# Patient Record
Sex: Female | Born: 1945
Health system: Southern US, Community
[De-identification: ages and names within clinical notes are randomized; demographics above are authoritative.]

## PROBLEM LIST (undated history)

## (undated) DIAGNOSIS — I2510 Atherosclerotic heart disease of native coronary artery without angina pectoris: Secondary | ICD-10-CM

## (undated) DIAGNOSIS — I779 Disorder of arteries and arterioles, unspecified: Secondary | ICD-10-CM

## (undated) DIAGNOSIS — T7840XA Allergy, unspecified, initial encounter: Secondary | ICD-10-CM

## (undated) DIAGNOSIS — E785 Hyperlipidemia, unspecified: Secondary | ICD-10-CM

## (undated) DIAGNOSIS — IMO0002 Reserved for concepts with insufficient information to code with codable children: Secondary | ICD-10-CM

## (undated) DIAGNOSIS — H0230 Blepharochalasis unspecified eye, unspecified eyelid: Secondary | ICD-10-CM

## (undated) DIAGNOSIS — L237 Allergic contact dermatitis due to plants, except food: Secondary | ICD-10-CM

## (undated) DIAGNOSIS — R0600 Dyspnea, unspecified: Secondary | ICD-10-CM

## (undated) DIAGNOSIS — I10 Essential (primary) hypertension: Secondary | ICD-10-CM

## (undated) DIAGNOSIS — M8580 Other specified disorders of bone density and structure, unspecified site: Secondary | ICD-10-CM

## (undated) DIAGNOSIS — M1990 Unspecified osteoarthritis, unspecified site: Secondary | ICD-10-CM

## (undated) HISTORY — DX: Hyperlipidemia, unspecified: E78.5

## (undated) HISTORY — DX: Essential (primary) hypertension: I10

## (undated) HISTORY — PX: HERNIA REPAIR: SHX51

## (undated) HISTORY — DX: Allergic contact dermatitis due to plants, except food: L23.7

## (undated) HISTORY — DX: Unspecified osteoarthritis, unspecified site: M19.90

## (undated) HISTORY — DX: Disorder of arteries and arterioles, unspecified: I77.9

## (undated) HISTORY — DX: Atherosclerotic heart disease of native coronary artery without angina pectoris: I25.10

## (undated) HISTORY — DX: Allergy, unspecified, initial encounter: T78.40XA

## (undated) HISTORY — DX: Reserved for concepts with insufficient information to code with codable children: IMO0002

## (undated) HISTORY — DX: Blepharochalasis unspecified eye, unspecified eyelid: H02.30

## (undated) HISTORY — DX: Other specified disorders of bone density and structure, unspecified site: M85.80

---

## 1968-09-09 HISTORY — PX: TONSILLECTOMY: SUR1361

## 1971-09-10 HISTORY — PX: APPENDECTOMY: SHX54

## 1971-09-10 HISTORY — PX: ABDOMINAL HYSTERECTOMY: SHX81

## 1977-09-09 DIAGNOSIS — IMO0002 Reserved for concepts with insufficient information to code with codable children: Secondary | ICD-10-CM

## 1977-09-09 HISTORY — DX: Reserved for concepts with insufficient information to code with codable children: IMO0002

## 1998-09-09 HISTORY — PX: CHOLECYSTECTOMY: SHX55

## 1998-11-19 ENCOUNTER — Encounter: Payer: Self-pay | Admitting: Internal Medicine

## 1998-11-19 ENCOUNTER — Observation Stay (HOSPITAL_COMMUNITY): Admission: EM | Admit: 1998-11-19 | Discharge: 1998-11-21 | Payer: Self-pay | Admitting: Internal Medicine

## 1999-01-10 ENCOUNTER — Other Ambulatory Visit: Admission: RE | Admit: 1999-01-10 | Discharge: 1999-01-10 | Payer: Self-pay | Admitting: *Deleted

## 2000-01-31 ENCOUNTER — Other Ambulatory Visit: Admission: RE | Admit: 2000-01-31 | Discharge: 2000-01-31 | Payer: Self-pay | Admitting: *Deleted

## 2001-02-05 ENCOUNTER — Other Ambulatory Visit: Admission: RE | Admit: 2001-02-05 | Discharge: 2001-02-05 | Payer: Self-pay | Admitting: *Deleted

## 2002-09-09 HISTORY — PX: COLONOSCOPY: SHX174

## 2002-09-09 HISTORY — PX: HAMMER TOE SURGERY: SHX385

## 2002-11-02 ENCOUNTER — Ambulatory Visit (HOSPITAL_COMMUNITY): Admission: RE | Admit: 2002-11-02 | Discharge: 2002-11-02 | Payer: Self-pay | Admitting: Gastroenterology

## 2010-10-11 ENCOUNTER — Ambulatory Visit (INDEPENDENT_AMBULATORY_CARE_PROVIDER_SITE_OTHER): Payer: Medicare PPO | Admitting: Internal Medicine

## 2010-10-11 ENCOUNTER — Encounter: Payer: Self-pay | Admitting: Internal Medicine

## 2010-10-11 VITALS — BP 140/80 | HR 98 | Temp 98.2°F | Ht 61.0 in | Wt 181.0 lb

## 2010-10-11 DIAGNOSIS — Z13220 Encounter for screening for lipoid disorders: Secondary | ICD-10-CM

## 2010-10-11 DIAGNOSIS — Z23 Encounter for immunization: Secondary | ICD-10-CM

## 2010-10-11 DIAGNOSIS — Z79899 Other long term (current) drug therapy: Secondary | ICD-10-CM | POA: Insufficient documentation

## 2010-10-11 DIAGNOSIS — R635 Abnormal weight gain: Secondary | ICD-10-CM

## 2010-10-11 DIAGNOSIS — M1990 Unspecified osteoarthritis, unspecified site: Secondary | ICD-10-CM

## 2010-10-11 DIAGNOSIS — J069 Acute upper respiratory infection, unspecified: Secondary | ICD-10-CM

## 2010-10-11 DIAGNOSIS — Z0000 Encounter for general adult medical examination without abnormal findings: Secondary | ICD-10-CM

## 2010-10-11 LAB — BASIC METABOLIC PANEL
BUN: 15 mg/dL (ref 6–23)
CO2: 24 mEq/L (ref 19–32)
Calcium: 8.7 mg/dL (ref 8.4–10.5)
Chloride: 105 mEq/L (ref 96–112)
Creatinine, Ser: 0.6 mg/dL (ref 0.4–1.2)
GFR: 110.91 mL/min (ref >60.00)
Glucose, Bld: 111 mg/dL — ABNORMAL HIGH (ref 70–99)
Potassium: 4.5 mEq/L (ref 3.5–5.1)
Sodium: 138 mEq/L (ref 135–145)

## 2010-10-11 LAB — CBC WITH DIFFERENTIAL/PLATELET
Basophils Absolute: 0 10*3/uL (ref 0.0–0.1)
Basophils Relative: 0.3 % (ref 0.0–3.0)
Eosinophils Absolute: 0.1 10*3/uL (ref 0.0–0.7)
Eosinophils Relative: 1 % (ref 0.0–5.0)
HCT: 39.5 % (ref 36.0–46.0)
Hemoglobin: 13.5 g/dL (ref 12.0–15.0)
Lymphocytes Relative: 25.1 % (ref 12.0–46.0)
Lymphs Abs: 2.6 10*3/uL (ref 0.7–4.0)
MCHC: 34.2 g/dL (ref 30.0–36.0)
MCV: 91.5 fl (ref 78.0–100.0)
Monocytes Absolute: 0.8 10*3/uL (ref 0.1–1.0)
Monocytes Relative: 7.6 % (ref 3.0–12.0)
Neutro Abs: 6.8 10*3/uL (ref 1.4–7.7)
Neutrophils Relative %: 66 % (ref 43.0–77.0)
Platelets: 251 10*3/uL (ref 150.0–400.0)
RBC: 4.32 Mil/uL (ref 3.87–5.11)
RDW: 12.4 % (ref 11.5–14.6)
WBC: 10.3 10*3/uL (ref 4.5–10.5)

## 2010-10-11 LAB — LIPID PANEL
Total CHOL/HDL Ratio: 3
Triglycerides: 94 mg/dL (ref 0.0–149.0)

## 2010-10-11 LAB — TSH: TSH: 1.33 u[IU]/mL (ref 0.35–5.50)

## 2010-10-11 MED ORDER — AZITHROMYCIN 250 MG PO TABS: 250.0000 mg | ORAL_TABLET | Freq: Every day | ORAL | Status: AC

## 2010-10-11 MED ORDER — PNEUMOCOCCAL VAC POLYVALENT 25 MCG/0.5ML IJ INJ
0.5000 mL | INJECTION | INTRAMUSCULAR | Status: AC | PRN
  Administered 2010-10-11: 0.5 mL via INTRAMUSCULAR

## 2010-10-11 NOTE — Assessment & Plan Note (Signed)
Obtain CBC and chem7

## 2010-10-11 NOTE — Progress Notes (Addendum)
  Subjective:    Patient ID: Megan Pham, female    DOB: 1946-01-23, 65 y.o.   MRN: [id number]  HPI Patient presents to clinic to establish primary medical care and for initial preventive physical exam Medicare. Reviewed past medical history including hand osteo arthritis for which she uses Aleve when necessary. Does relate remote history of PUD with gastric ulcer. Denies abdominal pain hematemesis hematochezia or melena. Reviewed BMI elevated above thirty. Does do low level exercise with walking and has attempted a modified diet some. Previous smoker now reformed. Underwent hysterectomy for noncancerous indication. Has not had a mammogram in over one year. Has never seen a Pneumovax or a bone density. States was told in the past she was hypothyroid and took medication many years ago. Self DC'd the medication. Does note unintended weight gain. Denies depressive symptoms and no outward evidence of depression. Denies falls or disorder balance. Notes 4d h/o cough productive for clear sputum without f/c. +sick exposure.  Reviewed past medical history, social history, family history, current medications and allergies.  Review of Systems  Constitutional: Positive for unexpected weight change. Negative for fever, chills and activity change.  HENT: Positive for rhinorrhea and postnasal drip. Negative for ear pain, neck pain, neck stiffness and ear discharge.   Eyes: Negative for pain, discharge and redness.  Respiratory: Negative for shortness of breath and wheezing.   Musculoskeletal: Positive for arthralgias. Negative for joint swelling and gait problem.  Skin: Negative for color change and rash.  Psychiatric/Behavioral: Negative for behavioral problems and dysphoric mood.       Objective:   Physical Exam  Constitutional: She appears well-developed and well-nourished.  HENT:  Head: Normocephalic and atraumatic.  Eyes: Conjunctivae, EOM and lids are normal. Pupils are equal, round, and reactive  to light. No scleral icterus.  Neck: No JVD present. Carotid bruit is not present. No rigidity. No edema, no erythema and normal range of motion present. No mass and no thyromegaly present.  Cardiovascular: Normal rate, regular rhythm and normal heart sounds.     No systolic murmur is present  Pulmonary/Chest: Effort normal and breath sounds normal. No respiratory distress.  Abdominal: Soft. Normal appearance and bowel sounds are normal. There is no splenomegaly or hepatomegaly. There is no tenderness.  Musculoskeletal:       Right hand: She exhibits normal range of motion and no bony tenderness.  Lymphadenopathy:    She has no cervical adenopathy.  Neurological: She is alert. She has normal strength. Coordination and gait normal.  Skin: Skin is warm and dry. No rash noted. She is not diaphoretic.  Psychiatric: Her speech is normal and behavior is normal. Judgment and thought content normal. Her mood appears not anxious. She does not exhibit a depressed mood.     Hearing grossly intact bilaterally  Bilateral eye 20/25 Left 20/30 Right 20/50    Assessment & Plan:

## 2010-10-11 NOTE — Assessment & Plan Note (Signed)
Suspect viral etiology at this point. Given abx to hold. Begin if sx do not improve after total of 8-10 days. Follow up if no improvement or worsening.

## 2010-10-11 NOTE — Assessment & Plan Note (Signed)
Given h/o PUD discourage regular nsaid use. Use tylenol prn.

## 2010-10-11 NOTE — Assessment & Plan Note (Signed)
Increase exercise and further dietary modification. Obtain TSH

## 2010-10-11 NOTE — Assessment & Plan Note (Signed)
PMH/PSH/FH/SH and medications reviewed. BMI elevated. Recommend increase of aerobic exercise and further dietary modifications. Depression screen unremarkable. No safety/fall issue noted. Discussed end of life planning issue and patient to consider. Preventive care reviewed. Admin pneumovax and schedule mammogram and BMD. EKG obtained demonstrated NSR with nl axis and intervals. No evidence of arrythmia or ischemic change. Encourage to remain off tobacco.

## 2010-10-11 NOTE — Assessment & Plan Note (Signed)
Obtain lipid profile. 

## 2010-10-16 ENCOUNTER — Other Ambulatory Visit: Payer: Self-pay | Admitting: Internal Medicine

## 2010-10-16 DIAGNOSIS — Z1231 Encounter for screening mammogram for malignant neoplasm of breast: Secondary | ICD-10-CM

## 2010-10-17 ENCOUNTER — Other Ambulatory Visit: Payer: Self-pay | Admitting: Internal Medicine

## 2010-10-17 DIAGNOSIS — M1990 Unspecified osteoarthritis, unspecified site: Secondary | ICD-10-CM

## 2010-10-18 ENCOUNTER — Telehealth: Payer: Self-pay

## 2010-10-18 NOTE — Telephone Encounter (Signed)
Pt aware and verbalized understanding.  

## 2010-10-18 NOTE — Telephone Encounter (Signed)
Message copied by Kyung Rudd on Thu Oct 18, 2010  4:35 PM ------      Message from: Letitia Libra, Maisie Fus      Created: Thu Oct 18, 2010  4:08 PM       Blood sugar slightly above nl. (if was fasting). Decrease intake of sugars and carbs and exerise regularly. Other labs nl

## 2010-10-30 ENCOUNTER — Telehealth: Payer: Self-pay | Admitting: Internal Medicine

## 2010-10-30 NOTE — Telephone Encounter (Signed)
Spoke with pt and she is going to [facility] on Thursday at 1 for the bone density and mammogram. Orders to be faxed to [facility] # [phone] fax [phone]

## 2010-10-30 NOTE — Telephone Encounter (Signed)
[facility] to adv they need an order for bone density test / mammogram (to be performed at their facility).... Please send order to [facility].... # [phone] / fax # [phone].

## 2010-10-31 ENCOUNTER — Ambulatory Visit (HOSPITAL_COMMUNITY): Payer: Medicare PPO

## 2010-10-31 ENCOUNTER — Ambulatory Visit (HOSPITAL_COMMUNITY): Admission: RE | Admit: 2010-10-31 | Payer: Medicare PPO | Source: Ambulatory Visit

## 2010-11-01 ENCOUNTER — Ambulatory Visit (HOSPITAL_COMMUNITY)
Admission: RE | Admit: 2010-11-01 | Discharge: 2010-11-01 | Disposition: A | Discharge status: Home / Self Care | Payer: Medicare PPO | Source: Ambulatory Visit | Attending: Internal Medicine | Admitting: Internal Medicine

## 2010-11-01 DIAGNOSIS — Z1231 Encounter for screening mammogram for malignant neoplasm of breast: Secondary | ICD-10-CM

## 2010-11-01 DIAGNOSIS — M1990 Unspecified osteoarthritis, unspecified site: Secondary | ICD-10-CM

## 2010-11-01 DIAGNOSIS — N951 Menopausal and female climacteric states: Secondary | ICD-10-CM | POA: Insufficient documentation

## 2010-11-01 DIAGNOSIS — Z13820 Encounter for screening for osteoporosis: Secondary | ICD-10-CM | POA: Insufficient documentation

## 2010-11-07 ENCOUNTER — Other Ambulatory Visit: Payer: Self-pay | Admitting: Internal Medicine

## 2010-11-07 DIAGNOSIS — R928 Other abnormal and inconclusive findings on diagnostic imaging of breast: Secondary | ICD-10-CM

## 2010-11-23 ENCOUNTER — Telehealth: Payer: Self-pay

## 2010-11-23 NOTE — Telephone Encounter (Signed)
Test concerns??

## 2010-11-28 NOTE — Telephone Encounter (Signed)
appt made

## 2010-11-28 NOTE — Telephone Encounter (Signed)
Returned call to pt. She states that she is concerned with the mammogram results. She was referred to [facility] by Dr. Rodena Medin for a mam and was told that they found something but didn't know what it was. [facility] where she was told the same thing...they saw something but don't know what it is so they want to keep an eye on it by having her return in 6 mo for another look at it. Pt is extremely concerned stating that she told them, "if you don't know what it is, then you also don't know what it's not." Pt would like some type of answer about what is going on. She notes that 6 months is a long time to wait for something like this.

## 2010-11-28 NOTE — Telephone Encounter (Signed)
Need results. Offer appt to discuss

## 2010-12-06 ENCOUNTER — Ambulatory Visit: Payer: Medicare PPO | Admitting: Internal Medicine

## 2010-12-10 ENCOUNTER — Encounter: Payer: Self-pay | Admitting: Internal Medicine

## 2010-12-20 ENCOUNTER — Encounter: Payer: Self-pay | Admitting: Internal Medicine

## 2010-12-20 ENCOUNTER — Ambulatory Visit (INDEPENDENT_AMBULATORY_CARE_PROVIDER_SITE_OTHER): Payer: Medicare PPO | Admitting: Internal Medicine

## 2010-12-20 VITALS — BP 134/80 | HR 105 | Temp 98.3°F | Wt 178.5 lb

## 2010-12-20 DIAGNOSIS — R928 Other abnormal and inconclusive findings on diagnostic imaging of breast: Secondary | ICD-10-CM

## 2010-12-20 DIAGNOSIS — R29898 Other symptoms and signs involving the musculoskeletal system: Secondary | ICD-10-CM

## 2010-12-20 DIAGNOSIS — M6281 Muscle weakness (generalized): Secondary | ICD-10-CM

## 2010-12-20 DIAGNOSIS — R202 Paresthesia of skin: Secondary | ICD-10-CM

## 2010-12-20 DIAGNOSIS — R209 Unspecified disturbances of skin sensation: Secondary | ICD-10-CM

## 2010-12-23 ENCOUNTER — Encounter: Payer: Self-pay | Admitting: Internal Medicine

## 2010-12-23 DIAGNOSIS — R29898 Other symptoms and signs involving the musculoskeletal system: Secondary | ICD-10-CM | POA: Insufficient documentation

## 2010-12-23 DIAGNOSIS — R928 Other abnormal and inconclusive findings on diagnostic imaging of breast: Secondary | ICD-10-CM | POA: Insufficient documentation

## 2010-12-23 NOTE — Progress Notes (Signed)
  Subjective:    Patient ID: Megan Pham, female    DOB: April 22, 1946, 65 y.o.   MRN: [id number]  HPI Pt presents to clinic for evaluation of abnormal mammogram and right hand discomfort.  Recently underwent abn screening mammogram resulting in diagnostic mammogram and [place]. Results reviewed with patient in detail. No mass was identified and the area of interest was felt to be an asymmetric density of the right breast. Pt denies palpable breast mass and performs exams regularly. Final impression was probably benign birads 3 with recommended 6 month followup. Pt also notes intermittent right hand paresthesias involving 4/5 phalanges. C/o diminished strength in that hand. Recalls remote trauma of right arm and was told in the past had possible pinched nerve of the arm. Denies neck pain or radicular pain.    Reviewed pmh, medications and allergies.     Review of Systems see hpi     Objective:   Physical Exam  Nursing note and vitals reviewed. Constitutional: She appears well-developed and well-nourished. No distress.  HENT:  Head: Normocephalic and atraumatic.  Musculoskeletal: Normal range of motion. She exhibits no tenderness.  Neurological: She is alert.       FROM right arm and hand. No hand muscle wasting. Neg phalens. MCP and intertriginous muscle strength 5/5  Skin: Skin is warm and dry. She is not diaphoretic.          Assessment & Plan:

## 2010-12-23 NOTE — Assessment & Plan Note (Signed)
Subjective weakness not reproduced on exam and associated with paresthesias. ?ulnar nerve involvement. Neurology consult for consideration of NCS/EMG.

## 2010-12-23 NOTE — Assessment & Plan Note (Signed)
Discussed and reviewed in detail with patient. Recommend 85month f/u mammogram and monthly self breast exams.

## 2010-12-28 ENCOUNTER — Telehealth: Payer: Self-pay

## 2010-12-28 NOTE — Telephone Encounter (Signed)
Spoke with pt about bone density report. Per Dr. Rodena Medin, pt has osteopenia or mild bone loss, therefore he recommends she take calcium 1200 units qd and vit d 1000 units qd. Pt is aware and verbalized understanding.

## 2011-01-28 ENCOUNTER — Telehealth: Payer: Self-pay | Admitting: Internal Medicine

## 2011-01-28 NOTE — Telephone Encounter (Signed)
Pt has poison ivy.Pt just had eye surgery and needs to get a script for ointment, cream or med called in to [facility] in [place] asap today. Pt would like to be notified when this has been taken care of.

## 2011-01-28 NOTE — Telephone Encounter (Signed)
Triamcinolone to affected area bid prn #30 gm. Do not apply to eyes or mouth.

## 2011-01-29 ENCOUNTER — Other Ambulatory Visit: Payer: Self-pay

## 2011-01-29 DIAGNOSIS — L237 Allergic contact dermatitis due to plants, except food: Secondary | ICD-10-CM

## 2011-01-29 MED ORDER — TRIAMCINOLONE ACETONIDE 0.025 % EX OINT: TOPICAL_OINTMENT | CUTANEOUS | Status: DC

## 2011-01-29 NOTE — Telephone Encounter (Signed)
done

## 2011-02-15 ENCOUNTER — Encounter: Payer: Self-pay | Admitting: Internal Medicine

## 2011-04-01 ENCOUNTER — Encounter: Payer: Self-pay | Admitting: Internal Medicine

## 2011-04-01 ENCOUNTER — Ambulatory Visit (INDEPENDENT_AMBULATORY_CARE_PROVIDER_SITE_OTHER): Payer: Medicare PPO | Admitting: Internal Medicine

## 2011-04-01 VITALS — BP 110/70 | Temp 98.2°F | Wt 180.0 lb

## 2011-04-01 DIAGNOSIS — L259 Unspecified contact dermatitis, unspecified cause: Secondary | ICD-10-CM

## 2011-04-01 MED ORDER — METHYLPREDNISOLONE ACETATE 80 MG/ML IJ SUSP
80.0000 mg | Freq: Once | INTRAMUSCULAR | Status: AC
  Administered 2011-04-01: 80 mg via INTRAMUSCULAR

## 2011-04-01 NOTE — Progress Notes (Signed)
  Subjective:    Patient ID: Megan Pham, female    DOB: 12/30/1945, 65 y.o.   MRN: [id number]  HPI  65 year old patient who has a history of being extremely sensitive to poison ivy. She presents with a rash quite pruritic involving extremities trunk and upper back after doing some yard work. It is very similar to her episodes of contact dermatitis in the past. She's been using topical triamcinolone without much benefit. She always responded nicely to injections however.    Review of Systems  Skin: Positive for rash.       Objective:   Physical Exam  Skin:       Scattered erythematous slightly raised plaques over the arms left breast anterior chest and back          Assessment & Plan:   Contact dermatitis. Will treat with Depo-Medrol 80 mg IM and clinically observed. This has always been quite helpful she'll use Benadryl when necessary

## 2011-04-01 NOTE — Patient Instructions (Signed)
Benadryl as needed for itching  Call or return to clinic prn if these symptoms worsen or fail to improve as anticipated.

## 2011-04-01 NOTE — Progress Notes (Signed)
Addended by: Duard Brady I on: 04/01/2011 04:51 PM   Modules accepted: Orders

## 2011-04-09 ENCOUNTER — Ambulatory Visit (INDEPENDENT_AMBULATORY_CARE_PROVIDER_SITE_OTHER): Payer: Medicare PPO | Admitting: Internal Medicine

## 2011-04-09 ENCOUNTER — Encounter: Payer: Self-pay | Admitting: Internal Medicine

## 2011-04-09 VITALS — BP 120/80 | Temp 98.0°F | Wt 178.0 lb

## 2011-04-09 DIAGNOSIS — L259 Unspecified contact dermatitis, unspecified cause: Secondary | ICD-10-CM

## 2011-04-09 MED ORDER — PREDNISONE 10 MG PO KIT: 10.0000 mg | PACK | Freq: Two times a day (BID) | ORAL | Status: DC

## 2011-04-09 NOTE — Progress Notes (Signed)
  Subjective:    Patient ID: Megan Pham, female    DOB: May 12, 1946, 65 y.o.   MRN: [id number]  HPI  65 year old patient who is highly allergic to poison ivy. She was treated recently for a contact dermatitis with Depo-Medrol. She is on a number of topical regimens and that continues to be quite symptomatic.    Review of Systems  Skin: Positive for rash.       Objective:   Physical Exam  Constitutional: She appears well-developed and well-nourished. No distress.  Skin:       Patchy areas of the erythema scaling. There is some dry crusted lesions scattered over the abdominal wall area          Assessment & Plan:   No problem-specific assessment & plan notes found for this encounter.   Slowly resolving contact dermatitis. We'll treat with a slow taper of a prednisone Dosepak over the next 12 days

## 2011-04-09 NOTE — Patient Instructions (Signed)
Prednisone Dosepak as directed  Call or return to clinic prn if these symptoms worsen or fail to improve as anticipated.

## 2011-05-21 LAB — HM MAMMOGRAPHY

## 2011-05-22 ENCOUNTER — Encounter: Payer: Self-pay | Admitting: Internal Medicine

## 2011-07-15 ENCOUNTER — Telehealth: Payer: Self-pay | Admitting: Internal Medicine

## 2011-07-15 NOTE — Telephone Encounter (Signed)
Pt would like rx fax to [phone]) for shingles vaccine.

## 2011-07-15 NOTE — Telephone Encounter (Signed)
Done and faxed

## 2011-11-05 ENCOUNTER — Encounter: Payer: Self-pay | Admitting: Internal Medicine

## 2012-02-11 ENCOUNTER — Encounter: Payer: Self-pay | Admitting: Internal Medicine

## 2012-02-11 ENCOUNTER — Ambulatory Visit (INDEPENDENT_AMBULATORY_CARE_PROVIDER_SITE_OTHER): Payer: BC Managed Care – PPO | Admitting: Internal Medicine

## 2012-02-11 VITALS — BP 130/80 | Temp 98.0°F | Wt 172.0 lb

## 2012-02-11 DIAGNOSIS — J069 Acute upper respiratory infection, unspecified: Secondary | ICD-10-CM

## 2012-02-11 DIAGNOSIS — Z8701 Personal history of pneumonia (recurrent): Secondary | ICD-10-CM

## 2012-02-11 NOTE — Patient Instructions (Signed)
It is important that you exercise regularly, at least 20 minutes 3 to 4 times per week.  If you develop chest pain or shortness of breath seek  medical attention.  Call or return to clinic prn if these symptoms worsen or fail to improve as anticipated.  Annual exam as scheduled

## 2012-02-11 NOTE — Progress Notes (Signed)
  Subjective:    Patient ID: Megan Pham, female    DOB: 1946-05-22, 66 y.o.   MRN: [id number]  HPI  66 year old patient who is seen today in followup. While visiting in [facility] she was evaluated and treated for community-acquired pneumonia. She was seen on May 23 and was treated with Levaquin. Today she feels much improved and has completed antibiotic therapy. Only complaint is some mild fatigue and weakness but she seems to be improving. She's had no further cough or fever. Her appetite and by mouth intake has normalized the right ear the level is still diminished.    Review of Systems  Constitutional: Positive for fatigue.  HENT: Negative for hearing loss, congestion, sore throat, rhinorrhea, dental problem, sinus pressure and tinnitus.   Eyes: Negative for pain, discharge and visual disturbance.  Respiratory: Negative for cough and shortness of breath.   Cardiovascular: Negative for chest pain, palpitations and leg swelling.  Gastrointestinal: Negative for nausea, vomiting, abdominal pain, diarrhea, constipation, blood in stool and abdominal distention.  Genitourinary: Negative for dysuria, urgency, frequency, hematuria, flank pain, vaginal bleeding, vaginal discharge, difficulty urinating, vaginal pain and pelvic pain.  Musculoskeletal: Negative for joint swelling, arthralgias and gait problem.  Skin: Negative for rash.  Neurological: Positive for weakness. Negative for dizziness, syncope, speech difficulty, numbness and headaches.  Hematological: Negative for adenopathy.  Psychiatric/Behavioral: Negative for behavioral problems, dysphoric mood and agitation. The patient is not nervous/anxious.        Objective:   Physical Exam  Constitutional: She is oriented to person, place, and time. She appears well-developed and well-nourished.       Obese. No distress. Afebrile. Blood pressure 130/80  HENT:  Head: Normocephalic.  Right Ear: External ear normal.  Left Ear:  External ear normal.  Mouth/Throat: Oropharynx is clear and moist.  Eyes: Conjunctivae and EOM are normal. Pupils are equal, round, and reactive to light.  Neck: Normal range of motion. Neck supple. No thyromegaly present.  Cardiovascular: Normal rate, regular rhythm, normal heart sounds and intact distal pulses.   Pulmonary/Chest: Effort normal and breath sounds normal. No respiratory distress. She has no wheezes. She has no rales. She exhibits no tenderness.       Oxygen saturation 97% Pulse rate 66  Abdominal: Soft. Bowel sounds are normal. She exhibits no mass. There is no tenderness.  Musculoskeletal: Normal range of motion.  Lymphadenopathy:    She has no cervical adenopathy.  Neurological: She is alert and oriented to person, place, and time.  Skin: Skin is warm and dry. No rash noted.  Psychiatric: She has a normal mood and affect. Her behavior is normal.          Assessment & Plan:   History of community-acquired pneumonia. Patient clinically looks quite well her chest is clear Have encouraged patient to slowly resume her usual activities. We'll see as scheduled for her annual exam

## 2012-03-06 ENCOUNTER — Encounter: Payer: Self-pay | Admitting: Internal Medicine

## 2012-03-06 ENCOUNTER — Ambulatory Visit (INDEPENDENT_AMBULATORY_CARE_PROVIDER_SITE_OTHER): Payer: BC Managed Care – PPO | Admitting: Internal Medicine

## 2012-03-06 VITALS — BP 140/74 | Temp 98.4°F | Wt 171.0 lb

## 2012-03-06 DIAGNOSIS — L259 Unspecified contact dermatitis, unspecified cause: Secondary | ICD-10-CM

## 2012-03-06 MED ORDER — TRIAMCINOLONE ACETONIDE 0.1 % EX CREA: TOPICAL_CREAM | Freq: Two times a day (BID) | CUTANEOUS | Status: AC

## 2012-03-06 MED ORDER — PREDNISONE 10 MG PO TABS: ORAL_TABLET | ORAL | Status: DC

## 2012-03-06 MED ORDER — METHYLPREDNISOLONE ACETATE 80 MG/ML IJ SUSP
80.0000 mg | Freq: Once | INTRAMUSCULAR | Status: AC
  Administered 2012-03-06: 80 mg via INTRAMUSCULAR

## 2012-03-06 NOTE — Patient Instructions (Addendum)
Poison Ivy Poison ivy is a inflammation of the skin (contact dermatitis) caused by touching the allergens on the leaves of the ivy plant following previous exposure to the plant. The rash usually appears 48 hours after exposure. The rash is usually bumps (papules) or blisters (vesicles) in a linear pattern. Depending on your own sensitivity, the rash may simply cause redness and itching, or it may also progress to blisters which may break open. These must be well cared for to prevent secondary bacterial (germ) infection, followed by scarring. Keep any open areas dry, clean, dressed, and covered with an antibacterial ointment if needed. The eyes may also get puffy. The puffiness is worst in the morning and gets better as the day progresses. This dermatitis usually heals without scarring, within 2 to 3 weeks without treatment. HOME CARE INSTRUCTIONS  Thoroughly wash with soap and water as soon as you have been exposed to poison ivy. You have about one half hour to remove the plant resin before it will cause the rash. This washing will destroy the oil or antigen on the skin that is causing, or will cause, the rash. Be sure to wash under your fingernails as any plant resin there will continue to spread the rash. Do not rub skin vigorously when washing affected area. Poison ivy cannot spread if no oil from the plant remains on your body. A rash that has progressed to weeping sores will not spread the rash unless you have not washed thoroughly. It is also important to wash any clothes you have been wearing as these may carry active allergens. The rash will return if you wear the unwashed clothing, even several days later. Avoidance of the plant in the future is the best measure. Poison ivy plant can be recognized by the number of leaves. Generally, poison ivy has three leaves with flowering branches on a single stem. Diphenhydramine may be purchased over the counter and used as needed for itching. Do not drive with  this medication if it makes you drowsy.Ask your caregiver about medication for children. SEEK MEDICAL CARE IF:  Open sores develop.   Redness spreads beyond area of rash.   You notice purulent (pus-like) discharge.   You have increased pain.   Other signs of infection develop (such as fever).  Document Released: 08/23/2000 Document Revised: 08/15/2011 Document Reviewed: 07/12/2009 [employer] Patient Information 2012 [employer]. 

## 2012-03-06 NOTE — Progress Notes (Signed)
  Subjective:    Patient ID: [name], female    DOB: Nov 06, 1945, 66 y.o.   MRN: [id number]  HPI  66 year old patient who has the onset of patchy very pruritic areas of dermatitis involving mainly extremities and abdominal wall. Onset after bathing her dog. Patient has had poison ivy in the past and this rash is quite similar    Review of Systems  Skin: Positive for rash.       Objective:   Physical Exam  Skin: Rash noted.       Patchy areas of erythema with some vesicles over the abdominal wall and extremities          Assessment & Plan:   Content dermatitis. Will treat with Depo-Medrol followed by oral prednisone.

## 2012-06-12 ENCOUNTER — Ambulatory Visit (INDEPENDENT_AMBULATORY_CARE_PROVIDER_SITE_OTHER): Payer: BC Managed Care – PPO | Admitting: Internal Medicine

## 2012-06-12 ENCOUNTER — Encounter: Payer: Self-pay | Admitting: Internal Medicine

## 2012-06-12 VITALS — BP 140/90 | Temp 98.1°F | Wt 174.0 lb

## 2012-06-12 DIAGNOSIS — T7840XA Allergy, unspecified, initial encounter: Secondary | ICD-10-CM

## 2012-06-12 DIAGNOSIS — Z888 Allergy status to other drugs, medicaments and biological substances status: Secondary | ICD-10-CM

## 2012-06-12 NOTE — Patient Instructions (Signed)
You  may move around, but avoid painful motions and activities.  Apply ice to the sore area for 15 to 20 minutes 3 or 4 times daily for the next two to 3 days.  Celebrex  200 mg daily  Take [phone]  mg of Tylenol every 6 hours as needed for pain relief or fever.  Avoid taking more than 3000 mg in a 24-hour period (  This may cause liver damage).

## 2012-06-12 NOTE — Progress Notes (Signed)
  Subjective:    Patient ID: Megan Pham, female    DOB: October 23, 1945, 66 y.o.   MRN: [id number]  HPI  66 year old patient who is seen today for flu vaccine at a local medical clinic 2 days ago. Shortly thereafter she began having pain redness and swelling in the right upper arm close to the injection site. She has had some diarrhea achiness in general sense of unwellness.  Past Medical History  Diagnosis Date  . Arthritis   . Ulcer     ulcerative colitis    History   Social History  . Marital Status: Married    Spouse Name: N/A    Number of Children: N/A  . Years of Education: N/A   Occupational History  . retired    Social History Main Topics  . Smoking status: Former Smoker    Quit date: 09/09/2006  . Smokeless tobacco: Not on file  . Alcohol Use: No  . Drug Use: No  . Sexually Active:    Other Topics Concern  . Not on file   Social History Narrative  . No narrative on file    Past Surgical History  Procedure Date  . Cholecystectomy 2000  . Appendectomy 1973  . Tonsillectomy 1970  . Abdominal hysterectomy 1973  . Hammer toe surgery 2004    Family History  Problem Relation Age of Onset  . Arthritis Mother   . Cancer Mother     lung  . Arthritis Father   . Cancer Father     lung  . Arthritis Maternal Aunt   . Cancer Maternal Aunt     breast  . Arthritis Maternal Uncle   . Arthritis Paternal Aunt   . Arthritis Paternal Uncle   . Arthritis Maternal Grandmother   . Arthritis Maternal Grandfather   . Arthritis Paternal Grandmother   . Cancer Paternal Grandmother     breast  . Arthritis Paternal Grandfather     Allergies  Allergen Reactions  . Cefoxitin Sodium In Dextrose Hives    Current Outpatient Prescriptions on File Prior to Visit  Medication Sig Dispense Refill  . cyanocobalamin 2000 MCG tablet Take 2,000 mcg by mouth daily.        . Multiple Vitamin (MULTIVITAMIN) capsule Take 1 capsule by mouth daily.        Marland Kitchen triamcinolone cream  (KENALOG) 0.1 % Apply topically 2 (two) times daily.  30 g  0    BP 140/90  Temp 98.1 F (36.7 C) (Oral)  Wt 174 lb (78.926 kg)       Review of Systems  Constitutional: Negative for fatigue.  Gastrointestinal: Positive for diarrhea.  Neurological: Positive for weakness.       Objective:   Physical Exam  Constitutional: She appears well-developed and well-nourished. No distress.  Skin: Rash noted.       8 cm patchy area of erythema with excess of warmth and tenderness involving her left upper arm area over the biceps muscle. This appeared to be just medial to the injection site of her flu vaccine          Assessment & Plan:   Local allergic reaction to the flu vaccine. Will treat with Tylenol ice elevation and Celebrex Will call there is any clinical deterioration

## 2013-02-19 ENCOUNTER — Encounter: Payer: Self-pay | Admitting: Family Medicine

## 2013-02-19 ENCOUNTER — Ambulatory Visit (INDEPENDENT_AMBULATORY_CARE_PROVIDER_SITE_OTHER): Payer: Medicare Other | Admitting: Family Medicine

## 2013-02-19 VITALS — BP 110/80 | Temp 98.2°F | Wt 178.0 lb

## 2013-02-19 DIAGNOSIS — L259 Unspecified contact dermatitis, unspecified cause: Secondary | ICD-10-CM

## 2013-02-19 DIAGNOSIS — L309 Dermatitis, unspecified: Secondary | ICD-10-CM

## 2013-02-19 MED ORDER — PREDNISONE 20 MG PO TABS: ORAL_TABLET | ORAL | Status: DC

## 2013-02-19 NOTE — Progress Notes (Signed)
Chief Complaint  Patient presents with  . Poison Ivy    HPI:  Skin rash: -thinks poison oak - she has is -she has itchy spots on skin on legs and arms -she has a dog -she was weeding the other day  ROS: See pertinent positives and negatives per HPI.  Past Medical History  Diagnosis Date  . Arthritis   . Ulcer     ulcerative colitis    Family History  Problem Relation Age of Onset  . Arthritis Mother   . Cancer Mother     lung  . Arthritis Father   . Cancer Father     lung  . Arthritis Maternal Aunt   . Cancer Maternal Aunt     breast  . Arthritis Maternal Uncle   . Arthritis Paternal Aunt   . Arthritis Paternal Uncle   . Arthritis Maternal Grandmother   . Arthritis Maternal Grandfather   . Arthritis Paternal Grandmother   . Cancer Paternal Grandmother     breast  . Arthritis Paternal Grandfather     History   Social History  . Marital Status: Married    Spouse Name: N/A    Number of Children: N/A  . Years of Education: N/A   Occupational History  . retired    Social History Main Topics  . Smoking status: Former Smoker    Quit date: 09/09/2006  . Smokeless tobacco: None  . Alcohol Use: No  . Drug Use: No  . Sexually Active:    Other Topics Concern  . None   Social History Narrative  . None    Current outpatient prescriptions:cyanocobalamin 2000 MCG tablet, Take 2,000 mcg by mouth daily.  , Disp: , Rfl: ;  Multiple Vitamin (MULTIVITAMIN) capsule, Take 1 capsule by mouth daily.  , Disp: , Rfl: ;  triamcinolone cream (KENALOG) 0.1 %, Apply topically 2 (two) times daily., Disp: 30 g, Rfl: 0;  predniSONE (DELTASONE) 20 MG tablet, 40 mg daily for 3 days, then 20mg  daily for 3 days then stop, Disp: 9 tablet, Rfl: 0  EXAM:  Filed Vitals:   02/19/13 1334  BP: 110/80  Temp: 98.2 F (36.8 C)    Body mass index is 33.65 kg/(m^2).  GENERAL: vitals reviewed and listed above, alert, oriented, appears well hydrated and in no acute distress  HEENT:  atraumatic, conjunttiva clear, no obvious abnormalities on inspection of external nose and ears  NECK: no obvious masses on inspection  SKIN: few scattered papules on skin - no vesicles  MS: moves all extremities without noticeable abnormality  PSYCH: pleasant and cooperative, no obvious depression or anxiety  ASSESSMENT AND PLAN:  Discussed the following assessment and plan:  Dermatitis - Plan: predniSONE (DELTASONE) 20 MG tablet  -appears to be insect bites - does NOT appear to be poison ivy/oak dermatitis. However pt is adamant she gets this every year and that it is poison ivy and demands steroids.  -discussed more likely insect bite and advised to look for fleas in home, prevention of bug bites when outside, topical steroid -rx for prednisone at her insistence for use if worsens. If worsens should see doctor and call exterminator. Discuss risks. -Patient advised to return or notify a doctor immediately if symptoms worsen or persist or new concerns arise.  There are no Patient Instructions on file for this visit.   Kriste Basque R.

## 2013-02-19 NOTE — Patient Instructions (Signed)
-  inspect home for fleas and insect  -call exterminator if continued problems

## 2013-06-01 ENCOUNTER — Ambulatory Visit: Payer: Medicare Other

## 2013-10-14 ENCOUNTER — Ambulatory Visit (INDEPENDENT_AMBULATORY_CARE_PROVIDER_SITE_OTHER): Payer: Medicare HMO | Admitting: Internal Medicine

## 2013-10-14 ENCOUNTER — Ambulatory Visit: Payer: Medicare Other | Admitting: Internal Medicine

## 2013-10-14 ENCOUNTER — Encounter: Payer: Self-pay | Admitting: Internal Medicine

## 2013-10-14 VITALS — BP 140/84 | HR 86 | Temp 97.9°F | Resp 20 | Ht 60.5 in | Wt 182.0 lb

## 2013-10-14 DIAGNOSIS — Z0000 Encounter for general adult medical examination without abnormal findings: Secondary | ICD-10-CM

## 2013-10-14 DIAGNOSIS — R635 Abnormal weight gain: Secondary | ICD-10-CM

## 2013-10-14 DIAGNOSIS — Z136 Encounter for screening for cardiovascular disorders: Secondary | ICD-10-CM

## 2013-10-14 DIAGNOSIS — R928 Other abnormal and inconclusive findings on diagnostic imaging of breast: Secondary | ICD-10-CM

## 2013-10-14 DIAGNOSIS — M1990 Unspecified osteoarthritis, unspecified site: Secondary | ICD-10-CM

## 2013-10-14 LAB — LIPID PANEL
CHOL/HDL RATIO: 4
CHOLESTEROL: 260 mg/dL — AB (ref 0–200)
HDL: 60.1 mg/dL (ref >39.00)
TRIGLYCERIDES: 136 mg/dL (ref 0.0–149.0)
VLDL: 27.2 mg/dL (ref 0.0–40.0)

## 2013-10-14 LAB — CBC WITH DIFFERENTIAL/PLATELET
BASOS ABS: 0 10*3/uL (ref 0.0–0.1)
Basophils Relative: 0.4 % (ref 0.0–3.0)
EOS ABS: 0 10*3/uL (ref 0.0–0.7)
Eosinophils Relative: 0.5 % (ref 0.0–5.0)
HEMATOCRIT: 43.7 % (ref 36.0–46.0)
HEMOGLOBIN: 14.4 g/dL (ref 12.0–15.0)
LYMPHS ABS: 3.8 10*3/uL (ref 0.7–4.0)
Lymphocytes Relative: 48.3 % — ABNORMAL HIGH (ref 12.0–46.0)
MCHC: 33 g/dL (ref 30.0–36.0)
MCV: 92.7 fl (ref 78.0–100.0)
MONO ABS: 0.5 10*3/uL (ref 0.1–1.0)
MONOS PCT: 6.7 % (ref 3.0–12.0)
NEUTROS ABS: 3.4 10*3/uL (ref 1.4–7.7)
Neutrophils Relative %: 44.1 % (ref 43.0–77.0)
Platelets: 320 10*3/uL (ref 150.0–400.0)
RBC: 4.71 Mil/uL (ref 3.87–5.11)
RDW: 12.3 % (ref 11.5–14.6)
WBC: 7.8 10*3/uL (ref 4.5–10.5)

## 2013-10-14 LAB — COMPREHENSIVE METABOLIC PANEL
ALK PHOS: 112 U/L (ref 39–117)
ALT: 42 U/L — AB (ref 0–35)
AST: 40 U/L — AB (ref 0–37)
Albumin: 4.6 g/dL (ref 3.5–5.2)
BILIRUBIN TOTAL: 0.7 mg/dL (ref 0.3–1.2)
BUN: 10 mg/dL (ref 6–23)
CO2: 27 mEq/L (ref 19–32)
CREATININE: 0.6 mg/dL (ref 0.4–1.2)
Calcium: 9.7 mg/dL (ref 8.4–10.5)
Chloride: 102 mEq/L (ref 96–112)
GFR: 101.76 mL/min (ref >60.00)
Glucose, Bld: 99 mg/dL (ref 70–99)
Potassium: 4.5 mEq/L (ref 3.5–5.1)
SODIUM: 137 meq/L (ref 135–145)
Total Protein: 8 g/dL (ref 6.0–8.3)

## 2013-10-14 LAB — TSH: TSH: 0.33 u[IU]/mL — ABNORMAL LOW (ref 0.35–5.50)

## 2013-10-14 NOTE — Progress Notes (Signed)
Pre-visit discussion using our clinic review tool. No additional management support is needed unless otherwise documented below in the visit note.  

## 2013-10-14 NOTE — Patient Instructions (Signed)
Limit your sodium (Salt) intake    It is important that you exercise regularly, at least 20 minutes 3 to 4 times per week.  If you develop chest pain or shortness of breath seek  medical attention.  You need to lose weight.  Consider a lower calorie diet and regular exercise.Health Maintenance, Female A healthy lifestyle and preventative care can promote health and wellness.  Maintain regular health, dental, and eye exams.  Eat a healthy diet. Foods like vegetables, fruits, whole grains, low-fat dairy products, and lean protein foods contain the nutrients you need without too many calories. Decrease your intake of foods high in solid fats, added sugars, and salt. Get information about a proper diet from your caregiver, if necessary.  Regular physical exercise is one of the most important things you can do for your health. Most adults should get at least 150 minutes of moderate-intensity exercise (any activity that increases your heart rate and causes you to sweat) each week. In addition, most adults need muscle-strengthening exercises on 2 or more days a week.   Maintain a healthy weight. The body mass index (BMI) is a screening tool to identify possible weight problems. It provides an estimate of body fat based on height and weight. Your caregiver can help determine your BMI, and can help you achieve or maintain a healthy weight. For adults 20 years and older:  A BMI below 18.5 is considered underweight.  A BMI of 18.5 to 24.9 is normal.  A BMI of 25 to 29.9 is considered overweight.  A BMI of 30 and above is considered obese.  Maintain normal blood lipids and cholesterol by exercising and minimizing your intake of saturated fat. Eat a balanced diet with plenty of fruits and vegetables. Blood tests for lipids and cholesterol should begin at age 1 and be repeated every 5 years. If your lipid or cholesterol levels are high, you are over 50, or you are a high risk for heart disease, you may  need your cholesterol levels checked more frequently.Ongoing high lipid and cholesterol levels should be treated with medicines if diet and exercise are not effective.  If you smoke, find out from your caregiver how to quit. If you do not use tobacco, do not start.  Lung cancer screening is recommended for adults aged 69 80 years who are at high risk for developing lung cancer because of a history of smoking. Yearly low-dose computed tomography (CT) is recommended for people who have at least a 30-pack-year history of smoking and are a current smoker or have quit within the past 15 years. A pack year of smoking is smoking an average of 1 pack of cigarettes a day for 1 year (for example: 1 pack a day for 30 years or 2 packs a day for 15 years). Yearly screening should continue until the smoker has stopped smoking for at least 15 years. Yearly screening should also be stopped for people who develop a health problem that would prevent them from having lung cancer treatment.  If you are pregnant, do not drink alcohol. If you are breastfeeding, be very cautious about drinking alcohol. If you are not pregnant and choose to drink alcohol, do not exceed 1 drink per day. One drink is considered to be 12 ounces (355 mL) of beer, 5 ounces (148 mL) of wine, or 1.5 ounces (44 mL) of liquor.  Avoid use of street drugs. Do not share needles with anyone. Ask for help if you need support or instructions about  stopping the use of drugs.  High blood pressure causes heart disease and increases the risk of stroke. Blood pressure should be checked at least every 1 to 2 years. Ongoing high blood pressure should be treated with medicines, if weight loss and exercise are not effective.  If you are 47 to 68 years old, ask your caregiver if you should take aspirin to prevent strokes.  Diabetes screening involves taking a blood sample to check your fasting blood sugar level. This should be done once every 3 years, after age 53,  if you are within normal weight and without risk factors for diabetes. Testing should be considered at a younger age or be carried out more frequently if you are overweight and have at least 1 risk factor for diabetes.  Breast cancer screening is essential preventative care for women. You should practice "breast self-awareness." This means understanding the normal appearance and feel of your breasts and may include breast self-examination. Any changes detected, no matter how small, should be reported to a caregiver. Women in their 34s and 30s should have a clinical breast exam (CBE) by a caregiver as part of a regular health exam every 1 to 3 years. After age 46, women should have a CBE every year. Starting at age 80, women should consider having a mammogram (breast X-ray) every year. Women who have a family history of breast cancer should talk to their caregiver about genetic screening. Women at a high risk of breast cancer should talk to their caregiver about having an MRI and a mammogram every year.  Breast cancer gene (BRCA)-related cancer risk assessment is recommended for women who have family members with BRCA-related cancers. BRCA-related cancers include breast, ovarian, tubal, and peritoneal cancers. Having family members with these cancers may be associated with an increased risk for harmful changes (mutations) in the breast cancer genes BRCA1 and BRCA2. Results of the assessment will determine the need for genetic counseling and BRCA1 and BRCA2 testing.  The Pap test is a screening test for cervical cancer. Women should have a Pap test starting at age 8. Between ages 66 and 70, Pap tests should be repeated every 2 years. Beginning at age 47, you should have a Pap test every 3 years as long as the past 3 Pap tests have been normal. If you had a hysterectomy for a problem that was not cancer or a condition that could lead to cancer, then you no longer need Pap tests. If you are between ages 61 and  65, and you have had normal Pap tests going back 10 years, you no longer need Pap tests. If you have had past treatment for cervical cancer or a condition that could lead to cancer, you need Pap tests and screening for cancer for at least 20 years after your treatment. If Pap tests have been discontinued, risk factors (such as a new sexual partner) need to be reassessed to determine if screening should be resumed. Some women have medical problems that increase the chance of getting cervical cancer. In these cases, your caregiver may recommend more frequent screening and Pap tests.  The human papillomavirus (HPV) test is an additional test that may be used for cervical cancer screening. The HPV test looks for the virus that can cause the cell changes on the cervix. The cells collected during the Pap test can be tested for HPV. The HPV test could be used to screen women aged 18 years and older, and should be used in women of any  age who have unclear Pap test results. After the age of 50, women should have HPV testing at the same frequency as a Pap test.  Colorectal cancer can be detected and often prevented. Most routine colorectal cancer screening begins at the age of 28 and continues through age 47. However, your caregiver may recommend screening at an earlier age if you have risk factors for colon cancer. On a yearly basis, your caregiver may provide home test kits to check for hidden blood in the stool. Use of a small camera at the end of a tube, to directly examine the colon (sigmoidoscopy or colonoscopy), can detect the earliest forms of colorectal cancer. Talk to your caregiver about this at age 17, when routine screening begins. Direct examination of the colon should be repeated every 5 to 10 years through age 69, unless early forms of pre-cancerous polyps or small growths are found.  Hepatitis C blood testing is recommended for all people born from [birth year] through 1965 and any individual with known risks  for hepatitis C.  Practice safe sex. Use condoms and avoid high-risk sexual practices to reduce the spread of sexually transmitted infections (STIs). Sexually active women aged 77 and younger should be checked for Chlamydia, which is a common sexually transmitted infection. Older women with new or multiple partners should also be tested for Chlamydia. Testing for other STIs is recommended if you are sexually active and at increased risk.  Osteoporosis is a disease in which the bones lose minerals and strength with aging. This can result in serious bone fractures. The risk of osteoporosis can be identified using a bone density scan. Women ages 84 and over and women at risk for fractures or osteoporosis should discuss screening with their caregivers. Ask your caregiver whether you should be taking a calcium supplement or vitamin D to reduce the rate of osteoporosis.  Menopause can be associated with physical symptoms and risks. Hormone replacement therapy is available to decrease symptoms and risks. You should talk to your caregiver about whether hormone replacement therapy is right for you.  Use sunscreen. Apply sunscreen liberally and repeatedly throughout the day. You should seek shade when your shadow is shorter than you. Protect yourself by wearing long sleeves, pants, a wide-brimmed hat, and sunglasses year round, whenever you are outdoors.  Notify your caregiver of new moles or changes in moles, especially if there is a change in shape or color. Also notify your caregiver if a mole is larger than the size of a pencil eraser.  Stay current with your immunizations. Document Released: 03/11/2011 Document Revised: 12/21/2012 Document Reviewed: 03/11/2011 [facility] Patient Information 2014 [place].   Schedule your colonoscopy to help detect colon cancer.

## 2013-10-14 NOTE — Progress Notes (Signed)
Subjective:    Patient ID: Megan Pham, female    DOB: Dec 03, 1945, 68 y.o.   MRN: [id number]  HPI  68 year old patient who is seen today for a preventive health examination. She does quite well she does have a history of mild arthritis and exogenous obesity. For the past few days she has had a painful nodule involving the left lateral breast. She does have a history of fibrocystic breast disease in the past and has had a number of aspirations.  Family history father died age 27 of lung cancer. Mother died at 51 of lung cancer. 3 sisters in good health. Grandmother and an aunt also had breast cancer  1. Risk factors, based on past  M,S,F history- no significant cardiovascular risk factors  2.  Physical activities: Remains active. She states that activities that [employer] at least twice weekly and walks probably once or twice weekly  3.  Depression/mood: History depression or mood disorder  4.  Hearing: Moderate deficits especially right ear  5.  ADL's: Independent in all aspects of daily living  6.  Fall risk: Low  7.  Home safety: No problems identified  8.  Height weight, and visual acuity; height weight stable no change in visual acuity has a cataract extraction surgery with lens implantation  9.  Counseling: More exercise modest weight loss all encouraged  10. Lab orders based on risk factors: Laboratory update will be reviewed  11. Referral : Needs screening colonoscopy and followup mammogram  12. Care plan: As above  13. Cognitive assessment: Alert and oriented normal affect. No cognitive dysfunction     Past Medical History  Diagnosis Date  . Arthritis   . Ulcer     ulcerative colitis    History   Social History  . Marital Status: Married    Spouse Name: N/A    Number of Children: N/A  . Years of Education: N/A   Occupational History  . retired    Social History Main Topics  . Smoking status: Former Smoker    Quit date: 09/09/2006  . Smokeless tobacco:  Not on file  . Alcohol Use: No  . Drug Use: No  . Sexual Activity:    Other Topics Concern  . Not on file   Social History Narrative  . No narrative on file    Past Surgical History  Procedure Laterality Date  . Cholecystectomy  2000  . Appendectomy  1973  . Tonsillectomy  1970  . Abdominal hysterectomy  1973  . Hammer toe surgery  2004    Family History  Problem Relation Age of Onset  . Arthritis Mother   . Cancer Mother     lung  . Arthritis Father   . Cancer Father     lung  . Arthritis Maternal Aunt   . Cancer Maternal Aunt     breast  . Arthritis Maternal Uncle   . Arthritis Paternal Aunt   . Arthritis Paternal Uncle   . Arthritis Maternal Grandmother   . Arthritis Maternal Grandfather   . Arthritis Paternal Grandmother   . Cancer Paternal Grandmother     breast  . Arthritis Paternal Grandfather     Allergies  Allergen Reactions  . Cefoxitin Sodium In Dextrose Hives    Current Outpatient Prescriptions on File Prior to Visit  Medication Sig Dispense Refill  . cyanocobalamin 2000 MCG tablet Take 2,000 mcg by mouth daily.        . Multiple Vitamin (MULTIVITAMIN) capsule Take 1  capsule by mouth daily.         No current facility-administered medications on file prior to visit.    BP 140/84  Pulse 86  Temp(Src) 97.9 F (36.6 C) (Oral)  Resp 20  Ht 5' 0.5" (1.537 m)  Wt 182 lb (82.555 kg)  BMI 34.95 kg/m2  SpO2 96%       Review of Systems  Constitutional: Negative for fever, appetite change, fatigue and unexpected weight change.  HENT: Negative for congestion, dental problem, ear pain, hearing loss, mouth sores, nosebleeds, sinus pressure, sore throat, tinnitus, trouble swallowing and voice change.   Eyes: Negative for photophobia, pain, redness and visual disturbance.  Respiratory: Negative for cough, chest tightness and shortness of breath.   Cardiovascular: Negative for chest pain, palpitations and leg swelling.  Gastrointestinal:  Negative for nausea, vomiting, abdominal pain, diarrhea, constipation, blood in stool, abdominal distention and rectal pain.  Genitourinary: Negative for dysuria, urgency, frequency, hematuria, flank pain, vaginal bleeding, vaginal discharge, difficulty urinating, genital sores, vaginal pain, menstrual problem and pelvic pain.  Musculoskeletal: Negative for arthralgias, back pain and neck stiffness.  Skin: Negative for rash.       Painful nodule left breast  Neurological: Negative for dizziness, syncope, speech difficulty, weakness, light-headedness, numbness and headaches.  Hematological: Negative for adenopathy. Does not bruise/bleed easily.  Psychiatric/Behavioral: Negative for suicidal ideas, behavioral problems, self-injury, dysphoric mood and agitation. The patient is not nervous/anxious.        Objective:   Physical Exam  Constitutional: She is oriented to person, place, and time. She appears well-developed and well-nourished.  HENT:  Head: Normocephalic and atraumatic.  Right Ear: External ear normal.  Left Ear: External ear normal.  Mouth/Throat: Oropharynx is clear and moist.  Eyes: Conjunctivae and EOM are normal.  Neck: Normal range of motion. Neck supple. No JVD present. No thyromegaly present.  Cardiovascular: Normal rate, regular rhythm, normal heart sounds and intact distal pulses.   No murmur heard. Pulmonary/Chest: Effort normal and breath sounds normal. She has no wheezes. She has no rales.  1 cm tender nodule 3:00 o'clock position left lateral breast  Abdominal: Soft. Bowel sounds are normal. She exhibits no distension and no mass. There is no tenderness. There is no rebound and no guarding.  Musculoskeletal: Normal range of motion. She exhibits no edema and no tenderness.  Neurological: She is alert and oriented to person, place, and time. She has normal reflexes. No cranial nerve deficit. She exhibits normal muscle tone. Coordination normal.  Skin: Skin is warm and  dry. No rash noted.  Psychiatric: She has a normal mood and affect. Her behavior is normal.          Assessment & Plan:    Preventive health examination Exogenous obesity Fibrocystic breast disease. We'll treat with warm compresses and anti-inflammatory medications followup mammogram in 2-3 months  Screening colonoscopy

## 2013-10-15 ENCOUNTER — Encounter: Payer: Self-pay | Admitting: Internal Medicine

## 2013-10-15 LAB — LDL CHOLESTEROL, DIRECT: Direct LDL: 187.4 mg/dL

## 2013-10-22 ENCOUNTER — Telehealth: Payer: Self-pay | Admitting: Internal Medicine

## 2013-10-22 NOTE — Telephone Encounter (Signed)
Spoke to pt told her Cholesterol was 260, needs to be 200 or below. Pt verbalized understanding.

## 2013-10-22 NOTE — Telephone Encounter (Signed)
Pt received paperwork about cholesterol but would like to know what her cholesterol level is and what is normal cholesterol. Pt would like a call

## 2013-11-18 ENCOUNTER — Encounter: Payer: Self-pay | Admitting: Internal Medicine

## 2013-12-01 ENCOUNTER — Telehealth: Payer: Self-pay | Admitting: Internal Medicine

## 2013-12-01 NOTE — Telephone Encounter (Signed)
Spoke to pt told her I spoke to her on 2/6 about her labs and they were normal except cholesterol. Pt said yes,but she got her bill and saw thyroid was done and you did not mention that. Told her all those labs are done with physical and I only comment on the elevated or abnormal labs that Dr. Raliegh Ip tells me. Pt verbalized understanding.

## 2013-12-01 NOTE — Telephone Encounter (Signed)
Please call/notify patient that lab/test/procedure is normal except elevated cholesterol of 260.  Note- patient was notified on 2/ 6 and a heart healthy diet, was mailed

## 2013-12-01 NOTE — Telephone Encounter (Signed)
Please advise 

## 2013-12-01 NOTE — Telephone Encounter (Signed)
Pt states she never received her results from her labs that were done in feb.

## 2014-05-23 ENCOUNTER — Ambulatory Visit (INDEPENDENT_AMBULATORY_CARE_PROVIDER_SITE_OTHER): Payer: Medicare HMO | Admitting: Internal Medicine

## 2014-05-23 ENCOUNTER — Encounter: Payer: Self-pay | Admitting: Internal Medicine

## 2014-05-23 VITALS — BP 130/70 | HR 70 | Temp 97.7°F | Resp 20 | Ht 60.5 in | Wt 176.0 lb

## 2014-05-23 DIAGNOSIS — Z23 Encounter for immunization: Secondary | ICD-10-CM

## 2014-05-23 MED ORDER — NYSTATIN-TRIAMCINOLONE 100000-0.1 UNIT/GM-% EX OINT: 1.0000 "application " | TOPICAL_OINTMENT | Freq: Two times a day (BID) | CUTANEOUS | Status: DC

## 2014-05-23 NOTE — Progress Notes (Signed)
Pre visit review using our clinic review tool, if applicable. No additional management support is needed unless otherwise documented below in the visit note. 

## 2014-05-23 NOTE — Patient Instructions (Signed)

## 2014-05-23 NOTE — Progress Notes (Signed)
Subjective:    Patient ID: Megan Pham, female    DOB: 02-24-1946, 68 y.o.   MRN: [id number]  HPI  68 year old patient who presents with a chief complaint of redness and irritation of the external genitalia.  She has been [place] both in the [place], and also uses an exercise bike.  She feels the irritation is related to her activities at the [employer]  No vaginal discharge.  Symptoms have been present for about 4 weeks  Past Medical History  Diagnosis Date  . Arthritis   . Ulcer     ulcerative colitis    History   Social History  . Marital Status: Married    Spouse Name: N/A    Number of Children: N/A  . Years of Education: N/A   Occupational History  . retired    Social History Main Topics  . Smoking status: Former Smoker    Quit date: 09/09/2006  . Smokeless tobacco: Not on file  . Alcohol Use: No  . Drug Use: No  . Sexual Activity:    Other Topics Concern  . Not on file   Social History Narrative  . No narrative on file    Past Surgical History  Procedure Laterality Date  . Cholecystectomy  2000  . Appendectomy  1973  . Tonsillectomy  1970  . Abdominal hysterectomy  1973  . Hammer toe surgery  2004    Family History  Problem Relation Age of Onset  . Arthritis Mother   . Cancer Mother     lung  . Arthritis Father   . Cancer Father     lung  . Arthritis Maternal Aunt   . Cancer Maternal Aunt     breast  . Arthritis Maternal Uncle   . Arthritis Paternal Aunt   . Arthritis Paternal Uncle   . Arthritis Maternal Grandmother   . Arthritis Maternal Grandfather   . Arthritis Paternal Grandmother   . Cancer Paternal Grandmother     breast  . Arthritis Paternal Grandfather     Allergies  Allergen Reactions  . Cefoxitin Sodium In Dextrose Hives    Current Outpatient Prescriptions on File Prior to Visit  Medication Sig Dispense Refill  . cyanocobalamin 2000 MCG tablet Take 2,000 mcg by mouth daily.        . Multiple Vitamin  (MULTIVITAMIN) capsule Take 1 capsule by mouth daily.         No current facility-administered medications on file prior to visit.    BP 130/70  Pulse 70  Temp(Src) 97.7 F (36.5 C) (Oral)  Resp 20  Ht 5' 0.5" (1.537 m)  Wt 176 lb (79.833 kg)  BMI 33.79 kg/m2  SpO2 98%     Review of Systems  Constitutional: Negative.   HENT: Negative for congestion, dental problem, hearing loss, rhinorrhea, sinus pressure, sore throat and tinnitus.   Eyes: Negative for pain, discharge and visual disturbance.  Respiratory: Negative for cough and shortness of breath.   Cardiovascular: Negative for chest pain, palpitations and leg swelling.  Gastrointestinal: Negative for nausea, vomiting, abdominal pain, diarrhea, constipation, blood in stool and abdominal distention.  Genitourinary: Negative for dysuria, urgency, frequency, hematuria, flank pain, vaginal bleeding, vaginal discharge, difficulty urinating, vaginal pain and pelvic pain.  Musculoskeletal: Negative for arthralgias, gait problem and joint swelling.  Skin: Positive for rash.  Neurological: Negative for dizziness, syncope, speech difficulty, weakness, numbness and headaches.  Hematological: Negative for adenopathy.  Psychiatric/Behavioral: Negative for  behavioral problems, dysphoric mood and agitation. The patient is not nervous/anxious.        Objective:   Physical Exam  Constitutional: She appears well-developed and well-nourished. No distress.  Genitourinary: Vagina normal. No vaginal discharge found.  External genitalia erythematous No discharge  Skin:  Small 5 mm noninflamed papule, right lateral mid back area          Assessment & Plan:   Vulvitis.  Probably related to exposure to chlorine.  Possible yeast infection related to friction and moisture.  Poor ventilation, etc. we'll treat with nystatin, triamcinolone cream

## 2014-10-26 ENCOUNTER — Ambulatory Visit (INDEPENDENT_AMBULATORY_CARE_PROVIDER_SITE_OTHER): Payer: Medicare HMO | Admitting: Internal Medicine

## 2014-10-26 ENCOUNTER — Encounter: Payer: Self-pay | Admitting: Internal Medicine

## 2014-10-26 VITALS — BP 150/80 | HR 88 | Temp 98.0°F | Resp 20 | Ht 60.5 in | Wt 180.0 lb

## 2014-10-26 DIAGNOSIS — N390 Urinary tract infection, site not specified: Secondary | ICD-10-CM

## 2014-10-26 DIAGNOSIS — R300 Dysuria: Secondary | ICD-10-CM | POA: Diagnosis not present

## 2014-10-26 DIAGNOSIS — R319 Hematuria, unspecified: Secondary | ICD-10-CM

## 2014-10-26 LAB — POCT URINALYSIS DIPSTICK
BILIRUBIN UA: NEGATIVE
GLUCOSE UA: NEGATIVE
KETONES UA: NEGATIVE
NITRITE UA: NEGATIVE
Protein, UA: NEGATIVE
RBC UA: NEGATIVE
Urobilinogen, UA: 0.2
pH, UA: 6

## 2014-10-26 MED ORDER — CIPROFLOXACIN HCL 500 MG PO TABS: 500.0000 mg | ORAL_TABLET | Freq: Two times a day (BID) | ORAL | Status: DC

## 2014-10-26 NOTE — Progress Notes (Signed)
Pre visit review using our clinic review tool, if applicable. No additional management support is needed unless otherwise documented below in the visit note. 

## 2014-10-26 NOTE — Progress Notes (Signed)
Subjective:    Patient ID: Megan Pham, female    DOB: Oct 12, 1945, 69 y.o.   MRN: [id number]  HPI  69 year old patient who presents with a several day history of burning dysuria.  No fever, chills, or flank pain.  She has had infrequent UTIs in the past  Past Medical History  Diagnosis Date  . Arthritis   . Ulcer     ulcerative colitis    History   Social History  . Marital Status: Married    Spouse Name: N/A  . Number of Children: N/A  . Years of Education: N/A   Occupational History  . retired    Social History Main Topics  . Smoking status: Former Smoker    Quit date: 09/09/2006  . Smokeless tobacco: Not on file  . Alcohol Use: No  . Drug Use: No  . Sexual Activity: Not on file   Other Topics Concern  . Not on file   Social History Narrative    Past Surgical History  Procedure Laterality Date  . Cholecystectomy  2000  . Appendectomy  1973  . Tonsillectomy  1970  . Abdominal hysterectomy  1973  . Hammer toe surgery  2004    Family History  Problem Relation Age of Onset  . Arthritis Mother   . Cancer Mother     lung  . Arthritis Father   . Cancer Father     lung  . Arthritis Maternal Aunt   . Cancer Maternal Aunt     breast  . Arthritis Maternal Uncle   . Arthritis Paternal Aunt   . Arthritis Paternal Uncle   . Arthritis Maternal Grandmother   . Arthritis Maternal Grandfather   . Arthritis Paternal Grandmother   . Cancer Paternal Grandmother     breast  . Arthritis Paternal Grandfather     Allergies  Allergen Reactions  . Cefoxitin Sodium In Dextrose Hives    Current Outpatient Prescriptions on File Prior to Visit  Medication Sig Dispense Refill  . cyanocobalamin 2000 MCG tablet Take 2,000 mcg by mouth daily.      . Multiple Vitamin (MULTIVITAMIN) capsule Take 1 capsule by mouth daily.      Marland Kitchen nystatin-triamcinolone ointment (MYCOLOG) Apply 1 application topically 2 (two) times daily. (Patient taking differently: Apply 1  application topically 2 (two) times daily as needed. ) 60 g 1   No current facility-administered medications on file prior to visit.    BP 150/80 mmHg  Pulse 88  Temp(Src) 98 F (36.7 C) (Oral)  Resp 20  Ht 5' 0.5" (1.537 m)  Wt 180 lb (81.647 kg)  BMI 34.56 kg/m2  SpO2 98%     Review of Systems  Constitutional: Negative.   HENT: Negative for congestion, dental problem, hearing loss, rhinorrhea, sinus pressure, sore throat and tinnitus.   Eyes: Negative for pain, discharge and visual disturbance.  Respiratory: Negative for cough and shortness of breath.   Cardiovascular: Negative for chest pain, palpitations and leg swelling.  Gastrointestinal: Negative for nausea, vomiting, abdominal pain, diarrhea, constipation, blood in stool and abdominal distention.  Genitourinary: Positive for dysuria. Negative for urgency, frequency, hematuria, flank pain, vaginal bleeding, vaginal discharge, difficulty urinating, vaginal pain and pelvic pain.  Musculoskeletal: Negative for joint swelling, arthralgias and gait problem.  Skin: Negative for rash.  Neurological: Negative for dizziness, syncope, speech difficulty, weakness, numbness and headaches.  Hematological: Negative for adenopathy.  Psychiatric/Behavioral: Negative for behavioral problems, dysphoric mood and agitation. The patient is not nervous/anxious.  Objective:   Physical Exam  Constitutional: She is oriented to person, place, and time. She appears well-developed and well-nourished.  HENT:  Head: Normocephalic.  Right Ear: External ear normal.  Left Ear: External ear normal.  Mouth/Throat: Oropharynx is clear and moist.  Eyes: Conjunctivae and EOM are normal. Pupils are equal, round, and reactive to light.  Neck: Normal range of motion. Neck supple. No thyromegaly present.  Cardiovascular: Normal rate, regular rhythm, normal heart sounds and intact distal pulses.   Pulmonary/Chest: Effort normal and breath sounds  normal.  Abdominal: Soft. Bowel sounds are normal. She exhibits no mass. There is tenderness.  Mild suprapubic tenderness  Musculoskeletal: Normal range of motion.  Lymphadenopathy:    She has no cervical adenopathy.  Neurological: She is alert and oriented to person, place, and time.  Skin: Skin is warm and dry. No rash noted.  Psychiatric: She has a normal mood and affect. Her behavior is normal.          Assessment & Plan:   Acute UTI.  Will treat with Cipro for 3 days Patient instructions dispensed  We'll schedule CPX at her convenience

## 2014-10-26 NOTE — Patient Instructions (Signed)

## 2015-01-24 ENCOUNTER — Encounter: Payer: Self-pay | Admitting: Internal Medicine

## 2015-01-24 ENCOUNTER — Ambulatory Visit (INDEPENDENT_AMBULATORY_CARE_PROVIDER_SITE_OTHER): Payer: Medicare HMO | Admitting: Internal Medicine

## 2015-01-24 VITALS — BP 138/70 | HR 72 | Temp 98.0°F | Resp 20 | Ht 60.5 in | Wt 181.0 lb

## 2015-01-24 DIAGNOSIS — R238 Other skin changes: Secondary | ICD-10-CM

## 2015-01-24 DIAGNOSIS — R300 Dysuria: Secondary | ICD-10-CM

## 2015-01-24 DIAGNOSIS — D2322 Other benign neoplasm of skin of left ear and external auricular canal: Secondary | ICD-10-CM

## 2015-01-24 DIAGNOSIS — M545 Low back pain, unspecified: Secondary | ICD-10-CM

## 2015-01-24 DIAGNOSIS — N952 Postmenopausal atrophic vaginitis: Secondary | ICD-10-CM | POA: Diagnosis not present

## 2015-01-24 LAB — POCT URINALYSIS DIPSTICK
BILIRUBIN UA: NEGATIVE
GLUCOSE UA: NEGATIVE
KETONES UA: NEGATIVE
Nitrite, UA: NEGATIVE
Protein, UA: NEGATIVE
RBC UA: 1
Urobilinogen, UA: 0.2
pH, UA: 5.5

## 2015-01-24 MED ORDER — ESTRADIOL 0.1 MG/GM VA CREA: 1.0000 | TOPICAL_CREAM | VAGINAL | Status: DC

## 2015-01-24 NOTE — Patient Instructions (Addendum)
Vaginal cream three times weekly for one month, then once or twice weekly  Call or return to clinic prn if these symptoms worsen or fail to improve as anticipated.   Atrophic Vaginitis Atrophic vaginitis is a problem of low levels of estrogen in women. This problem can happen at any age. It is most common in women who have gone through menopause ("the change").  HOW WILL I KNOW IF I HAVE THIS PROBLEM? You may have:  Trouble with peeing (urinating), such as:  Going to the bathroom often.  A hard time holding your pee until you reach a bathroom.  Leaking pee.  Having pain when you pee.  Itching or a burning feeling.  Vaginal bleeding and spotting.  Pain during sex.  Dryness of the vagina.  A yellow, bad-smelling fluid (discharge) coming from the vagina. HOW WILL MY DOCTOR CHECK FOR THIS PROBLEM?  During your exam, your doctor will likely find the problem.  If there is a vaginal fluid, it may be checked for infection. HOW WILL THIS PROBLEM BE TREATED? Keep the vulvar skin as clean as possible. Moisturizers and lubricants can help with some of the symptoms. Estrogen replacement can help. There are 2 ways to take estrogen:  Systemic estrogen gets estrogen to your whole body. It takes many weeks or months before the symptoms get better.  You take an estrogen pill.  You use a skin patch. This is a patch that you put on your skin.  If you still have your uterus, your doctor may ask you to take a hormone. Talk to your doctor about the right medicine for you.  Estrogen cream.  This puts estrogen only at the part of your body where you apply it. The cream is put into the vagina or put on the vulvar skin. For some women, estrogen cream works faster than pills or the patch. CAN ALL WOMEN WITH THIS PROBLEM USE ESTROGEN? No. Women with certain types of cancer, liver problems, or problems with blood clots should not take estrogen. Your doctor can help you decide the best treatment for  your symptoms. Document Released: 02/12/2008 Document Revised: 08/31/2013 Document Reviewed: 02/12/2008 [facility] Patient Information 2015 [place], [place]. This information is not intended to replace advice given to you by your health care provider. Make sure you discuss any questions you have with your health care provider.   Dermatology follow-up as discussed

## 2015-01-24 NOTE — Progress Notes (Signed)
Subjective:    Patient ID: Megan Pham, female    DOB: 06/18/1946, 69 y.o.   MRN: [id number]  HPI  69 year old patient who presents with burning dysuria, lower abdominal discomfort as well as back pain.  She has had some urinary frequency. Urinalysis was reviewed today and was essentially normal. Gynecologic examination in the fall did reveal some atrophic changes  Past Medical History  Diagnosis Date  . Arthritis   . Ulcer     ulcerative colitis    History   Social History  . Marital Status: Married    Spouse Name: N/A  . Number of Children: N/A  . Years of Education: N/A   Occupational History  . retired    Social History Main Topics  . Smoking status: Former Smoker    Quit date: 09/09/2006  . Smokeless tobacco: Not on file  . Alcohol Use: No  . Drug Use: No  . Sexual Activity: Not on file   Other Topics Concern  . Not on file   Social History Narrative    Past Surgical History  Procedure Laterality Date  . Cholecystectomy  2000  . Appendectomy  1973  . Tonsillectomy  1970  . Abdominal hysterectomy  1973  . Hammer toe surgery  2004    Family History  Problem Relation Age of Onset  . Arthritis Mother   . Cancer Mother     lung  . Arthritis Father   . Cancer Father     lung  . Arthritis Maternal Aunt   . Cancer Maternal Aunt     breast  . Arthritis Maternal Uncle   . Arthritis Paternal Aunt   . Arthritis Paternal Uncle   . Arthritis Maternal Grandmother   . Arthritis Maternal Grandfather   . Arthritis Paternal Grandmother   . Cancer Paternal Grandmother     breast  . Arthritis Paternal Grandfather     Allergies  Allergen Reactions  . Cefoxitin Sodium In Dextrose Hives    Current Outpatient Prescriptions on File Prior to Visit  Medication Sig Dispense Refill  . cyanocobalamin 2000 MCG tablet Take 2,000 mcg by mouth daily.      Marland Kitchen nystatin-triamcinolone ointment (MYCOLOG) Apply 1 application topically 2 (two) times daily. (Patient  taking differently: Apply 1 application topically 2 (two) times daily as needed. ) 60 g 1   No current facility-administered medications on file prior to visit.    BP 138/70 mmHg  Pulse 72  Temp(Src) 98 F (36.7 C) (Oral)  Resp 20  Ht 5' 0.5" (1.537 m)  Wt 181 lb (82.101 kg)  BMI 34.75 kg/m2  SpO2 97%       Review of Systems  Constitutional: Negative.   HENT: Negative for congestion, dental problem, hearing loss, rhinorrhea, sinus pressure, sore throat and tinnitus.   Eyes: Negative for pain, discharge and visual disturbance.  Respiratory: Negative for cough and shortness of breath.   Cardiovascular: Negative for chest pain, palpitations and leg swelling.  Gastrointestinal: Negative for nausea, vomiting, abdominal pain, diarrhea, constipation, blood in stool and abdominal distention.  Genitourinary: Positive for dysuria, frequency and pelvic pain. Negative for urgency, hematuria, flank pain, vaginal bleeding, vaginal discharge, difficulty urinating and vaginal pain.  Musculoskeletal: Negative for joint swelling, arthralgias and gait problem.  Skin: Negative for rash.  Neurological: Negative for dizziness, syncope, speech difficulty, weakness, numbness and headaches.  Hematological: Negative for adenopathy.  Psychiatric/Behavioral: Negative for behavioral problems, dysphoric mood and agitation. The patient is not nervous/anxious.  Objective:   Physical Exam  Constitutional: She is oriented to person, place, and time. She appears well-developed and well-nourished.  HENT:  Head: Normocephalic.  Right Ear: External ear normal.  Left Ear: External ear normal.  Mouth/Throat: Oropharynx is clear and moist.  Eyes: Conjunctivae and EOM are normal. Pupils are equal, round, and reactive to light.  Neck: Normal range of motion. Neck supple. No thyromegaly present.  Cardiovascular: Normal rate, regular rhythm, normal heart sounds and intact distal pulses.   Pulmonary/Chest:  Effort normal and breath sounds normal.  Abdominal: Soft. Bowel sounds are normal. She exhibits no mass. There is no tenderness.  Musculoskeletal: Normal range of motion.  Lymphadenopathy:    She has no cervical adenopathy.  Neurological: She is alert and oriented to person, place, and time.  Skin: Skin is warm and dry. No rash noted.  4-5 mm papule involving the left postauricular area  Psychiatric: She has a normal mood and affect. Her behavior is normal.          Assessment & Plan:   Urogenital atrophy.  Oh place on topical hormone therapy and observe. Nonspecific papule.  Left postauricular area.  Will set her for dermatologic evaluation to rule out BCE  CPX 6 months

## 2015-01-24 NOTE — Progress Notes (Signed)
Pre visit review using our clinic review tool, if applicable. No additional management support is needed unless otherwise documented below in the visit note. 

## 2015-02-01 ENCOUNTER — Telehealth: Payer: Self-pay | Admitting: Family Medicine

## 2015-02-01 NOTE — Telephone Encounter (Signed)
Tried to reach the pt to see if she has had her annual mammogram.  No machine at home number.

## 2015-02-01 NOTE — Telephone Encounter (Signed)
Has not had a mammo this year. Does not feel that she needs them every year. Her one last year was normal.

## 2015-02-01 NOTE — Telephone Encounter (Signed)
Chart updated

## 2015-05-30 ENCOUNTER — Ambulatory Visit (INDEPENDENT_AMBULATORY_CARE_PROVIDER_SITE_OTHER): Payer: Medicare HMO | Admitting: *Deleted

## 2015-05-30 DIAGNOSIS — Z23 Encounter for immunization: Secondary | ICD-10-CM

## 2015-07-07 DIAGNOSIS — R69 Illness, unspecified: Secondary | ICD-10-CM | POA: Diagnosis not present

## 2015-10-03 ENCOUNTER — Ambulatory Visit: Payer: Medicare HMO | Admitting: Family Medicine

## 2015-10-04 ENCOUNTER — Ambulatory Visit (INDEPENDENT_AMBULATORY_CARE_PROVIDER_SITE_OTHER): Payer: Medicare HMO | Admitting: Internal Medicine

## 2015-10-04 ENCOUNTER — Encounter: Payer: Self-pay | Admitting: Internal Medicine

## 2015-10-04 VITALS — BP 150/70 | HR 78 | Temp 98.4°F | Resp 20 | Ht 60.5 in | Wt 178.0 lb

## 2015-10-04 DIAGNOSIS — M545 Low back pain, unspecified: Secondary | ICD-10-CM

## 2015-10-04 DIAGNOSIS — R300 Dysuria: Secondary | ICD-10-CM

## 2015-10-04 LAB — POCT URINALYSIS DIPSTICK
Bilirubin, UA: NEGATIVE
Glucose, UA: NEGATIVE
Ketones, UA: NEGATIVE
Nitrite, UA: NEGATIVE
PH UA: 6.5
PROTEIN UA: NEGATIVE
UROBILINOGEN UA: 0.2

## 2015-10-04 MED ORDER — NITROFURANTOIN MONOHYD MACRO 100 MG PO CAPS: 100.0000 mg | ORAL_CAPSULE | Freq: Two times a day (BID) | ORAL | Status: DC

## 2015-10-04 NOTE — Patient Instructions (Signed)
Take your antibiotic as prescribed until ALL of it is gone, but stop if you develop a rash, swelling, or any side effects of the medication.  Contact our office as soon as possible if  there are side effects of the medication.  Drink as much fluid as you  can tolerate over the next few days

## 2015-10-04 NOTE — Progress Notes (Signed)
Subjective:    Patient ID: Megan Pham, female    DOB: 07/25/1946, 70 y.o.   MRN: [id number]  HPI  70 year old patient who presents with a several day history of suprapubic discomfort, low back pain, burning dysuria.  Is also noted her urine to be much darker and foul-smelling.  She has had UTIs in the past with similar symptoms. She has been unable to give a urine specimen at the office today area.  No fever, chills or flank pain  Past Medical History  Diagnosis Date  . Arthritis   . Ulcer     ulcerative colitis    Social History   Social History  . Marital Status: Married    Spouse Name: N/A  . Number of Children: N/A  . Years of Education: N/A   Occupational History  . retired    Social History Main Topics  . Smoking status: Former Smoker    Quit date: 09/09/2006  . Smokeless tobacco: Not on file  . Alcohol Use: No  . Drug Use: No  . Sexual Activity: Not on file   Other Topics Concern  . Not on file   Social History Narrative    Past Surgical History  Procedure Laterality Date  . Cholecystectomy  2000  . Appendectomy  1973  . Tonsillectomy  1970  . Abdominal hysterectomy  1973  . Hammer toe surgery  2004    Family History  Problem Relation Age of Onset  . Arthritis Mother   . Cancer Mother     lung  . Arthritis Father   . Cancer Father     lung  . Arthritis Maternal Aunt   . Cancer Maternal Aunt     breast  . Arthritis Maternal Uncle   . Arthritis Paternal Aunt   . Arthritis Paternal Uncle   . Arthritis Maternal Grandmother   . Arthritis Maternal Grandfather   . Arthritis Paternal Grandmother   . Cancer Paternal Grandmother     breast  . Arthritis Paternal Grandfather     Allergies  Allergen Reactions  . Cefoxitin Sodium In Dextrose Hives    Current Outpatient Prescriptions on File Prior to Visit  Medication Sig Dispense Refill  . acetaminophen (TYLENOL) 650 MG CR tablet Take 1,300 mg by mouth every morning.    . Multiple  Vitamins-Minerals (CENTRUM SILVER ADULT 50+) TABS Take 1 tablet by mouth daily.    Marland Kitchen OVER THE COUNTER MEDICATION Take 1,000 mg by mouth at bedtime. EQUATE ACETAMINOPHEN PM     No current facility-administered medications on file prior to visit.    BP 150/70 mmHg  Pulse 78  Temp(Src) 98.4 F (36.9 C) (Oral)  Resp 20  Ht 5' 0.5" (1.537 m)  Wt 178 lb (80.74 kg)  BMI 34.18 kg/m2  SpO2 98%     Review of Systems  Constitutional: Negative.   HENT: Negative for congestion, dental problem, hearing loss, rhinorrhea, sinus pressure, sore throat and tinnitus.   Eyes: Negative for pain, discharge and visual disturbance.  Respiratory: Negative for cough and shortness of breath.   Cardiovascular: Negative for chest pain, palpitations and leg swelling.  Gastrointestinal: Negative for nausea, vomiting, abdominal pain, diarrhea, constipation, blood in stool and abdominal distention.  Genitourinary: Positive for dysuria, urgency and frequency. Negative for hematuria, flank pain, vaginal bleeding, vaginal discharge, difficulty urinating, vaginal pain and pelvic pain.  Musculoskeletal: Negative for joint swelling, arthralgias and gait problem.  Skin: Negative for rash.  Neurological: Negative for dizziness, syncope, speech difficulty,  weakness, numbness and headaches.  Hematological: Negative for adenopathy.  Psychiatric/Behavioral: Negative for behavioral problems, dysphoric mood and agitation. The patient is not nervous/anxious.        Objective:   Physical Exam  Constitutional: She appears well-developed and well-nourished. No distress.  Abdominal: Soft. She exhibits no distension. There is no tenderness.          Assessment & Plan:   Acute UTI.  Will treat with Macrobid for 7 days Recheck UA at the time of her annual exam in 2 weeks

## 2015-10-04 NOTE — Progress Notes (Signed)
Pre visit review using our clinic review tool, if applicable. No additional management support is needed unless otherwise documented below in the visit note. 

## 2015-10-16 ENCOUNTER — Encounter: Payer: Self-pay | Admitting: Internal Medicine

## 2015-10-16 ENCOUNTER — Ambulatory Visit (INDEPENDENT_AMBULATORY_CARE_PROVIDER_SITE_OTHER): Payer: Medicare HMO | Admitting: Internal Medicine

## 2015-10-16 VITALS — BP 150/76 | HR 70 | Temp 98.3°F | Resp 20 | Ht 60.5 in | Wt 180.0 lb

## 2015-10-16 DIAGNOSIS — Z0000 Encounter for general adult medical examination without abnormal findings: Secondary | ICD-10-CM

## 2015-10-16 DIAGNOSIS — M159 Polyosteoarthritis, unspecified: Secondary | ICD-10-CM

## 2015-10-16 DIAGNOSIS — R7989 Other specified abnormal findings of blood chemistry: Secondary | ICD-10-CM

## 2015-10-16 DIAGNOSIS — E785 Hyperlipidemia, unspecified: Secondary | ICD-10-CM | POA: Diagnosis not present

## 2015-10-16 DIAGNOSIS — M150 Primary generalized (osteo)arthritis: Secondary | ICD-10-CM

## 2015-10-16 DIAGNOSIS — Z23 Encounter for immunization: Secondary | ICD-10-CM

## 2015-10-16 DIAGNOSIS — M8949 Other hypertrophic osteoarthropathy, multiple sites: Secondary | ICD-10-CM

## 2015-10-16 LAB — POC URINALSYSI DIPSTICK (AUTOMATED)
Bilirubin, UA: NEGATIVE
Glucose, UA: NEGATIVE
Ketones, UA: NEGATIVE
NITRITE UA: NEGATIVE
PH UA: 5.5
PROTEIN UA: NEGATIVE
UROBILINOGEN UA: 0.2

## 2015-10-16 LAB — CBC WITH DIFFERENTIAL/PLATELET
BASOS ABS: 0 10*3/uL (ref 0.0–0.1)
BASOS PCT: 0.6 % (ref 0.0–3.0)
EOS ABS: 0.2 10*3/uL (ref 0.0–0.7)
Eosinophils Relative: 3.4 % (ref 0.0–5.0)
HCT: 43.1 % (ref 36.0–46.0)
Hemoglobin: 14.2 g/dL (ref 12.0–15.0)
LYMPHS PCT: 44.4 % (ref 12.0–46.0)
Lymphs Abs: 3.2 10*3/uL (ref 0.7–4.0)
MCHC: 32.8 g/dL (ref 30.0–36.0)
MCV: 92 fl (ref 78.0–100.0)
MONO ABS: 0.5 10*3/uL (ref 0.1–1.0)
Monocytes Relative: 7.1 % (ref 3.0–12.0)
NEUTROS ABS: 3.2 10*3/uL (ref 1.4–7.7)
Neutrophils Relative %: 44.5 % (ref 43.0–77.0)
PLATELETS: 341 10*3/uL (ref 150.0–400.0)
RBC: 4.69 Mil/uL (ref 3.87–5.11)
RDW: 12.8 % (ref 11.5–15.5)
WBC: 7.1 10*3/uL (ref 4.0–10.5)

## 2015-10-16 LAB — COMPREHENSIVE METABOLIC PANEL
ALBUMIN: 4.4 g/dL (ref 3.5–5.2)
ALT: 26 U/L (ref 0–35)
AST: 23 U/L (ref 0–37)
Alkaline Phosphatase: 85 U/L (ref 39–117)
BUN: 11 mg/dL (ref 6–23)
CALCIUM: 9.5 mg/dL (ref 8.4–10.5)
CHLORIDE: 104 meq/L (ref 96–112)
CO2: 24 meq/L (ref 19–32)
CREATININE: 0.57 mg/dL (ref 0.40–1.20)
GFR: 111.47 mL/min (ref >60.00)
Glucose, Bld: 111 mg/dL — ABNORMAL HIGH (ref 70–99)
Potassium: 3.7 mEq/L (ref 3.5–5.1)
SODIUM: 139 meq/L (ref 135–145)
Total Bilirubin: 0.4 mg/dL (ref 0.2–1.2)
Total Protein: 7.6 g/dL (ref 6.0–8.3)

## 2015-10-16 LAB — LIPID PANEL
CHOLESTEROL: 209 mg/dL — AB (ref 0–200)
HDL: 61.5 mg/dL (ref >39.00)
LDL CALC: 121 mg/dL — AB (ref 0–99)
NonHDL: 147.52
TRIGLYCERIDES: 132 mg/dL (ref 0.0–149.0)
Total CHOL/HDL Ratio: 3
VLDL: 26.4 mg/dL (ref 0.0–40.0)

## 2015-10-16 LAB — TSH: TSH: 1.06 u[IU]/mL (ref 0.35–4.50)

## 2015-10-16 NOTE — Patient Instructions (Signed)
Schedule your colonoscopy to help detect colon cancer.  Schedule your mammogram.  Take a calcium supplement, plus [phone] units of vitamin D    It is important that you exercise regularly, at least 20 minutes 3 to 4 times per week.  If you develop chest pain or shortness of breath seek  medical attention.  You need to lose weight.  Consider a lower calorie diet and regular exercise.    It is important that you exercise regularly, at least 20 minutes 3 to 4 times per week.  If you develop chest pain or shortness of breath seek  medical attention.  Health Maintenance, Female Adopting a healthy lifestyle and getting preventive care can go a long way to promote health and wellness. Talk with your health care provider about what schedule of regular examinations is right for you. This is a good chance for you to check in with your provider about disease prevention and staying healthy. In between checkups, there are plenty of things you can do on your own. Experts have done a lot of research about which lifestyle changes and preventive measures are most likely to keep you healthy. Ask your health care provider for more information. WEIGHT AND DIET  Eat a healthy diet  Be sure to include plenty of vegetables, fruits, low-fat dairy products, and lean protein.  Do not eat a lot of foods high in solid fats, added sugars, or salt.  Get regular exercise. This is one of the most important things you can do for your health.  Most adults should exercise for at least 150 minutes each week. The exercise should increase your heart rate and make you sweat (moderate-intensity exercise).  Most adults should also do strengthening exercises at least twice a week. This is in addition to the moderate-intensity exercise.  Maintain a healthy weight  Body mass index (BMI) is a measurement that can be used to identify possible weight problems. It estimates body fat based on height and weight. Your health care  provider can help determine your BMI and help you achieve or maintain a healthy weight.  For females 55 years of age and older:   A BMI below 18.5 is considered underweight.  A BMI of 18.5 to 24.9 is normal.  A BMI of 25 to 29.9 is considered overweight.  A BMI of 30 and above is considered obese.  Watch levels of cholesterol and blood lipids  You should start having your blood tested for lipids and cholesterol at 70 years of age, then have this test every 5 years.  You may need to have your cholesterol levels checked more often if:  Your lipid or cholesterol levels are high.  You are older than 70 years of age.  You are at high risk for heart disease.  CANCER SCREENING   Lung Cancer  Lung cancer screening is recommended for adults 24-10 years old who are at high risk for lung cancer because of a history of smoking.  A yearly low-dose CT scan of the lungs is recommended for people who:  Currently smoke.  Have quit within the past 15 years.  Have at least a 30-pack-year history of smoking. A pack year is smoking an average of one pack of cigarettes a day for 1 year.  Yearly screening should continue until it has been 15 years since you quit.  Yearly screening should stop if you develop a health problem that would prevent you from having lung cancer treatment.  Breast Cancer  Practice breast  self-awareness. This means understanding how your breasts normally appear and feel.  It also means doing regular breast self-exams. Let your health care provider know about any changes, no matter how small.  If you are in your 20s or 30s, you should have a clinical breast exam (CBE) by a health care provider every 1-3 years as part of a regular health exam.  If you are 4 or older, have a CBE every year. Also consider having a breast X-ray (mammogram) every year.  If you have a family history of breast cancer, talk to your health care provider about genetic screening.  If you  are at high risk for breast cancer, talk to your health care provider about having an MRI and a mammogram every year.  Breast cancer gene (BRCA) assessment is recommended for women who have family members with BRCA-related cancers. BRCA-related cancers include:  Breast.  Ovarian.  Tubal.  Peritoneal cancers.  Results of the assessment will determine the need for genetic counseling and BRCA1 and BRCA2 testing. Cervical Cancer Your health care provider may recommend that you be screened regularly for cancer of the pelvic organs (ovaries, uterus, and vagina). This screening involves a pelvic examination, including checking for microscopic changes to the surface of your cervix (Pap test). You may be encouraged to have this screening done every 3 years, beginning at age 89.  For women ages 48-65, health care providers may recommend pelvic exams and Pap testing every 3 years, or they may recommend the Pap and pelvic exam, combined with testing for human papilloma virus (HPV), every 5 years. Some types of HPV increase your risk of cervical cancer. Testing for HPV may also be done on women of any age with unclear Pap test results.  Other health care providers may not recommend any screening for nonpregnant women who are considered low risk for pelvic cancer and who do not have symptoms. Ask your health care provider if a screening pelvic exam is right for you.  If you have had past treatment for cervical cancer or a condition that could lead to cancer, you need Pap tests and screening for cancer for at least 20 years after your treatment. If Pap tests have been discontinued, your risk factors (such as having a new sexual partner) need to be reassessed to determine if screening should resume. Some women have medical problems that increase the chance of getting cervical cancer. In these cases, your health care provider may recommend more frequent screening and Pap tests. Colorectal Cancer  This type of  cancer can be detected and often prevented.  Routine colorectal cancer screening usually begins at 70 years of age and continues through 70 years of age.  Your health care provider may recommend screening at an earlier age if you have risk factors for colon cancer.  Your health care provider may also recommend using home test kits to check for hidden blood in the stool.  A small camera at the end of a tube can be used to examine your colon directly (sigmoidoscopy or colonoscopy). This is done to check for the earliest forms of colorectal cancer.  Routine screening usually begins at age 35.  Direct examination of the colon should be repeated every 5-10 years through 70 years of age. However, you may need to be screened more often if early forms of precancerous polyps or small growths are found. Skin Cancer  Check your skin from head to toe regularly.  Tell your health care provider about any new moles  or changes in moles, especially if there is a change in a mole's shape or color.  Also tell your health care provider if you have a mole that is larger than the size of a pencil eraser.  Always use sunscreen. Apply sunscreen liberally and repeatedly throughout the day.  Protect yourself by wearing long sleeves, pants, a wide-brimmed hat, and sunglasses whenever you are outside. HEART DISEASE, DIABETES, AND HIGH BLOOD PRESSURE   High blood pressure causes heart disease and increases the risk of stroke. High blood pressure is more likely to develop in:  People who have blood pressure in the high end of the normal range (130-139/85-89 mm Hg).  People who are overweight or obese.  People who are African American.  If you are 38-74 years of age, have your blood pressure checked every 3-5 years. If you are 74 years of age or older, have your blood pressure checked every year. You should have your blood pressure measured twice--once when you are at a hospital or clinic, and once when you are  not at a hospital or clinic. Record the average of the two measurements. To check your blood pressure when you are not at a hospital or clinic, you can use:  An automated blood pressure machine at a pharmacy.  A home blood pressure monitor.  If you are between 21 years and 76 years old, ask your health care provider if you should take aspirin to prevent strokes.  Have regular diabetes screenings. This involves taking a blood sample to check your fasting blood sugar level.  If you are at a normal weight and have a low risk for diabetes, have this test once every three years after 70 years of age.  If you are overweight and have a high risk for diabetes, consider being tested at a younger age or more often. PREVENTING INFECTION  Hepatitis B  If you have a higher risk for hepatitis B, you should be screened for this virus. You are considered at high risk for hepatitis B if:  You were born in a country where hepatitis B is common. Ask your health care provider which countries are considered high risk.  Your parents were born in a high-risk country, and you have not been immunized against hepatitis B (hepatitis B vaccine).  You have HIV or AIDS.  You use needles to inject street drugs.  You live with someone who has hepatitis B.  You have had sex with someone who has hepatitis B.  You get hemodialysis treatment.  You take certain medicines for conditions, including cancer, organ transplantation, and autoimmune conditions. Hepatitis C  Blood testing is recommended for:  Everyone born from [birth year] through 1965.  Anyone with known risk factors for hepatitis C. Sexually transmitted infections (STIs)  You should be screened for sexually transmitted infections (STIs) including gonorrhea and chlamydia if:  You are sexually active and are younger than 70 years of age.  You are older than 70 years of age and your health care provider tells you that you are at risk for this type of  infection.  Your sexual activity has changed since you were last screened and you are at an increased risk for chlamydia or gonorrhea. Ask your health care provider if you are at risk.  If you do not have HIV, but are at risk, it may be recommended that you take a prescription medicine daily to prevent HIV infection. This is called pre-exposure prophylaxis (PrEP). You are considered at risk if:  You are sexually active and do not regularly use condoms or know the HIV status of your partner(s).  You take drugs by injection.  You are sexually active with a partner who has HIV. Talk with your health care provider about whether you are at high risk of being infected with HIV. If you choose to begin PrEP, you should first be tested for HIV. You should then be tested every 3 months for as long as you are taking PrEP.  PREGNANCY   If you are premenopausal and you may become pregnant, ask your health care provider about preconception counseling.  If you may become pregnant, take 400 to 800 micrograms (mcg) of folic acid every day.  If you want to prevent pregnancy, talk to your health care provider about birth control (contraception). OSTEOPOROSIS AND MENOPAUSE   Osteoporosis is a disease in which the bones lose minerals and strength with aging. This can result in serious bone fractures. Your risk for osteoporosis can be identified using a bone density scan.  If you are 53 years of age or older, or if you are at risk for osteoporosis and fractures, ask your health care provider if you should be screened.  Ask your health care provider whether you should take a calcium or vitamin D supplement to lower your risk for osteoporosis.  Menopause may have certain physical symptoms and risks.  Hormone replacement therapy may reduce some of these symptoms and risks. Talk to your health care provider about whether hormone replacement therapy is right for you.  HOME CARE INSTRUCTIONS   Schedule regular  health, dental, and eye exams.  Stay current with your immunizations.   Do not use any tobacco products including cigarettes, chewing tobacco, or electronic cigarettes.  If you are pregnant, do not drink alcohol.  If you are breastfeeding, limit how much and how often you drink alcohol.  Limit alcohol intake to no more than 1 drink per day for nonpregnant women. One drink equals 12 ounces of beer, 5 ounces of wine, or 1 ounces of hard liquor.  Do not use street drugs.  Do not share needles.  Ask your health care provider for help if you need support or information about quitting drugs.  Tell your health care provider if you often feel depressed.  Tell your health care provider if you have ever been abused or do not feel safe at home.   This information is not intended to replace advice given to you by your health care provider. Make sure you discuss any questions you have with your health care provider.   Document Released: 03/11/2011 Document Revised: 09/16/2014 Document Reviewed: 07/28/2013 Elsevier Interactive Patient Education [employer].

## 2015-10-16 NOTE — Progress Notes (Signed)
Subjective:    Patient ID: Megan Pham, female    DOB: [birth year], 70 y.o.   MRN: [id number]  HPI 70 year-old patient who is seen today for a preventive health examination.  She does quite well she does have a history of mild arthritis and exogenous obesity.   Family history father died age 31 of lung cancer. Mother died at 47 of lung cancer. 3 sisters in good health. Grandmother and an aunt also had breast cancer  1. Risk factors, based on past  M,S,F history- no significant cardiovascular risk factors  2.  Physical activities: Remains active. She states that activities that Y. at least twice weekly and walks probably once or twice weekly  3.  Depression/mood: History depression or mood disorder  4.  Hearing: Moderate deficits especially right ear.  Uses hearing aids  5.  ADL's: Independent in all aspects of daily living  6.  Fall risk: Low  7.  Home safety: No problems identified  8.  Height weight, and visual acuity; height weight stable no change in visual acuity has a cataract extraction surgery with lens implantation  9.  Counseling: More exercise modest weight loss all encouraged  10. Lab orders based on risk factors: Laboratory update will be reviewed  11. Referral : Needs screening colonoscopy and followup mammogram  12. Care plan: As above  13. Cognitive assessment: Alert and oriented normal affect. No cognitive dysfunction   14.   Preventive services will include annual clinical examinations.  Annual mammogram encouraged.  Follow-up colonoscopy also recommended   15.  Provider list includes primary care radiology and GI    Past Medical History  Diagnosis Date  . Arthritis   . Ulcer     ulcerative colitis    Social History   Social History  . Marital Status: Married    Spouse Name: N/A  . Number of Children: N/A  . Years of Education: N/A   Occupational History  . retired    Social History Main Topics  . Smoking status: Former Smoker   Quit date: 09/09/2006  . Smokeless tobacco: Not on file  . Alcohol Use: No  . Drug Use: No  . Sexual Activity: Not on file   Other Topics Concern  . Not on file   Social History Narrative    Past Surgical History  Procedure Laterality Date  . Cholecystectomy  2000  . Appendectomy  1973  . Tonsillectomy  1970  . Abdominal hysterectomy  1973  . Hammer toe surgery  2004    Family History  Problem Relation Age of Onset  . Arthritis Mother   . Cancer Mother     lung  . Arthritis Father   . Cancer Father     lung  . Arthritis Maternal Aunt   . Cancer Maternal Aunt     breast  . Arthritis Maternal Uncle   . Arthritis Paternal Aunt   . Arthritis Paternal Uncle   . Arthritis Maternal Grandmother   . Arthritis Maternal Grandfather   . Arthritis Paternal Grandmother   . Cancer Paternal Grandmother     breast  . Arthritis Paternal Grandfather     Allergies  Allergen Reactions  . Cefoxitin Sodium In Dextrose Hives    Current Outpatient Prescriptions on File Prior to Visit  Medication Sig Dispense Refill  . acetaminophen (TYLENOL) 650 MG CR tablet Take 1,300 mg by mouth every morning.    . Multiple Vitamins-Minerals (CENTRUM SILVER ADULT 50+) TABS Take 1 tablet  by mouth daily.     No current facility-administered medications on file prior to visit.    BP 150/76 mmHg  Pulse 70  Temp(Src) 98.3 F (36.8 C) (Oral)  Resp 20  Ht 5' 0.5" (1.537 m)  Wt 180 lb (81.647 kg)  BMI 34.56 kg/m2  SpO2 98%       Review of Systems  Constitutional: Negative for fever, appetite change, fatigue and unexpected weight change.  HENT: Negative for congestion, dental problem, ear pain, hearing loss, mouth sores, nosebleeds, sinus pressure, sore throat, tinnitus, trouble swallowing and voice change.   Eyes: Negative for photophobia, pain, redness and visual disturbance.  Respiratory: Negative for cough, chest tightness and shortness of breath.   Cardiovascular: Negative for chest  pain, palpitations and leg swelling.  Gastrointestinal: Negative for nausea, vomiting, abdominal pain, diarrhea, constipation, blood in stool, abdominal distention and rectal pain.  Genitourinary: Negative for dysuria, urgency, frequency, hematuria, flank pain, vaginal bleeding, vaginal discharge, difficulty urinating, genital sores, vaginal pain, menstrual problem and pelvic pain.  Musculoskeletal: Negative for back pain, arthralgias and neck stiffness.  Skin: Negative for rash.       Painful nodule left breast  Neurological: Negative for dizziness, syncope, speech difficulty, weakness, light-headedness, numbness and headaches.  Hematological: Negative for adenopathy. Does not bruise/bleed easily.  Psychiatric/Behavioral: Negative for suicidal ideas, behavioral problems, self-injury, dysphoric mood and agitation. The patient is not nervous/anxious.        Objective:   Physical Exam  Constitutional: She is oriented to person, place, and time. She appears well-developed and well-nourished.  HENT:  Head: Normocephalic and atraumatic.  Right Ear: External ear normal.  Left Ear: External ear normal.  Mouth/Throat: Oropharynx is clear and moist.   Bilateral hearing aids  Eyes: Conjunctivae and EOM are normal.  Neck: Normal range of motion. Neck supple. No JVD present. No thyromegaly present.  Cardiovascular: Normal rate, regular rhythm, normal heart sounds and intact distal pulses.   No murmur heard.  Slight decreased right dorsalis pedis pulse  Pulmonary/Chest: Effort normal and breath sounds normal. She has no wheezes. She has no rales.  Abdominal: Soft. Bowel sounds are normal. She exhibits no distension and no mass. There is no tenderness. There is no rebound and no guarding.  Musculoskeletal: Normal range of motion. She exhibits no edema or tenderness.  Neurological: She is alert and oriented to person, place, and time. She has normal reflexes. No cranial nerve deficit. She exhibits  normal muscle tone. Coordination normal.  Skin: Skin is warm and dry. No rash noted.  Psychiatric: She has a normal mood and affect. Her behavior is normal.          Assessment & Plan:    Preventive health examination Exogenous obesity    we'll check screening lab  Screening mammogram encouraged  Screening colonoscopy

## 2015-10-16 NOTE — Progress Notes (Signed)
Pre visit review using our clinic review tool, if applicable. No additional management support is needed unless otherwise documented below in the visit note. 

## 2015-10-16 NOTE — Addendum Note (Signed)
Addended by: Gari Crown D on: 10/16/2015 10:52 AM   Modules accepted: Orders

## 2015-10-16 NOTE — Addendum Note (Signed)
Addended by: Marian Sorrow on: 10/16/2015 12:02 PM   Modules accepted: Orders

## 2015-10-17 ENCOUNTER — Encounter: Payer: Self-pay | Admitting: Gastroenterology

## 2015-11-06 ENCOUNTER — Telehealth: Payer: Self-pay | Admitting: Internal Medicine

## 2015-11-06 DIAGNOSIS — Z1231 Encounter for screening mammogram for malignant neoplasm of breast: Secondary | ICD-10-CM

## 2015-11-06 DIAGNOSIS — R69 Illness, unspecified: Secondary | ICD-10-CM | POA: Diagnosis not present

## 2015-11-06 NOTE — Telephone Encounter (Signed)
Pt states she would like a call back about her labs results. Pt sees abnormal tsh and dyslipidemia.  Please call back.

## 2015-11-06 NOTE — Telephone Encounter (Signed)
Spoke to pt, told her Thyroid was in normal range, but Cholesterol is still elevated at 209, but has dropped a lot from 260. Pt verbalized understanding. Pt also said no one has contacted her about scheduling Mammogram. Told her I will check and make sure order is in and someone will contact you. Pt verbalized understanding. Order for Mammo put in EPIC.

## 2015-11-22 ENCOUNTER — Ambulatory Visit (AMBULATORY_SURGERY_CENTER): Payer: Self-pay | Admitting: *Deleted

## 2015-11-22 VITALS — Ht 62.0 in | Wt 176.0 lb

## 2015-11-22 DIAGNOSIS — Z1211 Encounter for screening for malignant neoplasm of colon: Secondary | ICD-10-CM

## 2015-11-22 NOTE — Progress Notes (Signed)
Patient denies any allergies to eggs or soy. Patient denies any problems with anesthesia/sedation. Patient denies any oxygen use at home and does not take any diet/weight loss medications.  

## 2015-12-04 ENCOUNTER — Ambulatory Visit (AMBULATORY_SURGERY_CENTER): Payer: Medicare HMO | Admitting: Gastroenterology

## 2015-12-04 ENCOUNTER — Encounter: Payer: Self-pay | Admitting: Gastroenterology

## 2015-12-04 VITALS — BP 150/72 | HR 66 | Temp 98.2°F | Resp 19 | Ht 62.0 in | Wt 176.0 lb

## 2015-12-04 DIAGNOSIS — Z1211 Encounter for screening for malignant neoplasm of colon: Secondary | ICD-10-CM

## 2015-12-04 DIAGNOSIS — K5190 Ulcerative colitis, unspecified, without complications: Secondary | ICD-10-CM | POA: Diagnosis not present

## 2015-12-04 MED ORDER — SODIUM CHLORIDE 0.9 % IV SOLN: 500.0000 mL | INTRAVENOUS | Status: DC

## 2015-12-04 NOTE — Patient Instructions (Signed)
YOU HAD AN ENDOSCOPIC PROCEDURE TODAY AT THE [facility]:   Refer to the procedure report that was given to you for any specific questions about what was found during the examination.  If the procedure report does not answer your questions, please call your gastroenterologist to clarify.  If you requested that your care partner not be given the details of your procedure findings, then the procedure report has been included in a sealed envelope for you to review at your convenience later.  YOU SHOULD EXPECT: Some feelings of bloating in the abdomen. Passage of more gas than usual.  Walking can help get rid of the air that was put into your GI tract during the procedure and reduce the bloating. If you had a lower endoscopy (such as a colonoscopy or flexible sigmoidoscopy) you may notice spotting of blood in your stool or on the toilet paper. If you underwent a bowel prep for your procedure, you may not have a normal bowel movement for a few days.  Please Note:  You might notice some irritation and congestion in your nose or some drainage.  This is from the oxygen used during your procedure.  There is no need for concern and it should clear up in a day or so.  SYMPTOMS TO REPORT IMMEDIATELY:   Following lower endoscopy (colonoscopy or flexible sigmoidoscopy):  Excessive amounts of blood in the stool  Significant tenderness or worsening of abdominal pains  Swelling of the abdomen that is new, acute  Fever of 100F or higher   For urgent or emergent issues, a gastroenterologist can be reached at any hour by calling [phone].   DIET: Your first meal following the procedure should be a small meal and then it is ok to progress to your normal diet. Heavy or fried foods are harder to digest and may make you feel nauseous or bloated.  Likewise, meals heavy in dairy and vegetables can increase bloating.  Drink plenty of fluids but you should avoid alcoholic beverages for 24  hours.  ACTIVITY:  You should plan to take it easy for the rest of today and you should NOT DRIVE or use heavy machinery until tomorrow (because of the sedation medicines used during the test).    FOLLOW UP: Our staff will call the number listed on your records the next business day following your procedure to check on you and address any questions or concerns that you may have regarding the information given to you following your procedure. If we do not reach you, we will leave a message.  However, if you are feeling well and you are not experiencing any problems, there is no need to return our call.  We will assume that you have returned to your regular daily activities without incident.  If any biopsies were taken you will be contacted by phone or by letter within the next 1-3 weeks.  Please call us at [phone] if you have not heard about the biopsies in 3 weeks.    SIGNATURES/CONFIDENTIALITY: You and/or your care partner have signed paperwork which will be entered into your electronic medical record.  These signatures attest to the fact that that the information above on your After Visit Summary has been reviewed and is understood.  Full responsibility of the confidentiality of this discharge information lies with you and/or your care-partner.   Resume medications. 

## 2015-12-04 NOTE — Op Note (Signed)
[facility] Patient Name: Megan Pham Procedure Date: 12/04/2015 11:02 AM MRN: [id number] Endoscopist: [place]. Loletha Carrow , MD Age: 70 Referring MD:  Date of Birth: 07-27-1946 Gender: Female Procedure:                Colonoscopy Indications:              Screening for colorectal malignant neoplasm Medicines:                Monitored Anesthesia Care Procedure:                Pre-Anesthesia Assessment:                           - Prior to the procedure, a History and Physical                            was performed, and patient medications and                            allergies were reviewed. The patient's tolerance of                            previous anesthesia was also reviewed. The risks                            and benefits of the procedure and the sedation                            options and risks were discussed with the patient.                            All questions were answered, and informed consent                            was obtained. Prior Anticoagulants: The patient has                            taken no previous anticoagulant or antiplatelet                            agents. ASA Grade Assessment: II - A patient with                            mild systemic disease. After reviewing the risks                            and benefits, the patient was deemed in                            satisfactory condition to undergo the procedure.                           After obtaining informed consent, the colonoscope  was passed under direct vision. Throughout the                            procedure, the patient's blood pressure, pulse, and                            oxygen saturations were monitored continuously. The                            Model PCF-H190L [id number]) scope was introduced                            through the anus and advanced to the the cecum,                            identified by appendiceal orifice and  ileocecal                            valve. The colonoscopy was performed without                            difficulty. The patient tolerated the procedure                            well. The quality of the bowel preparation was                            fair. The ileocecal valve, appendiceal orifice, and                            rectum were photographed. The bowel preparation                            used was Miralax. The quality of the bowel                            preparation was evaluated using the BBPS [facility]                            Bowel Preparation Scale) with scores of: Right                            Colon = 1, Transverse Colon = 1 and Left Colon = 1.                            The total BBPS score equals 3. Scope In: 11:16:31 AM Scope Out: 11:32:06 AM Scope Withdrawal Time: 0 hours 9 minutes 11 seconds  Total Procedure Duration: 0 hours 15 minutes 35 seconds  Findings:      The perianal and digital rectal examinations were normal.      The entire examined colon appeared normal on direct and retroflexion       views. Complications:            No immediate complications. Estimated  Blood Loss:     Estimated blood loss: none. Impression:               - Preparation of the colon was fair.                           - The entire examined colon is normal on direct and                            retroflexion views.                           - No specimens collected. Recommendation:           - Patient has a contact number available for                            emergencies. The signs and symptoms of potential                            delayed complications were discussed with the                            patient. Return to normal activities tomorrow.                            Written discharge instructions were provided to the                            patient.                           - Resume previous diet.                           - Continue present  medications.                           - Repeat screening colonoscopy in 5 years because                            the bowel preparation was poor. Procedure Code(s):        --- Professional ---                           [phone], Colonoscopy, flexible; diagnostic, including                            collection of specimen(s) by brushing or washing,                            when performed (separate procedure) CPT copyright 2016 [employer]. All rights reserved. Henry L. Loletha Carrow, MD 12/04/2015 11:38:24 AM This report has been signed electronically. Number of Addenda: 0 Referring MD:      Marletta Lor

## 2015-12-04 NOTE — Progress Notes (Signed)
Report given to RN, vss

## 2015-12-05 ENCOUNTER — Telehealth: Payer: Self-pay

## 2015-12-05 NOTE — Telephone Encounter (Signed)
  Follow up Call-  Call back number 12/04/2015  Post procedure Call Back phone  # [phone]  Permission to leave phone message No  comments no answering machine     Patient questions:  Do you have a fever, pain , or abdominal swelling? No. Pain Score  0 *  Have you tolerated food without any problems? Yes.    Have you been able to return to your normal activities? Yes.    Do you have any questions about your discharge instructions: Diet   No. Medications  No. Follow up visit  No.  Do you have questions or concerns about your Care? No.  Actions: * If pain score is 4 or above: No action needed, pain <4.

## 2016-05-17 DIAGNOSIS — R69 Illness, unspecified: Secondary | ICD-10-CM | POA: Diagnosis not present

## 2016-05-20 ENCOUNTER — Encounter: Payer: Self-pay | Admitting: Internal Medicine

## 2016-05-20 ENCOUNTER — Ambulatory Visit (INDEPENDENT_AMBULATORY_CARE_PROVIDER_SITE_OTHER): Payer: Medicare HMO | Admitting: Internal Medicine

## 2016-05-20 VITALS — BP 140/80 | HR 75 | Temp 97.9°F | Resp 20 | Ht 62.0 in | Wt 176.0 lb

## 2016-05-20 DIAGNOSIS — R350 Frequency of micturition: Secondary | ICD-10-CM | POA: Diagnosis not present

## 2016-05-20 DIAGNOSIS — R1030 Lower abdominal pain, unspecified: Secondary | ICD-10-CM | POA: Diagnosis not present

## 2016-05-20 LAB — POC URINALSYSI DIPSTICK (AUTOMATED)
BILIRUBIN UA: NEGATIVE
GLUCOSE UA: NEGATIVE
Ketones, UA: NEGATIVE
NITRITE UA: NEGATIVE
Protein, UA: NEGATIVE
Spec Grav, UA: <=1.005
Urobilinogen, UA: 0.2
pH, UA: 5.5

## 2016-05-20 MED ORDER — SULFAMETHOXAZOLE-TRIMETHOPRIM 800-160 MG PO TABS: 1.0000 | ORAL_TABLET | Freq: Two times a day (BID) | ORAL | 0 refills | Status: DC

## 2016-05-20 NOTE — Progress Notes (Signed)
Subjective:    Patient ID: Megan Pham, female    DOB: 1946/02/15, 70 y.o.   MRN: [id number]  HPI 70 year old patient who presents with a one-week history of lower abdominal pain, urinary frequency and intermittent dysuria.  She also describes some low back pain and a general sense of unwellness.  No fever or chills. She has had urinary tract infections in the past with similar symptoms.  Past Medical History:  Diagnosis Date  . Arthritis   . Ulcer    ulcerative colitis     Social History   Social History  . Marital status: Married    Spouse name: N/A  . Number of children: N/A  . Years of education: N/A   Occupational History  . retired    Social History Main Topics  . Smoking status: Former Smoker    Quit date: 09/09/2006  . Smokeless tobacco: Never Used  . Alcohol use No  . Drug use: No  . Sexual activity: Not on file   Other Topics Concern  . Not on file   Social History Narrative  . No narrative on file    Past Surgical History:  Procedure Laterality Date  . ABDOMINAL HYSTERECTOMY  1973  . APPENDECTOMY  1973  . CHOLECYSTECTOMY  2000  . COLONOSCOPY  2004   by Dr.James Weissman-had left sided diverticulosis  . [place]  2004  . TONSILLECTOMY  1970    Family History  Problem Relation Age of Onset  . Arthritis Mother   . Cancer Mother     lung  . Arthritis Father   . Cancer Father     lung  . Arthritis Maternal Aunt   . Cancer Maternal Aunt     breast  . Arthritis Maternal Uncle   . Arthritis Paternal Aunt   . Arthritis Paternal Uncle   . Arthritis Maternal Grandmother   . Arthritis Maternal Grandfather   . Arthritis Paternal Grandmother   . Cancer Paternal Grandmother     breast  . Arthritis Paternal Grandfather   . Colon cancer Neg Hx     Allergies  Allergen Reactions  . Cefoxitin Sodium In Dextrose Hives  . Mefoxin [Cefoxitin] Hives    "Myfoxin" during surgery per pt.     Current Outpatient Prescriptions on File Prior  to Visit  Medication Sig Dispense Refill  . acetaminophen (ARTHRITIS PAIN RELIEF) 650 MG CR tablet Take 1,300 mg by mouth daily. Reported on 12/04/2015    . CALCIUM-MAGNESIUM-ZINC PO Take 1 tablet by mouth daily.    . Multiple Vitamins-Minerals (CENTRUM SILVER ADULT 50+) TABS Take 1 tablet by mouth daily.    . Multiple Vitamins-Minerals (HAIR/SKIN/NAILS PO) Take 1 tablet by mouth daily.     No current facility-administered medications on file prior to visit.     BP 140/80 (BP Location: Left Arm, Patient Position: Sitting, Cuff Size: Normal)   Pulse 75   Temp 97.9 F (36.6 C) (Oral)   Resp 20   Ht 5\' 2"  (1.575 m)   Wt 176 lb (79.8 kg)   SpO2 98%   BMI 32.19 kg/m      Review of Systems  Constitutional: Positive for fatigue.  HENT: Negative for congestion, dental problem, hearing loss, rhinorrhea, sinus pressure, sore throat and tinnitus.   Eyes: Negative for pain, discharge and visual disturbance.  Respiratory: Negative for cough and shortness of breath.   Cardiovascular: Negative for chest pain, palpitations and leg swelling.  Gastrointestinal: Positive for abdominal pain. Negative  for abdominal distention, blood in stool, constipation, diarrhea, nausea and vomiting.  Genitourinary: Positive for dysuria and frequency. Negative for difficulty urinating, flank pain, hematuria, pelvic pain, urgency, vaginal bleeding, vaginal discharge and vaginal pain.  Musculoskeletal: Positive for back pain. Negative for arthralgias, gait problem and joint swelling.  Skin: Negative for rash.  Neurological: Negative for dizziness, syncope, speech difficulty, weakness, numbness and headaches.  Hematological: Negative for adenopathy.  Psychiatric/Behavioral: Negative for agitation, behavioral problems and dysphoric mood. The patient is not nervous/anxious.        Objective:   Physical Exam  Constitutional: She is oriented to person, place, and time. She appears well-developed and well-nourished.  No distress.  Afebrile.  Blood pressure normal   HENT:  Head: Normocephalic.  Right Ear: External ear normal.  Left Ear: External ear normal.  Mouth/Throat: Oropharynx is clear and moist.  Eyes: Conjunctivae and EOM are normal. Pupils are equal, round, and reactive to light.  Neck: Normal range of motion. Neck supple. No thyromegaly present.  Cardiovascular: Normal rate, regular rhythm, normal heart sounds and intact distal pulses.   Pulmonary/Chest: Effort normal and breath sounds normal.  Abdominal: Soft. Bowel sounds are normal. She exhibits no mass. There is no tenderness.  Very mild epigastric tenderness and prominent suprapubic tenderness  Musculoskeletal: Normal range of motion.  Lymphadenopathy:    She has no cervical adenopathy.  Neurological: She is alert and oriented to person, place, and time.  Skin: Skin is warm and dry. No rash noted.  Psychiatric: She has a normal mood and affect. Her behavior is normal.          Assessment & Plan:   UTI.  Will treat with Septra DS twice a day for 7 days Annual exam next year as scheduled  [name]

## 2016-05-20 NOTE — Progress Notes (Signed)
Pre visit review using our clinic review tool, if applicable. No additional management support is needed unless otherwise documented below in the visit note. 

## 2016-05-20 NOTE — Patient Instructions (Signed)
Drink as much fluid as you  can tolerate over the next few days  Take your antibiotic as prescribed until ALL of it is gone, but stop if you develop a rash, swelling, or any side effects of the medication.  Contact our office as soon as possible if  there are side effects of the medication.   

## 2016-06-18 ENCOUNTER — Ambulatory Visit (INDEPENDENT_AMBULATORY_CARE_PROVIDER_SITE_OTHER): Payer: Medicare HMO

## 2016-06-18 DIAGNOSIS — Z23 Encounter for immunization: Secondary | ICD-10-CM

## 2016-09-30 ENCOUNTER — Ambulatory Visit (INDEPENDENT_AMBULATORY_CARE_PROVIDER_SITE_OTHER): Payer: Medicare HMO | Admitting: Internal Medicine

## 2016-09-30 ENCOUNTER — Encounter: Payer: Self-pay | Admitting: Internal Medicine

## 2016-09-30 VITALS — BP 136/76 | HR 74 | Temp 97.9°F | Ht 62.0 in | Wt 177.6 lb

## 2016-09-30 DIAGNOSIS — N390 Urinary tract infection, site not specified: Secondary | ICD-10-CM | POA: Diagnosis not present

## 2016-09-30 DIAGNOSIS — R300 Dysuria: Secondary | ICD-10-CM | POA: Diagnosis not present

## 2016-09-30 LAB — POC URINALSYSI DIPSTICK (AUTOMATED)
BILIRUBIN UA: NEGATIVE
GLUCOSE UA: NEGATIVE
KETONES UA: NEGATIVE
Nitrite, UA: NEGATIVE
Protein, UA: NEGATIVE
SPEC GRAV UA: 1.01
Urobilinogen, UA: 0.2
pH, UA: 6

## 2016-09-30 MED ORDER — SULFAMETHOXAZOLE-TRIMETHOPRIM 800-160 MG PO TABS: 1.0000 | ORAL_TABLET | Freq: Two times a day (BID) | ORAL | 0 refills | Status: DC

## 2016-09-30 NOTE — Progress Notes (Signed)
Pre visit review using our clinic review tool, if applicable. No additional management support is needed unless otherwise documented below in the visit note. 

## 2016-09-30 NOTE — Patient Instructions (Signed)
Drink as much fluid as you  can tolerate over the next few days  Take your antibiotic as prescribed until ALL of it is gone, but stop if you develop a rash, swelling, or any side effects of the medication.  Contact our office as soon as possible if  there are side effects of the medication.   

## 2016-09-30 NOTE — Progress Notes (Signed)
Subjective:    Patient ID: Megan Pham, female    DOB: 06-24-1946, 71 y.o.   MRN: [id number]  HPI  71 year old patient who is seen today with a chief complaint of lower back and suprapubic discomfort for a proximally, 5 days.  She has been treated for UTIs in the past and she states these symptoms are quite similar.  Denies any dysuria.fever, chills or flank pain.  Past Medical History:  Diagnosis Date  . Arthritis   . Ulcer ([place])    ulcerative colitis     Social History   Social History  . Marital status: Married    Spouse name: N/A  . Number of children: N/A  . Years of education: N/A   Occupational History  . retired    Social History Main Topics  . Smoking status: Former Smoker    Quit date: 09/09/2006  . Smokeless tobacco: Never Used  . Alcohol use No  . Drug use: No  . Sexual activity: Not on file   Other Topics Concern  . Not on file   Social History Narrative  . No narrative on file    Past Surgical History:  Procedure Laterality Date  . ABDOMINAL HYSTERECTOMY  1973  . APPENDECTOMY  1973  . CHOLECYSTECTOMY  2000  . COLONOSCOPY  2004   by Dr.James Weissman-had left sided diverticulosis  . [place]  2004  . TONSILLECTOMY  1970    Family History  Problem Relation Age of Onset  . Arthritis Mother   . Cancer Mother     lung  . Arthritis Father   . Cancer Father     lung  . Arthritis Maternal Aunt   . Cancer Maternal Aunt     breast  . Arthritis Maternal Uncle   . Arthritis Paternal Aunt   . Arthritis Paternal Uncle   . Arthritis Maternal Grandmother   . Arthritis Maternal Grandfather   . Arthritis Paternal Grandmother   . Cancer Paternal Grandmother     breast  . Arthritis Paternal Grandfather   . Colon cancer Neg Hx     Allergies  Allergen Reactions  . Cefoxitin Sodium In Dextrose Hives  . Mefoxin [Cefoxitin] Hives    "Myfoxin" during surgery per pt.     Current Outpatient Prescriptions on File Prior to Visit    Medication Sig Dispense Refill  . acetaminophen (ARTHRITIS PAIN RELIEF) 650 MG CR tablet Take 1,300 mg by mouth daily. Reported on 12/04/2015    . CALCIUM-MAGNESIUM-ZINC PO Take 1 tablet by mouth daily.    . Multiple Vitamins-Minerals (CENTRUM SILVER ADULT 50+) TABS Take 1 tablet by mouth daily.    . Multiple Vitamins-Minerals (HAIR/SKIN/NAILS PO) Take 1 tablet by mouth daily.     No current facility-administered medications on file prior to visit.     BP 136/76 (BP Location: Right Arm, Patient Position: Sitting, Cuff Size: Normal)   Pulse 74   Temp 97.9 F (36.6 C) (Oral)   Ht 5\' 2"  (1.575 m)   Wt 177 lb 9.6 oz (80.6 kg)   SpO2 98%   BMI 32.48 kg/m     Review of Systems  Constitutional: Negative.   HENT: Negative for congestion, dental problem, hearing loss, rhinorrhea, sinus pressure, sore throat and tinnitus.   Eyes: Negative for pain, discharge and visual disturbance.  Respiratory: Negative for cough and shortness of breath.   Cardiovascular: Negative for chest pain, palpitations and leg swelling.  Gastrointestinal: Negative for abdominal distention, abdominal pain, blood  in stool, constipation, diarrhea, nausea and vomiting.  Genitourinary: Positive for pelvic pain. Negative for difficulty urinating, dysuria, flank pain, frequency, hematuria, urgency, vaginal bleeding, vaginal discharge and vaginal pain.  Musculoskeletal: Positive for back pain. Negative for arthralgias, gait problem and joint swelling.  Skin: Negative for rash.  Neurological: Negative for dizziness, syncope, speech difficulty, weakness, numbness and headaches.  Hematological: Negative for adenopathy.  Psychiatric/Behavioral: Negative for agitation, behavioral problems and dysphoric mood. The patient is not nervous/anxious.        Objective:   Physical Exam  Constitutional: She is oriented to person, place, and time. She appears well-developed and well-nourished.  HENT:  Head: Normocephalic.  Right  Ear: External ear normal.  Left Ear: External ear normal.  Mouth/Throat: Oropharynx is clear and moist.  Eyes: Conjunctivae and EOM are normal. Pupils are equal, round, and reactive to light.  Neck: Normal range of motion. Neck supple. No thyromegaly present.  Cardiovascular: Normal rate, regular rhythm, normal heart sounds and intact distal pulses.   Pulmonary/Chest: Effort normal and breath sounds normal.  Abdominal: Soft. Bowel sounds are normal. She exhibits no mass. There is tenderness.  Very mild suprapubic tenderness  Musculoskeletal: Normal range of motion.  Lymphadenopathy:    She has no cervical adenopathy.  Neurological: She is alert and oriented to person, place, and time.  Skin: Skin is warm and dry. No rash noted.  Psychiatric: She has a normal mood and affect. Her behavior is normal.          Assessment & Plan:   Probable early UTI.  Urinalysis revealed mild pyuria.  Patient has had similar symptomatology in the past.  It has responded to antibiotic therapy.  Will retreat with Septra DS for 7 days.  Will force fluids  [name]

## 2016-10-01 ENCOUNTER — Telehealth: Payer: Self-pay | Admitting: Internal Medicine

## 2016-10-01 NOTE — Telephone Encounter (Signed)
Patient requesting to change pcp from Dr. Raliegh Ip to Elyn Aquas.  Pt lives in [place] and prefers to be closer to home.

## 2016-10-01 NOTE — Telephone Encounter (Signed)
Okay to transfer  

## 2016-10-01 NOTE — Telephone Encounter (Signed)
Ok to transfer. 

## 2016-10-02 DIAGNOSIS — R69 Illness, unspecified: Secondary | ICD-10-CM | POA: Diagnosis not present

## 2016-10-02 NOTE — Telephone Encounter (Signed)
Patient called and scheduled for establishment visit 11/04/16.

## 2016-11-04 ENCOUNTER — Ambulatory Visit (INDEPENDENT_AMBULATORY_CARE_PROVIDER_SITE_OTHER): Payer: Medicare HMO | Admitting: Physician Assistant

## 2016-11-04 ENCOUNTER — Encounter: Payer: Self-pay | Admitting: Physician Assistant

## 2016-11-04 VITALS — BP 140/60 | HR 73 | Temp 98.2°F | Resp 14 | Ht 62.0 in | Wt 179.0 lb

## 2016-11-04 DIAGNOSIS — R300 Dysuria: Secondary | ICD-10-CM | POA: Diagnosis not present

## 2016-11-04 DIAGNOSIS — Z1239 Encounter for other screening for malignant neoplasm of breast: Secondary | ICD-10-CM

## 2016-11-04 DIAGNOSIS — M8589 Other specified disorders of bone density and structure, multiple sites: Secondary | ICD-10-CM

## 2016-11-04 DIAGNOSIS — Z1231 Encounter for screening mammogram for malignant neoplasm of breast: Secondary | ICD-10-CM

## 2016-11-04 LAB — POCT URINALYSIS DIPSTICK
Bilirubin, UA: NEGATIVE
GLUCOSE UA: NEGATIVE
Ketones, UA: NEGATIVE
NITRITE UA: NEGATIVE
Protein, UA: NEGATIVE
SPEC GRAV UA: 1.01
UROBILINOGEN UA: 0.2
pH, UA: 5

## 2016-11-04 MED ORDER — NITROFURANTOIN MONOHYD MACRO 100 MG PO CAPS: 100.0000 mg | ORAL_CAPSULE | Freq: Two times a day (BID) | ORAL | 0 refills | Status: DC

## 2016-11-04 NOTE — Progress Notes (Signed)
Patient presents to clinic today as a transfer of care. Patient with acute concerns today. Patient c/o dysuria, urinary frequency, suprapubic pressure and bloating sensation. Last treated for cystitis in January of this year with Septra. Denies complete resolution of symptoms with medication. Denies fever, chills, nausea/vomiting or flank pain.   Past Medical History:  Diagnosis Date  . Allergy    Seasonal  . Arthritis   . Ulcer ([place])    ulcerative colitis    Current Outpatient Prescriptions on File Prior to Visit  Medication Sig Dispense Refill  . acetaminophen (ARTHRITIS PAIN RELIEF) 650 MG CR tablet Take 650 mg by mouth daily. Reported on 12/04/2015    . CALCIUM-MAGNESIUM-ZINC PO Take 1 tablet by mouth daily.    . Multiple Vitamins-Minerals (CENTRUM SILVER ADULT 50+) TABS Take 1 tablet by mouth daily.    . Multiple Vitamins-Minerals (HAIR/SKIN/NAILS PO) Take 1 tablet by mouth daily.     No current facility-administered medications on file prior to visit.     Allergies  Allergen Reactions  . Cefoxitin Sodium In Dextrose Hives  . Mefoxin [Cefoxitin] Hives    "Myfoxin" during surgery per pt.     Family History  Problem Relation Age of Onset  . Arthritis Mother   . Cancer Mother     lung  . Arthritis Father   . Cancer Father     lung  . Arthritis Maternal Aunt   . Cancer Maternal Aunt     breast  . Arthritis Maternal Uncle   . Arthritis Paternal Aunt   . Arthritis Paternal Uncle   . Arthritis Maternal Grandmother   . Arthritis Maternal Grandfather   . Arthritis Paternal Grandmother   . Cancer Paternal Grandmother     breast  . Arthritis Paternal Grandfather   . Colon cancer Neg Hx     Social History   Social History  . Marital status: Married    Spouse name: N/A  . Number of children: N/A  . Years of education: N/A   Occupational History  . retired    Social History Main Topics  . Smoking status: Former Smoker    Quit date: 09/09/2006  . Smokeless  tobacco: Never Used  . Alcohol use No  . Drug use: No  . Sexual activity: Not Asked   Other Topics Concern  . None   Social History Narrative  . None    Review of Systems - See HPI.  All other ROS are negative.  There were no vitals taken for this visit.  Physical Exam  Constitutional: She is oriented to person, place, and time and well-developed, well-nourished, and in no distress.  HENT:  Head: Normocephalic and atraumatic.  Eyes: Conjunctivae are normal.  Neck: Neck supple.  Cardiovascular: Normal rate, regular rhythm, normal heart sounds and intact distal pulses.   Pulmonary/Chest: Effort normal and breath sounds normal. No respiratory distress. She has no wheezes. She has no rales. She exhibits no tenderness.  Abdominal: Soft. Bowel sounds are normal. She exhibits no distension. There is no tenderness.  Negative CVA tenderness.  Neurological: She is alert and oriented to person, place, and time.  Skin: Skin is warm and dry. No rash noted.  Psychiatric: Affect normal.  Vitals reviewed.   Recent Results (from the past 2160 hour(s))  POCT Urinalysis Dipstick (Automated)     Status: Abnormal   Collection Time: 09/30/16 12:08 PM  Result Value Ref Range   Color, UA yellow    Clarity, UA clear  Glucose, UA N    Bilirubin, UA N    Ketones, UA N    Spec Grav, UA 1.010    Blood, UA trace-lysed    pH, UA 6.0    Protein, UA N    Urobilinogen, UA 0.2    Nitrite, UA N    Leukocytes, UA Trace (A) Negative    Assessment/Plan: 1. Dysuria UA with LE. Will be sent for culture. Classic symptoms and + history of UTI. Rx Macrobid due to allergies. Will alter based on culture results. Supportive measures reviewed. Strict return precautions given to patient.  - POCT Urinalysis Dipstick   Leeanne Rio, PA-C

## 2016-11-04 NOTE — Patient Instructions (Addendum)
Your symptoms are consistent with a bladder infection, also called acute cystitis. Please take your antibiotic (Macrobid) as directed until all pills are gone.  Stay very well hydrated.  Consider a daily probiotic (Align, Culturelle, or Activia) to help prevent stomach upset caused by the antibiotic.  Taking a probiotic daily may also help prevent recurrent UTIs.  Also consider taking AZO (Phenazopyridine) tablets to help decrease pain with urination.  I will call you with your urine testing results.  We will change antibiotics if indicated.  Call or return to clinic if symptoms are not resolved by completion of antibiotic.   Schedule an appointment for a complete physical ASAP. You will be contacted for a Mammogram and Bone Density scan.   Urinary Tract Infection A urinary tract infection (UTI) can occur any place along the urinary tract. The tract includes the kidneys, ureters, bladder, and urethra. A type of germ called bacteria often causes a UTI. UTIs are often helped with antibiotic medicine.  HOME CARE   If given, take antibiotics as told by your doctor. Finish them even if you start to feel better.  Drink enough fluids to keep your pee (urine) clear or pale yellow.  Avoid tea, drinks with caffeine, and bubbly (carbonated) drinks.  Pee often. Avoid holding your pee in for a long time.  Pee before and after having sex (intercourse).  Wipe from front to back after you poop (bowel movement) if you are a woman. Use each tissue only once. GET HELP RIGHT AWAY IF:   You have back pain.  You have lower belly (abdominal) pain.  You have chills.  You feel sick to your stomach (nauseous).  You throw up (vomit).  Your burning or discomfort with peeing does not go away.  You have a fever.  Your symptoms are not better in 3 days. MAKE SURE YOU:   Understand these instructions.  Will watch your condition.  Will get help right away if you are not doing well or get worse. Document  Released: 02/12/2008 Document Revised: 05/20/2012 Document Reviewed: 03/26/2012 [facility] Patient Information 2015 [place], [place]. This information is not intended to replace advice given to you by your health care provider. Make sure you discuss any questions you have with your health care provider.

## 2016-11-04 NOTE — Progress Notes (Signed)
Pre visit review using our clinic review tool, if applicable. No additional management support is needed unless otherwise documented below in the visit note. 

## 2016-11-08 ENCOUNTER — Telehealth: Payer: Self-pay | Admitting: Physician Assistant

## 2016-11-08 NOTE — Telephone Encounter (Signed)
Patient states she had 2 missed calls from our office today.  Per review of chart I do no show any documentation of anyone attempting to contact patient.  Patient states she never heard anything back from her lab results from her last ov with [facility].  Please return call to patient with lab results.

## 2016-11-08 NOTE — Telephone Encounter (Signed)
Spoke with patient and advised her urine specimen did not get cultured to be sent out. She states her symptoms have improved. Denies any side effects or problems with the abx. She has another day left and will be finished. She has pending appointment on 11/13/16 for an appointment. If her symptoms return will repeat urine and send off for culture. She is agreeable. I did apologize several times for the missed test.

## 2016-11-12 NOTE — Progress Notes (Signed)
Pre visit review using our clinic review tool, if applicable. No additional management support is needed unless otherwise documented below in the visit note. 

## 2016-11-12 NOTE — Progress Notes (Signed)
Subjective:   Megan Pham is a 71 y.o. female who presents for Medicare Annual (Subsequent) preventive examination.  Review of Systems:  No ROS.  Medicare Wellness Visit.    Sleep patterns: sleeps about 8 hours, uninterrupted.  Home Safety/Smoke Alarms:  Smoke detectors and security in place.  Living environment; residence and Firearm Safety: Lives alone in one story home. No firearms.  Seat Belt Safety/Bike Helmet: Wears seat belt.   Counseling:   Eye Exam-Last exam 06/2016. Dental-Last exam 07/2016, every 6 months Dr Alinda Money  Female:   Pap-N/A      Mammo-11/04/2011, negative. Order placed on 11/10/2016      Dexa scan-11/01/2010. Order placed on 11/10/2016.   CCS-Colonoscopy 12/04/2015, normal. Recall 5 years.        Objective:     Vitals: BP (!) 140/58 (BP Location: Left Arm, Patient Position: Sitting, Cuff Size: Normal)   Pulse 70   Temp 97.5 F (36.4 C)   Resp 18   Ht 5\' 2"  (1.575 m)   Wt 175 lb 12.8 oz (79.7 kg)   SpO2 97%   BMI 32.15 kg/m   Body mass index is 32.15 kg/m.   Tobacco History  Smoking Status  . Former Smoker  . Quit date: 09/09/2006  Smokeless Tobacco  . Never Used     Counseling given: No   Past Medical History:  Diagnosis Date  . Allergy    Seasonal  . Arthritis   . Ulcer ([place]) 1979   ulcerative colitis   Past Surgical History:  Procedure Laterality Date  . ABDOMINAL HYSTERECTOMY  1973  . APPENDECTOMY  1973  . CHOLECYSTECTOMY  2000  . COLONOSCOPY  2004   by Dr.James Weissman-had left sided diverticulosis  . [place]  2004  . TONSILLECTOMY  1970   Family History  Problem Relation Age of Onset  . Arthritis Mother   . Cancer Mother     lung  . Arthritis Father   . Cancer Father     lung  . Arthritis Maternal Aunt   . Cancer Maternal Aunt     breast  . Arthritis Maternal Uncle   . Arthritis Paternal Aunt   . Arthritis Paternal Uncle   . Arthritis Maternal Grandmother   . Arthritis Maternal Grandfather    . Arthritis Paternal Grandmother   . Cancer Paternal Grandmother     breast  . Arthritis Paternal Grandfather   . Arthritis Son   . Healthy Grandchild     x 2  . Colon cancer Neg Hx    History  Sexual Activity  . Sexual activity: No    Outpatient Encounter Prescriptions as of 11/13/2016  Medication Sig  . acetaminophen (ARTHRITIS PAIN RELIEF) 650 MG CR tablet Take 650 mg by mouth daily. Reported on 12/04/2015  . CALCIUM-MAGNESIUM-ZINC PO Take 1 tablet by mouth daily.  . Multiple Vitamins-Minerals (CENTRUM SILVER ADULT 50+) TABS Take 1 tablet by mouth daily.  . Multiple Vitamins-Minerals (HAIR/SKIN/NAILS PO) Take 1 tablet by mouth daily.  . [DISCONTINUED] nitrofurantoin, macrocrystal-monohydrate, (MACROBID) 100 MG capsule Take 1 capsule (100 mg total) by mouth 2 (two) times daily.   No facility-administered encounter medications on file as of 11/13/2016.     Activities of Daily Living In your present state of health, do you have any difficulty performing the following activities: 11/13/2016 11/04/2016  Hearing? N N  Vision? N N  Difficulty concentrating or making decisions? N N  Walking or climbing stairs? N N  Dressing or  bathing? N N  Doing errands, shopping? N N  Preparing Food and eating ? N -  Using the Toilet? N -  In the past six months, have you accidently leaked urine? N -  Do you have problems with loss of bowel control? N -  Managing your Medications? N -  Managing your Finances? N -  Some recent data might be hidden    Patient Care Team: Brunetta Jeans, PA-C as PCP - General (Family Medicine)    Assessment:    Physical assessment deferred to PCP.  Exercise Activities and Dietary recommendations Current Exercise Habits: Structured exercise class (maintains housework and yard work), Type of exercise: strength training/weights;walking;exercise ball;calisthenics;stretching, Time (Minutes): > 60, Frequency (Times/Week): 3, Weekly Exercise (Minutes/Week): 0,  Exercise limited by: None identified   Diet (meal preparation, eat out, water intake, caffeinated beverages, dairy products, fruits and vegetables): Drinks water, tea with artificial sweetener.   Breakfast: Boiled eggs, toast, oatmeal, juice, coffee Lunch: skips lunch Dinner: baked/boiled/broiled meat with salad. Fish and chicken.    Encouraged to continue healthy food choices and exercising. Discussed not skipping meals, trying 3-4 small meals a day.   Goals    . Weight (lb) < 135 lb (61.2 kg)          Would like to lose 40 pounds by continuing to be active and eating healthy.       Fall Risk Fall Risk  11/13/2016 11/04/2016 10/16/2015 10/04/2015 10/14/2013  Falls in the past year? Yes No No No No  Number falls in past yr: 1 - - - -  Injury with Fall? No - - - -   Depression Screen PHQ 2/9 Scores 11/13/2016 11/04/2016 11/04/2016 10/16/2015  PHQ - 2 Score 0 0 0 0  PHQ- 9 Score - - 0 -     Cognitive Function       Ad8 score reviewed for issues:  Issues making decisions: no  Less interest in hobbies / activities: no  Repeats questions, stories (family complaining): no  Trouble using ordinary gadgets (microwave, computer, phone): no  Forgets the month or year: no  Mismanaging finances: no  Remembering appts:no  Daily problems with thinking and/or memory: no Ad8 score is=0     Immunization History  Administered Date(s) Administered  . Influenza Split 06/14/2011  . Influenza, High Dose Seasonal PF 06/18/2016  . Influenza,inj,Quad PF,36+ Mos 05/23/2014, 05/30/2015  . Pneumococcal Conjugate-13 10/16/2015  . Pneumococcal Polysaccharide-23 10/11/2010   Screening Tests Health Maintenance  Topic Date Due  . Hepatitis C Screening  [birth year]  . DTaP/Tdap/Td (1 - Tdap) 11/02/1964  . TETANUS/TDAP  11/02/1964  . MAMMOGRAM  10/19/2014  . PNA vac Low Risk Adult (2 of 2 - PPSV23) 10/15/2016  . COLONOSCOPY  12/03/2020  . INFLUENZA VACCINE  Completed  . DEXA SCAN  Completed    Dexa and Mammogram has been ordered, waiting for call to schedule.     Plan:      Continue doing brain stimulating activities (puzzles, reading, adult coloring books, staying active) to keep memory sharp.   Continue to eat heart healthy diet (full of fruits, vegetables, whole grains, lean protein, water--limit salt, fat, and sugar intake) and increase physical activity as tolerated.  During the course of the visit the patient was educated and counseled about the following appropriate screening and preventive services:   Vaccines to include Pneumoccal, Influenza, Hepatitis B, Td, Zostavax, HCV  Cardiovascular Disease  Colorectal cancer screening  Bone density screening  Diabetes screening  Glaucoma screening  Mammography/PAP  Nutrition counseling   Patient Instructions (the written plan) was given to the patient.   Gerilyn Nestle, RN  11/13/2016

## 2016-11-13 ENCOUNTER — Encounter: Payer: Self-pay | Admitting: Physician Assistant

## 2016-11-13 ENCOUNTER — Ambulatory Visit (INDEPENDENT_AMBULATORY_CARE_PROVIDER_SITE_OTHER): Payer: Medicare HMO | Admitting: Physician Assistant

## 2016-11-13 VITALS — BP 140/58 | HR 70 | Temp 97.5°F | Resp 18 | Ht 62.0 in | Wt 175.8 lb

## 2016-11-13 DIAGNOSIS — Z0000 Encounter for general adult medical examination without abnormal findings: Secondary | ICD-10-CM | POA: Diagnosis not present

## 2016-11-13 DIAGNOSIS — N390 Urinary tract infection, site not specified: Secondary | ICD-10-CM | POA: Diagnosis not present

## 2016-11-13 DIAGNOSIS — Z23 Encounter for immunization: Secondary | ICD-10-CM

## 2016-11-13 DIAGNOSIS — R319 Hematuria, unspecified: Secondary | ICD-10-CM

## 2016-11-13 LAB — POCT URINALYSIS DIPSTICK
Bilirubin, UA: NEGATIVE
Glucose, UA: NEGATIVE
KETONES UA: NEGATIVE
Nitrite, UA: NEGATIVE
PROTEIN UA: NEGATIVE
SPEC GRAV UA: 1.015
UROBILINOGEN UA: 0.2
pH, UA: 5

## 2016-11-13 LAB — COMPREHENSIVE METABOLIC PANEL
ALT: 25 U/L (ref 0–35)
AST: 24 U/L (ref 0–37)
Albumin: 4.5 g/dL (ref 3.5–5.2)
Alkaline Phosphatase: 93 U/L (ref 39–117)
BILIRUBIN TOTAL: 0.4 mg/dL (ref 0.2–1.2)
BUN: 14 mg/dL (ref 6–23)
CHLORIDE: 103 meq/L (ref 96–112)
CO2: 29 meq/L (ref 19–32)
Calcium: 10.2 mg/dL (ref 8.4–10.5)
Creatinine, Ser: 0.61 mg/dL (ref 0.40–1.20)
GFR: 102.76 mL/min (ref >60.00)
GLUCOSE: 116 mg/dL — AB (ref 70–99)
POTASSIUM: 4.4 meq/L (ref 3.5–5.1)
Sodium: 140 mEq/L (ref 135–145)
Total Protein: 7.6 g/dL (ref 6.0–8.3)

## 2016-11-13 LAB — CBC
HEMATOCRIT: 41.1 % (ref 36.0–46.0)
Hemoglobin: 13.5 g/dL (ref 12.0–15.0)
MCHC: 32.9 g/dL (ref 30.0–36.0)
MCV: 92.5 fl (ref 78.0–100.0)
Platelets: 376 10*3/uL (ref 150.0–400.0)
RBC: 4.44 Mil/uL (ref 3.87–5.11)
RDW: 12.9 % (ref 11.5–15.5)
WBC: 8.5 10*3/uL (ref 4.0–10.5)

## 2016-11-13 LAB — LIPID PANEL
CHOL/HDL RATIO: 4
Cholesterol: 223 mg/dL — ABNORMAL HIGH (ref 0–200)
HDL: 54.2 mg/dL (ref >39.00)
LDL CALC: 130 mg/dL — AB (ref 0–99)
NONHDL: 168.71
TRIGLYCERIDES: 194 mg/dL — AB (ref 0.0–149.0)
VLDL: 38.8 mg/dL (ref 0.0–40.0)

## 2016-11-13 LAB — TSH: TSH: 1.19 u[IU]/mL (ref 0.35–4.50)

## 2016-11-13 NOTE — Progress Notes (Signed)
Patient presents to clinic today for annual exam.  Patient is fasting for labs. Patient with [facility] today with RN.  Acute Concerns: Patient recently treated for UTI with Macrobid. Culture was never received by the lab. Patient endorses resolution of symptoms except for occasional urge. Would like repeat assessment of urine.   Health Maintenance: Immunizations -- Due for tetanus and pneumonia vaccination. Agrees to both today.  Past Medical History:  Diagnosis Date  . Allergy    Seasonal  . Arthritis   . Ulcer ([place]) 1979   ulcerative colitis    Past Surgical History:  Procedure Laterality Date  . ABDOMINAL HYSTERECTOMY  1973  . APPENDECTOMY  1973  . CHOLECYSTECTOMY  2000  . COLONOSCOPY  2004   by Dr.James Weissman-had left sided diverticulosis  . [place]  2004  . TONSILLECTOMY  1970    Current Outpatient Prescriptions on File Prior to Visit  Medication Sig Dispense Refill  . acetaminophen (ARTHRITIS PAIN RELIEF) 650 MG CR tablet Take 650 mg by mouth daily. Reported on 12/04/2015    . CALCIUM-MAGNESIUM-ZINC PO Take 1 tablet by mouth daily.    . Multiple Vitamins-Minerals (CENTRUM SILVER ADULT 50+) TABS Take 1 tablet by mouth daily.    . Multiple Vitamins-Minerals (HAIR/SKIN/NAILS PO) Take 1 tablet by mouth daily.     No current facility-administered medications on file prior to visit.     Allergies  Allergen Reactions  . Cefoxitin Sodium In Dextrose Hives  . Mefoxin [Cefoxitin] Hives    "Myfoxin" during surgery per pt.     Family History  Problem Relation Age of Onset  . Arthritis Mother   . Cancer Mother     lung  . Arthritis Father   . Cancer Father     lung  . Arthritis Maternal Aunt   . Cancer Maternal Aunt     breast  . Arthritis Maternal Uncle   . Arthritis Paternal Aunt   . Arthritis Paternal Uncle   . Arthritis Maternal Grandmother   . Arthritis Maternal Grandfather   . Arthritis Paternal Grandmother   . Cancer Paternal Grandmother    breast  . Arthritis Paternal Grandfather   . Arthritis Son   . Healthy Grandchild     x 2  . Colon cancer Neg Hx     Social History   Social History  . Marital status: Married    Spouse name: N/A  . Number of children: N/A  . Years of education: N/A   Occupational History  . retired    Social History Main Topics  . Smoking status: Former Smoker    Quit date: 09/09/2006  . Smokeless tobacco: Never Used  . Alcohol use No  . Drug use: No  . Sexual activity: No   Other Topics Concern  . Not on file   Social History Narrative  . No narrative on file   Review of Systems  Constitutional: Negative for fever and weight loss.  HENT: Negative for ear discharge, ear pain, hearing loss and tinnitus.   Eyes: Negative for blurred vision, double vision, photophobia and pain.  Respiratory: Negative for cough and shortness of breath.   Cardiovascular: Negative for chest pain and palpitations.  Gastrointestinal: Negative for abdominal pain, blood in stool, constipation, diarrhea, heartburn, melena, nausea and vomiting.  Genitourinary: Positive for urgency. Negative for dysuria, flank pain, frequency and hematuria.  Musculoskeletal: Negative for falls.  Neurological: Negative for dizziness, loss of consciousness and headaches.  Endo/Heme/Allergies: Negative for environmental  allergies.  Psychiatric/Behavioral: Negative for depression, hallucinations, substance abuse and suicidal ideas. The patient is not nervous/anxious and does not have insomnia.     BP (!) 140/58 (BP Location: Left Arm, Patient Position: Sitting, Cuff Size: Normal)   Pulse 70   Temp 97.5 F (36.4 C)   Resp 18   Ht 5\' 2"  (1.575 m)   Wt 175 lb 12.8 oz (79.7 kg)   SpO2 97%   BMI 32.15 kg/m   Physical Exam  Constitutional: She is oriented to person, place, and time and well-developed, well-nourished, and in no distress.  HENT:  Head: Normocephalic and atraumatic.  Right Ear: Tympanic membrane, external ear and  ear canal normal.  Left Ear: Tympanic membrane, external ear and ear canal normal.  Nose: Nose normal. No mucosal edema.  Mouth/Throat: Uvula is midline, oropharynx is clear and moist and mucous membranes are normal. No oropharyngeal exudate or posterior oropharyngeal erythema.  Eyes: Conjunctivae are normal. Pupils are equal, round, and reactive to light.  Neck: Neck supple. No thyromegaly present.  Cardiovascular: Normal rate, regular rhythm, normal heart sounds and intact distal pulses.   Pulmonary/Chest: Effort normal and breath sounds normal. No respiratory distress. She has no wheezes. She has no rales.  Abdominal: Soft. Bowel sounds are normal. She exhibits no distension and no mass. There is no tenderness. There is no rebound and no guarding.  Lymphadenopathy:    She has no cervical adenopathy.  Neurological: She is alert and oriented to person, place, and time. No cranial nerve deficit.  Skin: Skin is warm and dry. No rash noted.  Psychiatric: Affect normal.  Vitals reviewed.  Recent Results (from the past 2160 hour(s))  POCT Urinalysis Dipstick (Automated)     Status: Abnormal   Collection Time: 09/30/16 12:08 PM  Result Value Ref Range   Color, UA yellow    Clarity, UA clear    Glucose, UA N    Bilirubin, UA N    Ketones, UA N    Spec Grav, UA 1.010    Blood, UA trace-lysed    pH, UA 6.0    Protein, UA N    Urobilinogen, UA 0.2    Nitrite, UA N    Leukocytes, UA Trace (A) Negative  POCT Urinalysis Dipstick     Status: Abnormal   Collection Time: 11/04/16 11:06 AM  Result Value Ref Range   Color, UA yellow    Clarity, UA cloudy    Glucose, UA negative    Bilirubin, UA negative    Ketones, UA negative    Spec Grav, UA 1.010    Blood, UA 5-10    pH, UA 5.0    Protein, UA negative    Urobilinogen, UA 0.2    Nitrite, UA negative    Leukocytes, UA Trace (A) Negative   Assessment/Plan: 1. Encounter for Medicare annual wellness exam See RN notes. RN notes  reviewed.   2. Visit for preventive health examination Depression screen negative. Health Maintenance reviewed -- Immunizations updated. DEXA and mammogram ordered. Patient awaiting appointment. Preventive schedule discussed and handout given in AVS. Will obtain fasting labs today.  - Comprehensive metabolic panel - Lipid panel - Urine Culture - TSH - CBC  3. Urinary tract infection with hematuria, site unspecified Repeat urine studies today. Discussed further treatment. We have decided to defer until results are in. - POCT urinalysis dipstick - Urine culture  4. Need for Tdap vaccination Updated today. - Tdap vaccine greater than or equal to 7yo IM  5. Need for 23-polyvalent pneumococcal polysaccharide vaccine Updated today. - Pneumococcal polysaccharide vaccine 23-valent greater than or equal to 2yo subcutaneous/IM   Leeanne Rio, PA-C

## 2016-11-13 NOTE — Progress Notes (Signed)
Pre visit review using our clinic review tool, if applicable. No additional management support is needed unless otherwise documented below in the visit note. 

## 2016-11-13 NOTE — Patient Instructions (Addendum)
Please go to the lab for blood work.   Our office will call you with your results unless you have chosen to receive results via MyChart.  If your blood work is normal we will follow-up each year for physicals and as scheduled for chronic medical problems.  If anything is abnormal we will treat accordingly and get you in for a follow-up.  You will be contacted by Gynecology for an appointment.    Continue doing brain stimulating activities (puzzles, reading, adult coloring books, staying active) to keep memory sharp.   Continue to eat heart healthy diet (full of fruits, vegetables, whole grains, lean protein, water--limit salt, fat, and sugar intake) and increase physical activity as tolerated.   Fall Prevention in the Home Falls can cause injuries. They can happen to people of all ages. There are many things you can do to make your home safe and to help prevent falls. What can I do on the outside of my home?  Regularly fix the edges of walkways and driveways and fix any cracks.  Remove anything that might make you trip as you walk through a door, such as a raised step or threshold.  Trim any bushes or trees on the path to your home.  Use bright outdoor lighting.  Clear any walking paths of anything that might make someone trip, such as rocks or tools.  Regularly check to see if handrails are loose or broken. Make sure that both sides of any steps have handrails.  Any raised decks and porches should have guardrails on the edges.  Have any leaves, snow, or ice cleared regularly.  Use sand or salt on walking paths during winter.  Clean up any spills in your garage right away. This includes oil or grease spills. What can I do in the bathroom?  Use night lights.  Install grab bars by the toilet and in the tub and shower. Do not use towel bars as grab bars.  Use non-skid mats or decals in the tub or shower.  If you need to sit down in the shower, use a plastic, non-slip  stool.  Keep the floor dry. Clean up any water that spills on the floor as soon as it happens.  Remove soap buildup in the tub or shower regularly.  Attach bath mats securely with double-sided non-slip rug tape.  Do not have throw rugs and other things on the floor that can make you trip. What can I do in the bedroom?  Use night lights.  Make sure that you have a light by your bed that is easy to reach.  Do not use any sheets or blankets that are too big for your bed. They should not hang down onto the floor.  Have a firm chair that has side arms. You can use this for support while you get dressed.  Do not have throw rugs and other things on the floor that can make you trip. What can I do in the kitchen?  Clean up any spills right away.  Avoid walking on wet floors.  Keep items that you use a lot in easy-to-reach places.  If you need to reach something above you, use a strong step stool that has a grab bar.  Keep electrical cords out of the way.  Do not use floor polish or wax that makes floors slippery. If you must use wax, use non-skid floor wax.  Do not have throw rugs and other things on the floor that can make you trip.  What can I do with my stairs?  Do not leave any items on the stairs.  Make sure that there are handrails on both sides of the stairs and use them. Fix handrails that are broken or loose. Make sure that handrails are as long as the stairways.  Check any carpeting to make sure that it is firmly attached to the stairs. Fix any carpet that is loose or worn.  Avoid having throw rugs at the top or bottom of the stairs. If you do have throw rugs, attach them to the floor with carpet tape.  Make sure that you have a light switch at the top of the stairs and the bottom of the stairs. If you do not have them, ask someone to add them for you. What else can I do to help prevent falls?  Wear shoes that:  Do not have high heels.  Have rubber bottoms.  Are  comfortable and fit you well.  Are closed at the toe. Do not wear sandals.  If you use a stepladder:  Make sure that it is fully opened. Do not climb a closed stepladder.  Make sure that both sides of the stepladder are locked into place.  Ask someone to hold it for you, if possible.  Clearly mark and make sure that you can see:  Any grab bars or handrails.  First and last steps.  Where the edge of each step is.  Use tools that help you move around (mobility aids) if they are needed. These include:  Canes.  Walkers.  Scooters.  Crutches.  Turn on the lights when you go into a dark area. Replace any light bulbs as soon as they burn out.  Set up your furniture so you have a clear path. Avoid moving your furniture around.  If any of your floors are uneven, fix them.  If there are any pets around you, be aware of where they are.  Review your medicines with your doctor. Some medicines can make you feel dizzy. This can increase your chance of falling. Ask your doctor what other things that you can do to help prevent falls. This information is not intended to replace advice given to you by your health care provider. Make sure you discuss any questions you have with your health care provider. Document Released: 06/22/2009 Document Revised: 02/01/2016 Document Reviewed: 09/30/2014 Elsevier Interactive Patient Education  2017 [place] Maintenance, Female Adopting a healthy lifestyle and getting preventive care can go a long way to promote health and wellness. Talk with your health care provider about what schedule of regular examinations is right for you. This is a good chance for you to check in with your provider about disease prevention and staying healthy. In between checkups, there are plenty of things you can do on your own. Experts have done a lot of research about which lifestyle changes and preventive measures are most likely to keep you healthy. Ask your  health care provider for more information. Weight and diet Eat a healthy diet  Be sure to include plenty of vegetables, fruits, low-fat dairy products, and lean protein.  Do not eat a lot of foods high in solid fats, added sugars, or salt.  Get regular exercise. This is one of the most important things you can do for your health.  Most adults should exercise for at least 150 minutes each week. The exercise should increase your heart rate and make you sweat (moderate-intensity exercise).  Most adults should also  do strengthening exercises at least twice a week. This is in addition to the moderate-intensity exercise. Maintain a healthy weight  Body mass index (BMI) is a measurement that can be used to identify possible weight problems. It estimates body fat based on height and weight. Your health care provider can help determine your BMI and help you achieve or maintain a healthy weight.  For females 5 years of age and older:  A BMI below 18.5 is considered underweight.  A BMI of 18.5 to 24.9 is normal.  A BMI of 25 to 29.9 is considered overweight.  A BMI of 30 and above is considered obese. Watch levels of cholesterol and blood lipids  You should start having your blood tested for lipids and cholesterol at 71 years of age, then have this test every 5 years.  You may need to have your cholesterol levels checked more often if:  Your lipid or cholesterol levels are high.  You are older than 71 years of age.  You are at high risk for heart disease. Cancer screening Lung Cancer  Lung cancer screening is recommended for adults 60-99 years old who are at high risk for lung cancer because of a history of smoking.  A yearly low-dose CT scan of the lungs is recommended for people who:  Currently smoke.  Have quit within the past 15 years.  Have at least a 30-pack-year history of smoking. A pack year is smoking an average of one pack of cigarettes a day for 1 year.  Yearly  screening should continue until it has been 15 years since you quit.  Yearly screening should stop if you develop a health problem that would prevent you from having lung cancer treatment. Breast Cancer  Practice breast self-awareness. This means understanding how your breasts normally appear and feel.  It also means doing regular breast self-exams. Let your health care provider know about any changes, no matter how small.  If you are in your 20s or 30s, you should have a clinical breast exam (CBE) by a health care provider every 1-3 years as part of a regular health exam.  If you are 61 or older, have a CBE every year. Also consider having a breast X-ray (mammogram) every year.  If you have a family history of breast cancer, talk to your health care provider about genetic screening.  If you are at high risk for breast cancer, talk to your health care provider about having an MRI and a mammogram every year.  Breast cancer gene (BRCA) assessment is recommended for women who have family members with BRCA-related cancers. BRCA-related cancers include:  Breast.  Ovarian.  Tubal.  Peritoneal cancers.  Results of the assessment will determine the need for genetic counseling and BRCA1 and BRCA2 testing. Cervical Cancer  Your health care provider may recommend that you be screened regularly for cancer of the pelvic organs (ovaries, uterus, and vagina). This screening involves a pelvic examination, including checking for microscopic changes to the surface of your cervix (Pap test). You may be encouraged to have this screening done every 3 years, beginning at age 26.  For women ages 61-65, health care providers may recommend pelvic exams and Pap testing every 3 years, or they may recommend the Pap and pelvic exam, combined with testing for human papilloma virus (HPV), every 5 years. Some types of HPV increase your risk of cervical cancer. Testing for HPV may also be done on women of any age with  unclear Pap test results.  Other health care providers may not recommend any screening for nonpregnant women who are considered low risk for pelvic cancer and who do not have symptoms. Ask your health care provider if a screening pelvic exam is right for you.  If you have had past treatment for cervical cancer or a condition that could lead to cancer, you need Pap tests and screening for cancer for at least 20 years after your treatment. If Pap tests have been discontinued, your risk factors (such as having a new sexual partner) need to be reassessed to determine if screening should resume. Some women have medical problems that increase the chance of getting cervical cancer. In these cases, your health care provider may recommend more frequent screening and Pap tests. Colorectal Cancer  This type of cancer can be detected and often prevented.  Routine colorectal cancer screening usually begins at 71 years of age and continues through 71 years of age.  Your health care provider may recommend screening at an earlier age if you have risk factors for colon cancer.  Your health care provider may also recommend using home test kits to check for hidden blood in the stool.  A small camera at the end of a tube can be used to examine your colon directly (sigmoidoscopy or colonoscopy). This is done to check for the earliest forms of colorectal cancer.  Routine screening usually begins at age 8.  Direct examination of the colon should be repeated every 5-10 years through 71 years of age. However, you may need to be screened more often if early forms of precancerous polyps or small growths are found. Skin Cancer  Check your skin from head to toe regularly.  Tell your health care provider about any new moles or changes in moles, especially if there is a change in a mole's shape or color.  Also tell your health care provider if you have a mole that is larger than the size of a pencil eraser.  Always  use sunscreen. Apply sunscreen liberally and repeatedly throughout the day.  Protect yourself by wearing long sleeves, pants, a wide-brimmed hat, and sunglasses whenever you are outside. Heart disease, diabetes, and high blood pressure  High blood pressure causes heart disease and increases the risk of stroke. High blood pressure is more likely to develop in:  People who have blood pressure in the high end of the normal range (130-139/85-89 mm Hg).  People who are overweight or obese.  People who are African American.  If you are 50-24 years of age, have your blood pressure checked every 3-5 years. If you are 88 years of age or older, have your blood pressure checked every year. You should have your blood pressure measured twice-once when you are at a hospital or clinic, and once when you are not at a hospital or clinic. Record the average of the two measurements. To check your blood pressure when you are not at a hospital or clinic, you can use:  An automated blood pressure machine at a pharmacy.  A home blood pressure monitor.  If you are between 53 years and 60 years old, ask your health care provider if you should take aspirin to prevent strokes.  Have regular diabetes screenings. This involves taking a blood sample to check your fasting blood sugar level.  If you are at a normal weight and have a low risk for diabetes, have this test once every three years after 71 years of age.  If you are overweight and have  a high risk for diabetes, consider being tested at a younger age or more often. Preventing infection Hepatitis B  If you have a higher risk for hepatitis B, you should be screened for this virus. You are considered at high risk for hepatitis B if:  You were born in a country where hepatitis B is common. Ask your health care provider which countries are considered high risk.  Your parents were born in a high-risk country, and you have not been immunized against hepatitis B  (hepatitis B vaccine).  You have HIV or AIDS.  You use needles to inject street drugs.  You live with someone who has hepatitis B.  You have had sex with someone who has hepatitis B.  You get hemodialysis treatment.  You take certain medicines for conditions, including cancer, organ transplantation, and autoimmune conditions. Hepatitis C  Blood testing is recommended for:  Everyone born from [birth year] through 1965.  Anyone with known risk factors for hepatitis C. Sexually transmitted infections (STIs)  You should be screened for sexually transmitted infections (STIs) including gonorrhea and chlamydia if:  You are sexually active and are younger than 71 years of age.  You are older than 71 years of age and your health care provider tells you that you are at risk for this type of infection.  Your sexual activity has changed since you were last screened and you are at an increased risk for chlamydia or gonorrhea. Ask your health care provider if you are at risk.  If you do not have HIV, but are at risk, it may be recommended that you take a prescription medicine daily to prevent HIV infection. This is called pre-exposure prophylaxis (PrEP). You are considered at risk if:  You are sexually active and do not regularly use condoms or know the HIV status of your partner(s).  You take drugs by injection.  You are sexually active with a partner who has HIV. Talk with your health care provider about whether you are at high risk of being infected with HIV. If you choose to begin PrEP, you should first be tested for HIV. You should then be tested every 3 months for as long as you are taking PrEP. Pregnancy  If you are premenopausal and you may become pregnant, ask your health care provider about preconception counseling.  If you may become pregnant, take 400 to 800 micrograms (mcg) of folic acid every day.  If you want to prevent pregnancy, talk to your health care provider about birth  control (contraception). Osteoporosis and menopause  Osteoporosis is a disease in which the bones lose minerals and strength with aging. This can result in serious bone fractures. Your risk for osteoporosis can be identified using a bone density scan.  If you are 25 years of age or older, or if you are at risk for osteoporosis and fractures, ask your health care provider if you should be screened.  Ask your health care provider whether you should take a calcium or vitamin D supplement to lower your risk for osteoporosis.  Menopause may have certain physical symptoms and risks.  Hormone replacement therapy may reduce some of these symptoms and risks. Talk to your health care provider about whether hormone replacement therapy is right for you. Follow these instructions at home:  Schedule regular health, dental, and eye exams.  Stay current with your immunizations.  Do not use any tobacco products including cigarettes, chewing tobacco, or electronic cigarettes.  If you are pregnant, do not drink alcohol.  If you are breastfeeding, limit how much and how often you drink alcohol.  Limit alcohol intake to no more than 1 drink per day for nonpregnant women. One drink equals 12 ounces of beer, 5 ounces of wine, or 1 ounces of hard liquor.  Do not use street drugs.  Do not share needles.  Ask your health care provider for help if you need support or information about quitting drugs.  Tell your health care provider if you often feel depressed.  Tell your health care provider if you have ever been abused or do not feel safe at home. This information is not intended to replace advice given to you by your health care provider. Make sure you discuss any questions you have with your health care provider. Document Released: 03/11/2011 Document Revised: 02/01/2016 Document Reviewed: 05/30/2015 Elsevier Interactive Patient Education  2017 [employer].

## 2016-11-14 LAB — URINE CULTURE

## 2016-11-15 ENCOUNTER — Telehealth: Payer: Self-pay | Admitting: *Deleted

## 2016-11-15 DIAGNOSIS — Z0000 Encounter for general adult medical examination without abnormal findings: Secondary | ICD-10-CM

## 2016-11-15 NOTE — Addendum Note (Signed)
Addended by: Katina Dung on: 11/15/2016 04:17 PM   Modules accepted: Orders

## 2016-11-15 NOTE — Telephone Encounter (Signed)
Patient states that insurance will cover Hep C lab draw.   She would like to have this done at [facility].

## 2016-11-15 NOTE — Telephone Encounter (Signed)
Ok with me 

## 2016-11-15 NOTE — Telephone Encounter (Signed)
Is ok to replace Hep c order.

## 2016-11-20 ENCOUNTER — Encounter: Payer: Self-pay | Admitting: Physician Assistant

## 2016-11-20 DIAGNOSIS — Z780 Asymptomatic menopausal state: Secondary | ICD-10-CM | POA: Diagnosis not present

## 2016-11-20 DIAGNOSIS — Z803 Family history of malignant neoplasm of breast: Secondary | ICD-10-CM | POA: Diagnosis not present

## 2016-11-20 DIAGNOSIS — M8589 Other specified disorders of bone density and structure, multiple sites: Secondary | ICD-10-CM | POA: Diagnosis not present

## 2016-11-20 DIAGNOSIS — Z1231 Encounter for screening mammogram for malignant neoplasm of breast: Secondary | ICD-10-CM | POA: Diagnosis not present

## 2016-11-20 LAB — HM MAMMOGRAPHY

## 2016-11-21 NOTE — Progress Notes (Signed)
RN MWV note reviewed.  Brewster Wolters Cody, PA-C  

## 2016-11-25 ENCOUNTER — Encounter: Payer: Self-pay | Admitting: Emergency Medicine

## 2016-11-28 ENCOUNTER — Telehealth: Payer: Self-pay | Admitting: Physician Assistant

## 2016-11-28 ENCOUNTER — Encounter: Payer: Self-pay | Admitting: Physician Assistant

## 2016-11-28 DIAGNOSIS — M8580 Other specified disorders of bone density and structure, unspecified site: Secondary | ICD-10-CM | POA: Insufficient documentation

## 2016-11-28 NOTE — Telephone Encounter (Signed)
Called patient but the phone does not have a VM to leave a message. Will call back later.

## 2016-11-28 NOTE — Telephone Encounter (Signed)
Pt would like to transfer back to [facility] prefer to have a MD.  Pt would like to see if Dr. Raliegh Ip would have her back and [place],PA will release her?  Pt is aware that Dr. Raliegh Ip is out of the office and will be back on Wednesday 12/04/16.

## 2016-11-28 NOTE — Telephone Encounter (Signed)
Bone Density results received -- Osteopenia.  Needs calcium and vitamin D supplementation daily. I see she is already on a calcium supplement. Would have her start a 800 unit Vitamin D3 supplement daily.

## 2016-11-29 ENCOUNTER — Telehealth: Payer: Self-pay | Admitting: Emergency Medicine

## 2016-11-29 ENCOUNTER — Other Ambulatory Visit: Payer: Self-pay | Admitting: Emergency Medicine

## 2016-11-29 DIAGNOSIS — M8580 Other specified disorders of bone density and structure, unspecified site: Secondary | ICD-10-CM

## 2016-11-29 MED ORDER — VITAMIN D3 25 MCG (1000 UT) PO CAPS: 1.0000 | ORAL_CAPSULE | Freq: Every day | ORAL | 0 refills | Status: DC

## 2016-11-29 NOTE — Telephone Encounter (Signed)
Patient advised of dexa showing osteopenia. Start Vitamin D3 1000 daily. She is agreeable

## 2016-11-29 NOTE — Telephone Encounter (Signed)
Patient states she has already been taking the Vitamin D3 1000 iu daily for the last 2-3 years. Along with the MVI. Is there something else she can take to keep her bones strong.

## 2016-11-29 NOTE — Telephone Encounter (Signed)
Fine with me

## 2016-11-30 NOTE — Telephone Encounter (Signed)
I would recommend continuing that dose for now and [place] checking an actually Vitamin D level to make sure it is running in the normal range. I see she is wanting to transfer back to Dr. Raliegh Ip so we can certainly draw at our lab for her or if she wants to wait and discuss with him that is fine with me. Whatever works best/easiest for her.

## 2016-12-02 ENCOUNTER — Telehealth: Payer: Self-pay | Admitting: Physician Assistant

## 2016-12-02 NOTE — Telephone Encounter (Signed)
Patient states pharmacy called her regarding a rx that was ready for pick up, Cholecalciferol (VITAMIN D3) 1000 units CAPS.  She wants to know why a rx for this med was called in to the pharmacy for this med.

## 2016-12-04 NOTE — Telephone Encounter (Signed)
okay

## 2016-12-04 NOTE — Telephone Encounter (Signed)
Called pt to let her know and no answer and not answering machine.

## 2016-12-04 NOTE — Telephone Encounter (Signed)
Pt is aware dr Raliegh Ip will accept her back. Pt will callback to sch

## 2016-12-12 NOTE — Telephone Encounter (Signed)
Patient advised.

## 2017-01-08 DIAGNOSIS — N762 Acute vulvitis: Secondary | ICD-10-CM | POA: Diagnosis not present

## 2017-04-09 DIAGNOSIS — R69 Illness, unspecified: Secondary | ICD-10-CM | POA: Diagnosis not present

## 2017-05-20 ENCOUNTER — Ambulatory Visit (INDEPENDENT_AMBULATORY_CARE_PROVIDER_SITE_OTHER): Payer: Medicare HMO

## 2017-05-20 DIAGNOSIS — Z23 Encounter for immunization: Secondary | ICD-10-CM

## 2017-08-13 ENCOUNTER — Ambulatory Visit: Payer: Self-pay | Admitting: *Deleted

## 2017-08-13 ENCOUNTER — Ambulatory Visit (INDEPENDENT_AMBULATORY_CARE_PROVIDER_SITE_OTHER): Payer: Medicare HMO | Admitting: Internal Medicine

## 2017-08-13 ENCOUNTER — Encounter: Payer: Self-pay | Admitting: Internal Medicine

## 2017-08-13 VITALS — BP 148/74 | HR 81 | Temp 97.8°F | Wt 168.6 lb

## 2017-08-13 DIAGNOSIS — M545 Low back pain, unspecified: Secondary | ICD-10-CM

## 2017-08-13 DIAGNOSIS — R319 Hematuria, unspecified: Secondary | ICD-10-CM

## 2017-08-13 DIAGNOSIS — N390 Urinary tract infection, site not specified: Secondary | ICD-10-CM | POA: Diagnosis not present

## 2017-08-13 DIAGNOSIS — R350 Frequency of micturition: Secondary | ICD-10-CM | POA: Diagnosis not present

## 2017-08-13 LAB — POCT URINALYSIS DIPSTICK
BILIRUBIN UA: NEGATIVE
Glucose, UA: NEGATIVE
KETONES UA: NEGATIVE
Nitrite, UA: NEGATIVE
PH UA: 6 (ref 5.0–8.0)
Protein, UA: NEGATIVE
RBC UA: NEGATIVE
SPEC GRAV UA: 1.01 (ref 1.010–1.025)
Urobilinogen, UA: 0.2 E.U./dL

## 2017-08-13 MED ORDER — SULFAMETHOXAZOLE-TRIMETHOPRIM 800-160 MG PO TABS: 1.0000 | ORAL_TABLET | Freq: Two times a day (BID) | ORAL | 0 refills | Status: DC

## 2017-08-13 NOTE — Progress Notes (Signed)
Chief Complaint  Patient presents with  . Urinary Frequency    started about 3 days ago with burning. Having lower abd pain and lower back pain with spasms. Drinking lots of water and cranberry juice.     HPI: Megan Pham 71 y.o. sda  pcpp NA sore stomach and back  And spasm.   Worse with position.  Bending over  And   No injury goes to gynm regulalry  Gets utis.  Cranberry juice.  no fever  ROS: See pertinent positives and negatives per HPI. No fever  Chills  hematuria  Past Medical History:  Diagnosis Date  . Allergy    Seasonal  . Arthritis   . Ulcer 1979   ulcerative colitis    Family History  Problem Relation Age of Onset  . Arthritis Mother   . Cancer Mother        lung  . Arthritis Father   . Cancer Father        lung  . Arthritis Maternal Aunt   . Cancer Maternal Aunt        breast  . Arthritis Maternal Uncle   . Arthritis Paternal Aunt   . Arthritis Paternal Uncle   . Arthritis Maternal Grandmother   . Arthritis Maternal Grandfather   . Arthritis Paternal Grandmother   . Cancer Paternal Grandmother        breast  . Arthritis Paternal Grandfather   . Arthritis Son   . Healthy Grandchild        x 2  . Colon cancer Neg Hx     Social History   Socioeconomic History  . Marital status: Married    Spouse name: None  . Number of children: None  . Years of education: None  . Highest education level: None  Social Needs  . Financial resource strain: None  . Food insecurity - worry: None  . Food insecurity - inability: None  . Transportation needs - medical: None  . Transportation needs - non-medical: None  Occupational History  . Occupation: retired  Tobacco Use  . Smoking status: Former Smoker    Last attempt to quit: 09/09/2006    Years since quitting: 10.9  . Smokeless tobacco: Never Used  Substance and Sexual Activity  . Alcohol use: No  . Drug use: No  . Sexual activity: No  Other Topics Concern  . None  Social History Narrative  .  None    Outpatient Medications Prior to Visit  Medication Sig Dispense Refill  . acetaminophen (ARTHRITIS PAIN RELIEF) 650 MG CR tablet Take 650 mg by mouth daily. Reported on 12/04/2015    . Cholecalciferol (VITAMIN D3 PO) Take by mouth daily.    . Multiple Vitamins-Minerals (CENTRUM SILVER ADULT 50+) TABS Take 1 tablet by mouth daily.    Marland Kitchen triamcinolone ointment (KENALOG) 0.1 % Apply 1 application topically 2 (two) times daily.    Marland Kitchen CALCIUM-MAGNESIUM-ZINC PO Take 1 tablet by mouth daily.    . Cholecalciferol (VITAMIN D3) 1000 units CAPS Take 1 capsule (1,000 Units total) by mouth daily. (Patient not taking: Reported on 08/13/2017) 30 capsule 0  . Multiple Vitamins-Minerals (HAIR/SKIN/NAILS PO) Take 1 tablet by mouth daily.     No facility-administered medications prior to visit.      EXAM:  BP (!) 148/74 (BP Location: Left Arm, Patient Position: Sitting, Cuff Size: Normal)   Pulse 81   Temp 97.8 F (36.6 C) (Oral)   Wt 168 lb 9.6 oz (76.5 kg)  BMI 30.84 kg/m   Body mass index is 30.84 kg/m.  GENERAL: vitals reviewed and listed above, alert, oriented, appears well hydrated and in no acute distress HEENT: atraumatic, conjunctiva  clear, no obvious abnormalities on inspection of external nose and ears  NECK: no obvious masses on inspection palpation  LUNGS: clear to auscultation bilaterally, no wheezes, rales or rhonchi, good air movement CV: HRRR, no clubbing cyanosis or  peripheral edema nl cap refill  Abdomen:  Sof,t normal bowel sounds without hepatosplenomegaly, no guarding rebound or masses no CVA tenderness pain is lower paraspinal   MS: moves all extremities without noticeable focal  abnormality PSYCH: pleasant and cooperative, no obvious depression or anxiety ua po leuk ASSESSMENT AND PLAN:  Discussed the following assessment and plan:  Urinary frequency - Plan: POC Urinalysis Dipstick, Urine Culture, Urine Culture  Urinary tract infection with hematuria, site  unspecified - Plan: Urine Culture, Urine Culture  Acute bilateral low back pain without sciatica - Plan: Urine Culture, Urine Culture  -Patient advised to return or notify health care team  if symptoms worsen ,persist or new concerns arise.  Patient Instructions  Treating for UTI If back pain not getting better could be  From mechanical causes   And limit bending lifting but do your  Other   Exercises .     Standley Brooking. Holdyn Poyser M.D.

## 2017-08-13 NOTE — Telephone Encounter (Signed)
Pt called with complaints of "feeling icky", feeling like she is still full after voiding; pt states that she " started drinking water and cranberry juice but this am started havingstomach ache and lower back sore; spasms across back'; pt states that she would like to see Dr Inda Merlin today or tomorrow; given her symptoms per nurse triage, pt is encouraged to consider seeing another provider in the practice; she is offered and accepts an 1015 appointment with Dr Regis Bill; pt verbalizes understanding; will route to [facility] pool to make them aware of this upcoming appointment; pt is also concerned that she will have to pay additional money since she is unable to see Dr Inda Merlin.   Reason for Disposition . Side (flank) or lower back pain present  Answer Assessment - Initial Assessment Questions 1. SYMPTOM: "What's the main symptom you're concerned about?" (e.g., frequency, incontinence)     Feels like unable to empty completely 2. ONSET: "When did the  ________  start?"     2 days ago 3. PAIN: "Is there any pain?" If so, ask: "How bad is it?" (Scale: 1-10; mild, moderate, severe)     Described as a pressure 4. CAUSE: "What do you think is causing the symptoms?"     uti 5. OTHER SYMPTOMS: "Do you have any other symptoms?" (e.g., fever, flank pain, blood in urine, pain with urination)     Feels "icky", spasms; urine is cloudy and smelly 6. PREGNANCY: "Is there any chance you are pregnant?" "When was your last menstrual period?"     no  Protocols used: [facility]

## 2017-08-13 NOTE — Patient Instructions (Signed)
Treating for UTI If back pain not getting better could be  From mechanical causes   And limit bending lifting but do your  Other   Exercises .

## 2017-08-14 LAB — URINE CULTURE
MICRO NUMBER: 81368024
SPECIMEN QUALITY:: ADEQUATE

## 2017-08-20 NOTE — Progress Notes (Signed)
Tell pt urine culture showed multiple bacteria but not predominant germ. FU with PCP If still having problems

## 2017-08-25 ENCOUNTER — Encounter: Payer: Self-pay | Admitting: Internal Medicine

## 2017-08-25 ENCOUNTER — Ambulatory Visit (INDEPENDENT_AMBULATORY_CARE_PROVIDER_SITE_OTHER): Payer: Medicare HMO | Admitting: Internal Medicine

## 2017-08-25 VITALS — BP 146/72 | HR 70 | Temp 98.1°F | Ht 62.0 in | Wt 172.8 lb

## 2017-08-25 DIAGNOSIS — R1030 Lower abdominal pain, unspecified: Secondary | ICD-10-CM | POA: Diagnosis not present

## 2017-08-25 NOTE — Progress Notes (Signed)
Subjective:    Patient ID: Megan Pham, female    DOB: March 23, 1946, 71 y.o.   MRN: [id number]  HPI  71 year old patient who was seen on December 5 and treated for suspected UTI.  She presented with mid abdominal and low back pain.  She has been active at her health club with weight training and also set up in these activities were discouraged.  A urine culture revealed no significant growth.  She was treated with 5 days of Septra DS with no benefit.  Today she continues to have some Mid abdominal and low back pain that she attributes to persistent UTI symptoms. She denies any dysuria.  She does describe some mild urgency and urinary frequency.  She does drink coffee in the morning and occasional ice tea.  She does note urgency and frequency after caffeine use  Past Medical History:  Diagnosis Date  . Allergy    Seasonal  . Arthritis   . Ulcer 1979   ulcerative colitis     Social History   Socioeconomic History  . Marital status: Married    Spouse name: Not on file  . Number of children: Not on file  . Years of education: Not on file  . Highest education level: Not on file  Social Needs  . Financial resource strain: Not on file  . Food insecurity - worry: Not on file  . Food insecurity - inability: Not on file  . Transportation needs - medical: Not on file  . Transportation needs - non-medical: Not on file  Occupational History  . Occupation: retired  Tobacco Use  . Smoking status: Former Smoker    Last attempt to quit: 09/09/2006    Years since quitting: 10.9  . Smokeless tobacco: Never Used  Substance and Sexual Activity  . Alcohol use: No  . Drug use: No  . Sexual activity: No  Other Topics Concern  . Not on file  Social History Narrative  . Not on file    Past Surgical History:  Procedure Laterality Date  . ABDOMINAL HYSTERECTOMY  1973  . APPENDECTOMY  1973  . CHOLECYSTECTOMY  2000  . COLONOSCOPY  2004   by Dr.James Weissman-had left sided diverticulosis    . [place]  2004  . TONSILLECTOMY  1970    Family History  Problem Relation Age of Onset  . Arthritis Mother   . Cancer Mother        lung  . Arthritis Father   . Cancer Father        lung  . Arthritis Maternal Aunt   . Cancer Maternal Aunt        breast  . Arthritis Maternal Uncle   . Arthritis Paternal Aunt   . Arthritis Paternal Uncle   . Arthritis Maternal Grandmother   . Arthritis Maternal Grandfather   . Arthritis Paternal Grandmother   . Cancer Paternal Grandmother        breast  . Arthritis Paternal Grandfather   . Arthritis Son   . Healthy Grandchild        x 2  . Colon cancer Neg Hx     Allergies  Allergen Reactions  . Cefoxitin Sodium In Dextrose Hives  . Mefoxin [Cefoxitin] Hives    "Myfoxin" during surgery per pt.     Current Outpatient Medications on File Prior to Visit  Medication Sig Dispense Refill  . acetaminophen (ARTHRITIS PAIN RELIEF) 650 MG CR tablet Take 650 mg by mouth daily. Reported on  12/04/2015    . Cholecalciferol (VITAMIN D3 PO) Take by mouth daily.    . Multiple Vitamins-Minerals (CENTRUM SILVER ADULT 50+) TABS Take 1 tablet by mouth daily.    Marland Kitchen triamcinolone ointment (KENALOG) 0.1 % Apply 1 application topically 2 (two) times daily.    Marland Kitchen sulfamethoxazole-trimethoprim (BACTRIM DS,SEPTRA DS) 800-160 MG tablet Take 1 tablet by mouth 2 (two) times daily. (Patient not taking: Reported on 08/25/2017) 10 tablet 0   No current facility-administered medications on file prior to visit.     BP (!) 146/72 (BP Location: Left Arm, Patient Position: Sitting, Cuff Size: Normal)   Pulse 70   Temp 98.1 F (36.7 C) (Oral)   Ht 5\' 2"  (1.575 m)   Wt 172 lb 12.8 oz (78.4 kg)   SpO2 97%   BMI 31.61 kg/m     Review of Systems  Constitutional: Negative.   HENT: Negative for congestion, dental problem, hearing loss, rhinorrhea, sinus pressure, sore throat and tinnitus.   Eyes: Negative for pain, discharge and visual disturbance.   Respiratory: Negative for cough and shortness of breath.   Cardiovascular: Negative for chest pain, palpitations and leg swelling.  Gastrointestinal: Positive for abdominal pain. Negative for abdominal distention, blood in stool, constipation, diarrhea, nausea and vomiting.  Genitourinary: Positive for frequency and urgency. Negative for difficulty urinating, dysuria, flank pain, hematuria, pelvic pain, vaginal bleeding, vaginal discharge and vaginal pain.  Musculoskeletal: Positive for back pain. Negative for arthralgias, gait problem and joint swelling.  Skin: Negative for rash.  Neurological: Negative for dizziness, syncope, speech difficulty, weakness, numbness and headaches.  Hematological: Negative for adenopathy.  Psychiatric/Behavioral: Negative for agitation, behavioral problems and dysphoric mood. The patient is not nervous/anxious.        Objective:   Physical Exam  Constitutional: She is oriented to person, place, and time. She appears well-developed and well-nourished.  HENT:  Head: Normocephalic.  Right Ear: External ear normal.  Left Ear: External ear normal.  Mouth/Throat: Oropharynx is clear and moist.  Eyes: Conjunctivae and EOM are normal. Pupils are equal, round, and reactive to light.  Neck: Normal range of motion. Neck supple. No thyromegaly present.  Cardiovascular: Normal rate, regular rhythm, normal heart sounds and intact distal pulses.  Pulmonary/Chest: Effort normal and breath sounds normal.  Abdominal: Soft. Bowel sounds are normal. She exhibits no distension and no mass. There is no tenderness. There is no rebound.  Musculoskeletal: Normal range of motion.  Lymphadenopathy:    She has no cervical adenopathy.  Neurological: She is alert and oriented to person, place, and time.  Skin: Skin is warm and dry. No rash noted.  Psychiatric: She has a normal mood and affect. Her behavior is normal.          Assessment & Plan:   Nonspecific abdominal pain  and low back pain.  Patient will continue to moderate her caffeine use and also activities at the gym.  We will clinically observe at this time.  CPX in 3 months patient report any new or worsening symptoms  Megan Pham

## 2017-08-25 NOTE — Patient Instructions (Signed)
Call or return to clinic prn if these symptoms worsen or fail to improve as anticipated.  Return in 3 months for your annual exam

## 2017-10-14 DIAGNOSIS — R69 Illness, unspecified: Secondary | ICD-10-CM | POA: Diagnosis not present

## 2017-10-17 DIAGNOSIS — R69 Illness, unspecified: Secondary | ICD-10-CM | POA: Diagnosis not present

## 2017-11-21 DIAGNOSIS — Z1231 Encounter for screening mammogram for malignant neoplasm of breast: Secondary | ICD-10-CM | POA: Diagnosis not present

## 2017-11-21 DIAGNOSIS — Z803 Family history of malignant neoplasm of breast: Secondary | ICD-10-CM | POA: Diagnosis not present

## 2017-12-15 ENCOUNTER — Ambulatory Visit (INDEPENDENT_AMBULATORY_CARE_PROVIDER_SITE_OTHER): Payer: Medicare HMO | Admitting: Internal Medicine

## 2017-12-15 ENCOUNTER — Encounter: Payer: Self-pay | Admitting: Internal Medicine

## 2017-12-15 VITALS — BP 130/60 | HR 88 | Temp 97.9°F | Ht 61.5 in | Wt 172.0 lb

## 2017-12-15 DIAGNOSIS — M8580 Other specified disorders of bone density and structure, unspecified site: Secondary | ICD-10-CM

## 2017-12-15 DIAGNOSIS — M8949 Other hypertrophic osteoarthropathy, multiple sites: Secondary | ICD-10-CM

## 2017-12-15 DIAGNOSIS — M150 Primary generalized (osteo)arthritis: Secondary | ICD-10-CM

## 2017-12-15 DIAGNOSIS — Z0001 Encounter for general adult medical examination with abnormal findings: Secondary | ICD-10-CM

## 2017-12-15 DIAGNOSIS — R7302 Impaired glucose tolerance (oral): Secondary | ICD-10-CM | POA: Diagnosis not present

## 2017-12-15 DIAGNOSIS — R059 Cough, unspecified: Secondary | ICD-10-CM | POA: Insufficient documentation

## 2017-12-15 DIAGNOSIS — M159 Polyosteoarthritis, unspecified: Secondary | ICD-10-CM

## 2017-12-15 DIAGNOSIS — R0989 Other specified symptoms and signs involving the circulatory and respiratory systems: Secondary | ICD-10-CM | POA: Insufficient documentation

## 2017-12-15 DIAGNOSIS — R05 Cough: Secondary | ICD-10-CM | POA: Insufficient documentation

## 2017-12-15 LAB — CBC WITH DIFFERENTIAL/PLATELET
BASOS PCT: 0.6 % (ref 0.0–3.0)
Basophils Absolute: 0 10*3/uL (ref 0.0–0.1)
EOS ABS: 0.1 10*3/uL (ref 0.0–0.7)
Eosinophils Relative: 1.1 % (ref 0.0–5.0)
HCT: 42.3 % (ref 36.0–46.0)
HEMOGLOBIN: 14.4 g/dL (ref 12.0–15.0)
Lymphocytes Relative: 50.6 % — ABNORMAL HIGH (ref 12.0–46.0)
Lymphs Abs: 3 10*3/uL (ref 0.7–4.0)
MCHC: 34.2 g/dL (ref 30.0–36.0)
MCV: 91.4 fl (ref 78.0–100.0)
Monocytes Absolute: 0.4 10*3/uL (ref 0.1–1.0)
Monocytes Relative: 7.4 % (ref 3.0–12.0)
Neutro Abs: 2.4 10*3/uL (ref 1.4–7.7)
Neutrophils Relative %: 40.3 % — ABNORMAL LOW (ref 43.0–77.0)
Platelets: 323 10*3/uL (ref 150.0–400.0)
RBC: 4.63 Mil/uL (ref 3.87–5.11)
RDW: 12 % (ref 11.5–15.5)
WBC: 5.9 10*3/uL (ref 4.0–10.5)

## 2017-12-15 LAB — LIPID PANEL
CHOLESTEROL: 215 mg/dL — AB (ref 0–200)
HDL: 56.1 mg/dL (ref >39.00)
LDL Cholesterol: 121 mg/dL — ABNORMAL HIGH (ref 0–99)
NonHDL: 158.61
Total CHOL/HDL Ratio: 4
Triglycerides: 186 mg/dL — ABNORMAL HIGH (ref 0.0–149.0)
VLDL: 37.2 mg/dL (ref 0.0–40.0)

## 2017-12-15 LAB — COMPREHENSIVE METABOLIC PANEL
ALBUMIN: 4.6 g/dL (ref 3.5–5.2)
ALK PHOS: 96 U/L (ref 39–117)
ALT: 23 U/L (ref 0–35)
AST: 22 U/L (ref 0–37)
BUN: 16 mg/dL (ref 6–23)
CHLORIDE: 102 meq/L (ref 96–112)
CO2: 28 mEq/L (ref 19–32)
CREATININE: 0.67 mg/dL (ref 0.40–1.20)
Calcium: 9.6 mg/dL (ref 8.4–10.5)
GFR: 91.93 mL/min (ref >60.00)
Glucose, Bld: 96 mg/dL (ref 70–99)
Potassium: 4.7 mEq/L (ref 3.5–5.1)
SODIUM: 140 meq/L (ref 135–145)
TOTAL PROTEIN: 7.7 g/dL (ref 6.0–8.3)
Total Bilirubin: 0.4 mg/dL (ref 0.2–1.2)

## 2017-12-15 LAB — HEMOGLOBIN A1C: Hgb A1c MFr Bld: 5.8 % (ref 4.6–6.5)

## 2017-12-15 LAB — TSH: TSH: 1.77 u[IU]/mL (ref 0.35–4.50)

## 2017-12-15 NOTE — Progress Notes (Signed)
Subjective:    Patient ID: Megan Pham, female    DOB: [birth year], 72 y.o.   MRN: [id number]  HPI  72 year old patient who is seen today for an annual health assessment and subsequent Medicare wellness visit She enjoys excellent health.  She has a history of mild impaired glucose tolerance.  She has had a recent 3D mammogram that was unremarkable. She has a history of mild DJD. No major concerns or complaints today She does have some mild allergy issues  Colonoscopy 2017   Past Medical History:  Diagnosis Date  . Allergy    Seasonal  . Arthritis   . Ulcer 1979   ulcerative colitis     Social History   Socioeconomic History  . Marital status: Married    Spouse name: Not on file  . Number of children: Not on file  . Years of education: Not on file  . Highest education level: Not on file  Occupational History  . Occupation: retired  Scientific laboratory technician  . Financial resource strain: Not on file  . Food insecurity:    Worry: Not on file    Inability: Not on file  . Transportation needs:    Medical: Not on file    Non-medical: Not on file  Tobacco Use  . Smoking status: Former Smoker    Last attempt to quit: 09/09/2006    Years since quitting: 11.2  . Smokeless tobacco: Never Used  Substance and Sexual Activity  . Alcohol use: No  . Drug use: No  . Sexual activity: Never  Lifestyle  . Physical activity:    Days per week: Not on file    Minutes per session: Not on file  . Stress: Not on file  Relationships  . Social connections:    Talks on phone: Not on file    Gets together: Not on file    Attends religious service: Not on file    Active member of club or organization: Not on file    Attends meetings of clubs or organizations: Not on file    Relationship status: Not on file  . Intimate partner violence:    Fear of current or ex partner: Not on file    Emotionally abused: Not on file    Physically abused: Not on file    Forced sexual activity: Not on file    Other Topics Concern  . Not on file  Social History Narrative  . Not on file    Past Surgical History:  Procedure Laterality Date  . ABDOMINAL HYSTERECTOMY  1973  . APPENDECTOMY  1973  . CHOLECYSTECTOMY  2000  . COLONOSCOPY  2004   by Dr.James Weissman-had left sided diverticulosis  . [place]  2004  . TONSILLECTOMY  1970    Family History  Problem Relation Age of Onset  . Arthritis Mother   . Cancer Mother        lung  . Arthritis Father   . Cancer Father        lung  . Arthritis Maternal Aunt   . Cancer Maternal Aunt        breast  . Arthritis Maternal Uncle   . Arthritis Paternal Aunt   . Arthritis Paternal Uncle   . Arthritis Maternal Grandmother   . Arthritis Maternal Grandfather   . Arthritis Paternal Grandmother   . Cancer Paternal Grandmother        breast  . Arthritis Paternal Grandfather   . Arthritis Son   .  Healthy Grandchild        x 2  . Colon cancer Neg Hx     Allergies  Allergen Reactions  . Cefoxitin Sodium In Dextrose Hives  . Mefoxin [Cefoxitin] Hives    "Myfoxin" during surgery per pt.     Current Outpatient Medications on File Prior to Visit  Medication Sig Dispense Refill  . acetaminophen (ARTHRITIS PAIN RELIEF) 650 MG CR tablet Take 650 mg by mouth daily. Reported on 12/04/2015    . Multiple Vitamins-Minerals (CENTRUM SILVER ADULT 50+) TABS Take 1 tablet by mouth daily.    Marland Kitchen triamcinolone ointment (KENALOG) 0.1 % Apply 1 application topically 2 (two) times daily.     No current facility-administered medications on file prior to visit.     BP 130/60 (BP Location: Right Arm, Patient Position: Sitting, Cuff Size: Large)   Pulse 88   Temp 97.9 F (36.6 C) (Oral)   Ht 5' 1.5" (1.562 m)   Wt 172 lb (78 kg)   SpO2 96%   BMI 31.97 kg/m   Subsequent Medicare wellness visit  1. Risk factors, based on past  M,S,F history.  No significant cardiovascular risk factors.  She does have mild impaired glucose tolerance remains    2.  Physical activities: [facility]  She does her own yard work and also walks her dog in the park    3.  Depression/mood: No history of major depression or mood disorder  4.  Hearing: Uses bilateral hearing aids  5.  ADL's: Independent in all aspects of daily living  6.  Fall risk: Low  7.  Home safety: No problems identified  8.  Height weight, and visual acuity; height and weight stable no change in visual acuity uses reading glasses.  No recent eye evaluation  9.  Counseling: Annual ophthalmology visit encouraged  10. Lab orders based on risk factors: Laboratory update will be reviewed  11. Referral : Ophthalmology  12. Care plan: Continue efforts at aggressive risk factor modification weight loss encouraged  13. Cognitive assessment: Alert and appropriate normal affect.  No cognitive dysfunction  14. Screening: Patient provided with a written and personalized 5-10 year screening schedule in the AVS.    15. Provider List Update: Primary care and GI as well as radiology    Review of Systems  Constitutional: Negative.   HENT: Negative for congestion, dental problem, hearing loss, rhinorrhea, sinus pressure, sore throat and tinnitus.   Eyes: Negative for pain, discharge and visual disturbance.  Respiratory: Negative for cough and shortness of breath.   Cardiovascular: Negative for chest pain, palpitations and leg swelling.  Gastrointestinal: Negative for abdominal distention, abdominal pain, blood in stool, constipation, diarrhea, nausea and vomiting.  Genitourinary: Negative for difficulty urinating, dysuria, flank pain, frequency, hematuria, pelvic pain, urgency, vaginal bleeding, vaginal discharge and vaginal pain.  Musculoskeletal: Negative for arthralgias, gait problem and joint swelling.  Skin: Negative for rash.  Neurological: Negative for dizziness, syncope, speech difficulty, weakness, numbness and headaches.  Hematological: Negative  for adenopathy.  Psychiatric/Behavioral: Negative for agitation, behavioral problems and dysphoric mood. The patient is not nervous/anxious.        Objective:   Physical Exam  Constitutional: She is oriented to person, place, and time. She appears well-developed and well-nourished.  Weight 172 Blood pressure 130/70  HENT:  Head: Normocephalic and atraumatic.  Right Ear: External ear normal.  Left Ear: External ear normal.  Mouth/Throat: Oropharynx is clear and moist.  Eyes:  Conjunctivae and EOM are normal.  Neck: Normal range of motion. Neck supple. No JVD present. No thyromegaly present.  Cardiovascular: Normal rate, regular rhythm, normal heart sounds and intact distal pulses.  No murmur heard. Slight decreased right dorsalis pedis pulse  Pulmonary/Chest: Effort normal and breath sounds normal. She has no wheezes. She has no rales.  Abdominal: Soft. Bowel sounds are normal. She exhibits no distension and no mass. There is no tenderness. There is no rebound and no guarding.  Genitourinary: Vagina normal.  Musculoskeletal: Normal range of motion. She exhibits no edema or tenderness.  Neurological: She is alert and oriented to person, place, and time. She has normal reflexes. No cranial nerve deficit. She exhibits normal muscle tone. Coordination normal.  Skin: Skin is warm and dry. No rash noted.  Psychiatric: She has a normal mood and affect. Her behavior is normal.          Assessment & Plan:   Preventive health examination Subsequent Medicare wellness visit Osteoarthritis History mild osteopenia  Impaired glucose tolerance  We will check updated lab including hemoglobin A1c Follow-up 1 year or as needed Continue annual mammograms Annual ophthalmology visit and encouraged  [name]

## 2017-12-15 NOTE — Patient Instructions (Addendum)
Please see your eye doctor yearly     It is important that you exercise regularly, at least 20 minutes 3 to 4 times per week.  If you develop chest pain or shortness of breath seek  medical attention.  Return in one year for follow-up      Health Maintenance for Postmenopausal Women Menopause is a normal process in which your reproductive ability comes to an end. This process happens gradually over a span of months to years, usually between the ages of 42 and 48. Menopause is complete when you have missed 12 consecutive menstrual periods. It is important to talk with your health care provider about some of the most common conditions that affect postmenopausal women, such as heart disease, cancer, and bone loss (osteoporosis). Adopting a healthy lifestyle and getting preventive care can help to promote your health and wellness. Those actions can also lower your chances of developing some of these common conditions. What should I know about menopause? During menopause, you may experience a number of symptoms, such as:  Moderate-to-severe hot flashes.  Night sweats.  Decrease in sex drive.  Mood swings.  Headaches.  Tiredness.  Irritability.  Memory problems.  Insomnia.  Choosing to treat or not to treat menopausal changes is an individual decision that you make with your health care provider. What should I know about hormone replacement therapy and supplements? Hormone therapy products are effective for treating symptoms that are associated with menopause, such as hot flashes and night sweats. Hormone replacement carries certain risks, especially as you become older. If you are thinking about using estrogen or estrogen with progestin treatments, discuss the benefits and risks with your health care provider. What should I know about heart disease and stroke? Heart disease, heart attack, and stroke become more likely as you age. This may be due, in part, to the hormonal changes that  your body experiences during menopause. These can affect how your body processes dietary fats, triglycerides, and cholesterol. Heart attack and stroke are both medical emergencies. There are many things that you can do to help prevent heart disease and stroke:  Have your blood pressure checked at least every 1-2 years. High blood pressure causes heart disease and increases the risk of stroke.  If you are 29-41 years old, ask your health care provider if you should take aspirin to prevent a heart attack or a stroke.  Do not use any tobacco products, including cigarettes, chewing tobacco, or electronic cigarettes. If you need help quitting, ask your health care provider.  It is important to eat a healthy diet and maintain a healthy weight. ? Be sure to include plenty of vegetables, fruits, low-fat dairy products, and lean protein. ? Avoid eating foods that are high in solid fats, added sugars, or salt (sodium).  Get regular exercise. This is one of the most important things that you can do for your health. ? Try to exercise for at least 150 minutes each week. The type of exercise that you do should increase your heart rate and make you sweat. This is known as moderate-intensity exercise. ? Try to do strengthening exercises at least twice each week. Do these in addition to the moderate-intensity exercise.  Know your numbers.Ask your health care provider to check your cholesterol and your blood glucose. Continue to have your blood tested as directed by your health care provider.  What should I know about cancer screening? There are several types of cancer. Take the following steps to reduce your risk  and to catch any cancer development as early as possible. Breast Cancer  Practice breast self-awareness. ? This means understanding how your breasts normally appear and feel. ? It also means doing regular breast self-exams. Let your health care provider know about any changes, no matter how  small.  If you are 17 or older, have a clinician do a breast exam (clinical breast exam or CBE) every year. Depending on your age, family history, and medical history, it may be recommended that you also have a yearly breast X-ray (mammogram).  If you have a family history of breast cancer, talk with your health care provider about genetic screening.  If you are at high risk for breast cancer, talk with your health care provider about having an MRI and a mammogram every year.  Breast cancer (BRCA) gene test is recommended for women who have family members with BRCA-related cancers. Results of the assessment will determine the need for genetic counseling and BRCA1 and for BRCA2 testing. BRCA-related cancers include these types: ? Breast. This occurs in males or females. ? Ovarian. ? Tubal. This may also be called fallopian tube cancer. ? Cancer of the abdominal or pelvic lining (peritoneal cancer). ? Prostate. ? Pancreatic.  Cervical, Uterine, and Ovarian Cancer Your health care provider may recommend that you be screened regularly for cancer of the pelvic organs. These include your ovaries, uterus, and vagina. This screening involves a pelvic exam, which includes checking for microscopic changes to the surface of your cervix (Pap test).  For women ages 21-65, health care providers may recommend a pelvic exam and a Pap test every three years. For women ages 65-65, they may recommend the Pap test and pelvic exam, combined with testing for human papilloma virus (HPV), every five years. Some types of HPV increase your risk of cervical cancer. Testing for HPV may also be done on women of any age who have unclear Pap test results.  Other health care providers may not recommend any screening for nonpregnant women who are considered low risk for pelvic cancer and have no symptoms. Ask your health care provider if a screening pelvic exam is right for you.  If you have had past treatment for cervical  cancer or a condition that could lead to cancer, you need Pap tests and screening for cancer for at least 20 years after your treatment. If Pap tests have been discontinued for you, your risk factors (such as having a new sexual partner) need to be reassessed to determine if you should start having screenings again. Some women have medical problems that increase the chance of getting cervical cancer. In these cases, your health care provider may recommend that you have screening and Pap tests more often.  If you have a family history of uterine cancer or ovarian cancer, talk with your health care provider about genetic screening.  If you have vaginal bleeding after reaching menopause, tell your health care provider.  There are currently no reliable tests available to screen for ovarian cancer.  Lung Cancer Lung cancer screening is recommended for adults 66-57 years old who are at high risk for lung cancer because of a history of smoking. A yearly low-dose CT scan of the lungs is recommended if you:  Currently smoke.  Have a history of at least 30 pack-years of smoking and you currently smoke or have quit within the past 15 years. A pack-year is smoking an average of one pack of cigarettes per day for one year.  Yearly screening  should:  Continue until it has been 15 years since you quit.  Stop if you develop a health problem that would prevent you from having lung cancer treatment.  Colorectal Cancer  This type of cancer can be detected and can often be prevented.  Routine colorectal cancer screening usually begins at age 80 and continues through age 73.  If you have risk factors for colon cancer, your health care provider may recommend that you be screened at an earlier age.  If you have a family history of colorectal cancer, talk with your health care provider about genetic screening.  Your health care provider may also recommend using home test kits to check for hidden blood in your  stool.  A small camera at the end of a tube can be used to examine your colon directly (sigmoidoscopy or colonoscopy). This is done to check for the earliest forms of colorectal cancer.  Direct examination of the colon should be repeated every 5-10 years until age 74. However, if early forms of precancerous polyps or small growths are found or if you have a family history or genetic risk for colorectal cancer, you may need to be screened more often.  Skin Cancer  Check your skin from head to toe regularly.  Monitor any moles. Be sure to tell your health care provider: ? About any new moles or changes in moles, especially if there is a change in a mole's shape or color. ? If you have a mole that is larger than the size of a pencil eraser.  If any of your family members has a history of skin cancer, especially at a young age, talk with your health care provider about genetic screening.  Always use sunscreen. Apply sunscreen liberally and repeatedly throughout the day.  Whenever you are outside, protect yourself by wearing long sleeves, pants, a wide-brimmed hat, and sunglasses.  What should I know about osteoporosis? Osteoporosis is a condition in which bone destruction happens more quickly than new bone creation. After menopause, you may be at an increased risk for osteoporosis. To help prevent osteoporosis or the bone fractures that can happen because of osteoporosis, the following is recommended:  If you are 5-45 years old, get at least 1,000 mg of calcium and at least 600 mg of vitamin D per day.  If you are older than age 46 but younger than age 68, get at least 1,200 mg of calcium and at least 600 mg of vitamin D per day.  If you are older than age 71, get at least 1,200 mg of calcium and at least 800 mg of vitamin D per day.  Smoking and excessive alcohol intake increase the risk of osteoporosis. Eat foods that are rich in calcium and vitamin D, and do weight-bearing exercises  several times each week as directed by your health care provider. What should I know about how menopause affects my mental health? Depression may occur at any age, but it is more common as you become older. Common symptoms of depression include:  Low or sad mood.  Changes in sleep patterns.  Changes in appetite or eating patterns.  Feeling an overall lack of motivation or enjoyment of activities that you previously enjoyed.  Frequent crying spells.  Talk with your health care provider if you think that you are experiencing depression. What should I know about immunizations? It is important that you get and maintain your immunizations. These include:  Tetanus, diphtheria, and pertussis (Tdap) booster vaccine.  Influenza every year  before the flu season begins.  Pneumonia vaccine.  Shingles vaccine.  Your health care provider may also recommend other immunizations. This information is not intended to replace advice given to you by your health care provider. Make sure you discuss any questions you have with your health care provider. Document Released: 10/18/2005 Document Revised: 03/15/2016 Document Reviewed: 05/30/2015 Elsevier Interactive Patient Education  2018 [employer].

## 2018-03-02 ENCOUNTER — Encounter: Payer: Self-pay | Admitting: Family Medicine

## 2018-03-02 ENCOUNTER — Ambulatory Visit (INDEPENDENT_AMBULATORY_CARE_PROVIDER_SITE_OTHER): Payer: Medicare HMO | Admitting: Family Medicine

## 2018-03-02 VITALS — BP 126/64 | HR 69 | Temp 97.9°F | Ht 62.0 in | Wt 174.2 lb

## 2018-03-02 DIAGNOSIS — R109 Unspecified abdominal pain: Secondary | ICD-10-CM

## 2018-03-02 DIAGNOSIS — N898 Other specified noninflammatory disorders of vagina: Secondary | ICD-10-CM | POA: Insufficient documentation

## 2018-03-02 DIAGNOSIS — L259 Unspecified contact dermatitis, unspecified cause: Secondary | ICD-10-CM | POA: Diagnosis not present

## 2018-03-02 DIAGNOSIS — S46212A Strain of muscle, fascia and tendon of other parts of biceps, left arm, initial encounter: Secondary | ICD-10-CM | POA: Diagnosis not present

## 2018-03-02 NOTE — Patient Instructions (Addendum)
It was very nice to see you today!  I think you have a biceps strain that is causing your abdominal pain.   Please listen to your body and avoid any exercises that make your pain worse.   You can try using triamcinolone on your spots of poison ivy.   We will get your records from your most recent mammogram.   Please come back on or after 12/17/2018 for your annual wellness visit.   Take care, Dr Jerline Pain

## 2018-03-02 NOTE — Progress Notes (Signed)
   Subjective:  Megan Pham is a 72 y.o. female who presents today with a chief complaint of left shoulder pain and to transfer care to this office.   HPI:  Shoulder Pain, New problem Started within the past few weeks. Located in left shoulder. Stable over the past several weeks. Patient is very active at [facility] and thinks she may have "overdid it." No specific treatments tried.  Pain is worse with overhead activity.  Also worse with pulling.  No other obvious alleviating or aggravating factors.  Rash, New Problem  Started a few days ago. Scattered on arms and legs. Tried OTC cream which has helped some.  Thinks it is due to poison ivy from working out in her yard.  She has a history of allergy to poison ivy.  No other treatments tried.  No other obvious alleviating or aggravating factors.  Abdominal Pain, New Problem Also started a few weeks ago.  Feels like a band sensation across her abdomen.  No nausea, vomiting, constipation, or diarrhea.  Thinks it is also related to being overactive at the [facility].  Symptoms have significantly improved over the past few days.  ROS: Per HPI  PMH: She reports that she quit smoking about 11 years ago. She has never used smokeless tobacco. She reports that she does not drink alcohol or use drugs.  Objective:  Physical Exam: BP 126/64 (BP Location: Left Arm, Patient Position: Sitting, Cuff Size: Normal)   Pulse 69   Temp 97.9 F (36.6 C) (Oral)   Ht 5\' 2"  (1.575 m)   Wt 174 lb 3.2 oz (79 kg)   SpO2 96%   BMI 31.86 kg/m   Gen: NAD, resting comfortably CV: RRR with no murmurs appreciated Pulm: NWOB, CTAB with no crackles, wheezes, or rhonchi GI: Bowel sounds present.  Soft, nontender, nondistended. MSK: -Left shoulder: No deformities.  Tender to palpation along left biceps tendon.  Rotator cuff strength intact without pain.  Speeds test positive.  Crossover test positive. Skin: Several discrete erythematous patches on arms and legs  bilaterally.  Assessment/Plan:  Left shoulder pain Likely secondary to biceps strain.  No red flag signs or symptoms.  No signs of rotator cuff tear or biceps tendon tear.  Patient declined medication at this point.  Continue with conservative management including relative rest and ice as needed.  Discussed reasons to return to care.  Follow-up as needed.  Abdominal pain No red flag signs or symptoms.  Likely secondary to abdominal muscle strain.  Her exam today is benign.  Patient declined medication at this point.  Continue with conservative management.  Discussed reasons to return to care.  Follow-up as needed.  Rash Contact dermatitis.  Instructed patient to use triamcinolone as needed to these areas.  Discussed reasons to return to care.  Follow-up as needed.  Preventative healthcare Patient will return for CPE with AWV.  Marland Kitchen Algis Greenhouse. Jerline Pain, MD 03/02/2018 1:54 PM

## 2018-03-03 ENCOUNTER — Telehealth: Payer: Self-pay | Admitting: Family Medicine

## 2018-03-03 NOTE — Telephone Encounter (Signed)
Noted.  Will send records request.

## 2018-03-03 NOTE — Telephone Encounter (Signed)
Copied from [place] [id number]. Topic: Quick Communication - See Telephone Encounter >> Mar 03, 2018 10:13 AM Bea Graff, NT wrote: CRM for notification. See Telephone encounter for: 03/03/18. Pt calling and stated that Dr. Jerline Pain wanted to know where her mammogram was done at. She states it was 11/24/17 at [facility] [place] on [address] in [place].

## 2018-03-03 NOTE — Telephone Encounter (Signed)
See note

## 2018-06-02 ENCOUNTER — Encounter: Payer: Self-pay | Admitting: Family Medicine

## 2018-06-02 ENCOUNTER — Ambulatory Visit (INDEPENDENT_AMBULATORY_CARE_PROVIDER_SITE_OTHER): Payer: Medicare HMO | Admitting: *Deleted

## 2018-06-02 DIAGNOSIS — Z23 Encounter for immunization: Secondary | ICD-10-CM

## 2018-06-26 DIAGNOSIS — H01009 Unspecified blepharitis unspecified eye, unspecified eyelid: Secondary | ICD-10-CM | POA: Diagnosis not present

## 2018-06-26 DIAGNOSIS — H02823 Cysts of right eye, unspecified eyelid: Secondary | ICD-10-CM | POA: Diagnosis not present

## 2018-07-14 ENCOUNTER — Ambulatory Visit (INDEPENDENT_AMBULATORY_CARE_PROVIDER_SITE_OTHER): Payer: Medicare HMO | Admitting: Family Medicine

## 2018-07-14 ENCOUNTER — Encounter: Payer: Self-pay | Admitting: Family Medicine

## 2018-07-14 VITALS — BP 122/62 | HR 78 | Temp 98.5°F | Ht 62.0 in | Wt 173.6 lb

## 2018-07-14 DIAGNOSIS — R059 Cough, unspecified: Secondary | ICD-10-CM

## 2018-07-14 DIAGNOSIS — R05 Cough: Secondary | ICD-10-CM

## 2018-07-14 NOTE — Progress Notes (Signed)
   Subjective:  Megan Pham is a 72 y.o. female who presents today for same-day appointment with a chief complaint of nasal congestion.   HPI:  Nasal Congestion, Acute problem Started 2 weeks ago.  Has improved over the last few days.  She has tried taking aspirin, DayQuil, NyQuil with some improvement in her symptoms.  She has had lingering cough.  No chest pain or shortness of breath.  ROS: Per HPI  Objective:  Physical Exam: BP 122/62 (BP Location: Left Arm, Patient Position: Sitting, Cuff Size: Normal)   Pulse 78   Temp 98.5 F (36.9 C) (Oral)   Ht 5\' 2"  (1.575 m)   Wt 173 lb 9.6 oz (78.7 kg)   SpO2 95%   BMI 31.75 kg/m   Gen: NAD, resting comfortably HEENT: TMs with clear effusion.  Oropharynx clear. CV: RRR with no murmurs appreciated Pulm: NWOB, CTAB with no crackles, wheezes, or rhonchi  Assessment/Plan:  Cough Postinfectious cough.  No signs of infection today.  Recommended continued over-the-counter medications, good oral hydration, and rest.  Anticipate continued improvement over the next few weeks.  Discussed reasons to return to care.  Follow-up as needed.  Algis Greenhouse. Jerline Pain, MD 07/14/2018 2:05 PM

## 2018-07-14 NOTE — Patient Instructions (Signed)
It was very nice to see you today!  Your lungs are clear.  You do not have any signs of bronchitis or pneumonia.  You can continue using over-the-counter meds as needed.  Please make sure you stay well-hydrated and get plenty of rest.  It is normal for your cough to last for several weeks.  Please let me know if your symptoms worsen or do not improve the next several weeks.  Take care, Dr Jerline Pain

## 2018-07-23 DIAGNOSIS — H02823 Cysts of right eye, unspecified eyelid: Secondary | ICD-10-CM | POA: Diagnosis not present

## 2018-07-23 DIAGNOSIS — H01009 Unspecified blepharitis unspecified eye, unspecified eyelid: Secondary | ICD-10-CM | POA: Diagnosis not present

## 2018-11-24 DIAGNOSIS — M8589 Other specified disorders of bone density and structure, multiple sites: Secondary | ICD-10-CM | POA: Diagnosis not present

## 2018-11-24 DIAGNOSIS — Z90710 Acquired absence of both cervix and uterus: Secondary | ICD-10-CM | POA: Diagnosis not present

## 2018-11-24 DIAGNOSIS — Z1231 Encounter for screening mammogram for malignant neoplasm of breast: Secondary | ICD-10-CM | POA: Diagnosis not present

## 2018-11-24 DIAGNOSIS — Z803 Family history of malignant neoplasm of breast: Secondary | ICD-10-CM | POA: Diagnosis not present

## 2018-11-24 DIAGNOSIS — Z8262 Family history of osteoporosis: Secondary | ICD-10-CM | POA: Diagnosis not present

## 2018-11-24 LAB — HM DEXA SCAN

## 2018-11-24 LAB — HM MAMMOGRAPHY

## 2018-12-01 ENCOUNTER — Encounter: Payer: Self-pay | Admitting: Family Medicine

## 2019-03-19 DIAGNOSIS — H1132 Conjunctival hemorrhage, left eye: Secondary | ICD-10-CM | POA: Diagnosis not present

## 2019-04-30 ENCOUNTER — Telehealth: Payer: Self-pay | Admitting: Family Medicine

## 2019-04-30 NOTE — Telephone Encounter (Signed)
Called patient to schedule AWV, but no answer; could not leave voicemail. Will try to call patient back at later time. SF

## 2019-05-24 ENCOUNTER — Telehealth: Payer: Self-pay

## 2019-05-24 NOTE — Telephone Encounter (Signed)
Copied from [place] [phone]. Topic: Quick Communication - Other Results (Clinic Use ONLY) >> May 24, 2019 12:43 PM Pauline Good wrote: Pt want to know results of her bone density she  had done 3.17.20

## 2019-05-24 NOTE — Telephone Encounter (Signed)
Please advise 

## 2019-05-24 NOTE — Telephone Encounter (Signed)
T score is -1.4 which is better than -1.6 she had a couple of years ago. She does not have osteoporosis. She should continue calcium and vitamin D and we can recheck in a couple of years.  Algis Greenhouse. Jerline Pain, MD 05/24/2019 4:00 PM

## 2019-05-25 NOTE — Telephone Encounter (Signed)
Notified patient of lab results.Patient voices understanding.  

## 2019-06-25 ENCOUNTER — Ambulatory Visit (INDEPENDENT_AMBULATORY_CARE_PROVIDER_SITE_OTHER): Payer: Medicare HMO

## 2019-06-25 ENCOUNTER — Encounter: Payer: Self-pay | Admitting: Family Medicine

## 2019-06-25 ENCOUNTER — Other Ambulatory Visit: Payer: Self-pay

## 2019-06-25 DIAGNOSIS — Z23 Encounter for immunization: Secondary | ICD-10-CM | POA: Diagnosis not present

## 2019-06-29 ENCOUNTER — Telehealth: Payer: Self-pay | Admitting: Family Medicine

## 2019-06-29 NOTE — Telephone Encounter (Signed)
I called the patient to schedule her AWV with Loma Sousa, but she declined.

## 2019-12-31 DIAGNOSIS — Z1231 Encounter for screening mammogram for malignant neoplasm of breast: Secondary | ICD-10-CM | POA: Diagnosis not present

## 2019-12-31 LAB — HM MAMMOGRAPHY

## 2020-01-13 ENCOUNTER — Encounter: Payer: Self-pay | Admitting: Family Medicine

## 2020-01-18 ENCOUNTER — Other Ambulatory Visit: Payer: Self-pay

## 2020-01-18 ENCOUNTER — Encounter: Payer: Self-pay | Admitting: Family Medicine

## 2020-01-18 ENCOUNTER — Ambulatory Visit (INDEPENDENT_AMBULATORY_CARE_PROVIDER_SITE_OTHER): Payer: Medicare HMO | Admitting: Family Medicine

## 2020-01-18 VITALS — BP 174/80 | HR 70 | Temp 98.0°F | Ht 62.0 in | Wt 176.4 lb

## 2020-01-18 DIAGNOSIS — R030 Elevated blood-pressure reading, without diagnosis of hypertension: Secondary | ICD-10-CM | POA: Diagnosis not present

## 2020-01-18 DIAGNOSIS — R21 Rash and other nonspecific skin eruption: Secondary | ICD-10-CM

## 2020-01-18 MED ORDER — TRIAMCINOLONE ACETONIDE 0.5 % EX OINT: 1.0000 "application " | TOPICAL_OINTMENT | Freq: Two times a day (BID) | CUTANEOUS | 0 refills | Status: DC

## 2020-01-18 NOTE — Patient Instructions (Addendum)
It was very nice to see you today!  Please start the triamcinolone.  Let me know if not improving by next week.  Take care, Dr Jerline Pain  Please try these tips to maintain a healthy lifestyle:   Eat at least 3 REAL meals and 1-2 snacks per day.  Aim for no more than 5 hours between eating.  If you eat breakfast, please do so within one hour of getting up.    Each meal should contain half fruits/vegetables, one quarter protein, and one quarter carbs (no bigger than a computer mouse)   Cut down on sweet beverages. This includes juice, soda, and sweet tea.     Drink at least 1 glass of water with each meal and aim for at least 8 glasses per day   Exercise at least 150 minutes every week.

## 2020-01-18 NOTE — Progress Notes (Signed)
   Megan Pham is a 74 y.o. female who presents today for an office visit.  Assessment/Plan:  New/Acute Problems: Rash Consistent with contact dermatitis.  Doubt shingles given distribution.  Start topical triamcinolone as this is worked well for her in the past.  Discussed reasons to return to care.  Elevated blood pressure Elevated today in setting of acute illness.  Has previously been well controlled.  No red flags.  Continue home monitoring goal 150/90 or lower.  We will    Subjective:  HPI:  Patient here with rash on right lower back and right leg for the past 4 to 5 days. She has been doing a lot of yard work and noticed that she was exposed to poison oak.  She has tried over-the-counter calamine lotion with no improvement.  Has a lot of itching and burning.        Objective:  Physical Exam: BP (!) 174/80   Pulse 70   Temp 98 F (36.7 C)   Ht 5\' 2"  (1.575 m)   Wt 176 lb 6.1 oz (80 kg)   SpO2 97%   BMI 32.26 kg/m   Gen: No acute distress, resting comfortably Skin: Erythematous excoriated rash on right lower back and right thigh.     Algis Greenhouse. Jerline Pain, MD 01/18/2020 11:48 AM

## 2020-01-24 ENCOUNTER — Ambulatory Visit (INDEPENDENT_AMBULATORY_CARE_PROVIDER_SITE_OTHER): Payer: Medicare HMO | Admitting: Family Medicine

## 2020-01-24 ENCOUNTER — Other Ambulatory Visit: Payer: Self-pay

## 2020-01-24 ENCOUNTER — Encounter: Payer: Self-pay | Admitting: Family Medicine

## 2020-01-24 VITALS — BP 160/80 | HR 98 | Temp 97.7°F | Ht 62.0 in | Wt 174.6 lb

## 2020-01-24 DIAGNOSIS — R21 Rash and other nonspecific skin eruption: Secondary | ICD-10-CM

## 2020-01-24 MED ORDER — TRIAMCINOLONE ACETONIDE 0.1 % EX CREA: 1.0000 "application " | TOPICAL_CREAM | Freq: Two times a day (BID) | CUTANEOUS | 0 refills | Status: DC

## 2020-01-24 NOTE — Progress Notes (Signed)
   Megan Pham is a 74 y.o. female who presents today for an office visit.  Assessment/Plan:  Rash Discussed with patient that I would be very surprised if 0.1% triamcinolone cream works better than the 0.5% triamcinolone ointment.  However she wished to try the 0.1% cream.  We will send a prescription to her pharmacy.  Offered injection of Depo-Medrol or course of prednisone however patient declined.  She will check in with me in a week or 2 if not improving.     Subjective:  HPI:  Patient here for rash follow-up.  Last seen about a week ago.  Started on triamcinolone ointment 0.5%.  States this did not help with the rash at all.  She request to be prescribed triamcinolone cream 0.1% as was prescribed at her previous PCP.  She states this worked much better for her.       Objective:  Physical Exam: BP (!) 160/80 (BP Location: Left Arm, Patient Position: Sitting, Cuff Size: Normal)   Pulse 98   Temp 97.7 F (36.5 C) (Temporal)   Ht 5\' 2"  (1.575 m)   Wt 174 lb 9.6 oz (79.2 kg)   SpO2 97%   BMI 31.93 kg/m   Gen: No acute distress, resting comfortably Skin: Erythematous excoriated rash on right lateral thigh.     Megan Pham. Megan Pain, MD 01/24/2020 9:52 AM

## 2020-01-31 ENCOUNTER — Telehealth: Payer: Self-pay | Admitting: Family Medicine

## 2020-01-31 NOTE — Telephone Encounter (Signed)
Please offer a depomedrol injection or a course of prednisone.  Algis Greenhouse. Jerline Pain, MD 01/31/2020 11:03 AM

## 2020-01-31 NOTE — Telephone Encounter (Signed)
Please advise 

## 2020-01-31 NOTE — Telephone Encounter (Signed)
Pt refused both tx, stated will try OTC medication  Will call if no changes

## 2020-01-31 NOTE — Telephone Encounter (Signed)
Patient had an OV on 5/17. She states her poison ivy is not any better. She would like to know if something else can be called and she would like a nurse to follow up with her.   Send RX to [facility] on file.

## 2020-03-14 ENCOUNTER — Telehealth (INDEPENDENT_AMBULATORY_CARE_PROVIDER_SITE_OTHER): Payer: Medicare HMO | Admitting: Physician Assistant

## 2020-03-14 ENCOUNTER — Telehealth: Payer: Self-pay | Admitting: Physician Assistant

## 2020-03-14 ENCOUNTER — Other Ambulatory Visit: Payer: Self-pay

## 2020-03-14 ENCOUNTER — Encounter: Payer: Self-pay | Admitting: Physician Assistant

## 2020-03-14 DIAGNOSIS — R399 Unspecified symptoms and signs involving the genitourinary system: Secondary | ICD-10-CM | POA: Diagnosis not present

## 2020-03-14 DIAGNOSIS — R1084 Generalized abdominal pain: Secondary | ICD-10-CM

## 2020-03-14 LAB — POCT URINALYSIS DIPSTICK
Bilirubin, UA: NEGATIVE
Blood, UA: NEGATIVE
Glucose, UA: NEGATIVE
Ketones, UA: NEGATIVE
Leukocytes, UA: NEGATIVE
Nitrite, UA: NEGATIVE
Protein, UA: NEGATIVE
Spec Grav, UA: 1.01 (ref 1.010–1.025)
Urobilinogen, UA: 0.2 E.U./dL
pH, UA: 6 (ref 5.0–8.0)

## 2020-03-14 NOTE — Progress Notes (Signed)
TELEPHONE ENCOUNTER   Patient verbally agreed to telephone visit and is aware that copayment and coinsurance may apply. Patient was treated using telemedicine according to accepted telemedicine protocols.  Location of the patient: home Location of provider: [place] Names of all persons participating in the telemedicine service and role in the encounter: Inda Coke, [place] , Enzo Bi  Subjective:   Chief Complaint  Patient presents with  . Urinary symptoms  . Urinary Frequency  . Dysuria     HPI   Dysuria Pt c/o dysuria, frequency and some lower abdominal pressure x 1 week. Pt has not taken any medications.  She states repeatedly that she "just doesn't feel good." States that when she presses on her abdomen she has pain. States that the pain goes from under her breasts and down. States that her "abdomen feels heavy."  Had a bowel movement this morning that was normal for her. Denies concerns for constipation. Takes an " OTC stool softener with a stimulant daily" for this.    Appetite is fine. She is trying to push fluids.   Denies: rectal bleeding, fever, chills  Patient Active Problem List   Diagnosis Date Noted  . Vaginal irritation 03/02/2018  . Osteopenia 11/28/2016  . Osteoarthritis 10/11/2010   Social History   Tobacco Use  . Smoking status: Former Smoker    Quit date: 09/09/2006    Years since quitting: 13.5  . Smokeless tobacco: Never Used  Substance Use Topics  . Alcohol use: No    Current Outpatient Medications:  .  ibuprofen (ADVIL,MOTRIN) 200 MG tablet, Take 400 mg by mouth 2 (two) times daily., Disp: , Rfl:  .  Multiple Vitamins-Minerals (CENTRUM SILVER ADULT 50+) TABS, Take 1 tablet by mouth daily., Disp: , Rfl:  Allergies  Allergen Reactions  . Cefoxitin Sodium In Dextrose Hives  . Mefoxin [Cefoxitin] Hives    "Myfoxin" during surgery per pt.    Results for orders placed or performed in visit on 03/14/20  POCT Urinalysis  Dipstick  Result Value Ref Range   Color, UA yellow    Clarity, UA clear    Glucose, UA Negative Negative   Bilirubin, UA negative    Ketones, UA negative    Spec Grav, UA 1.010 1.010 - 1.025   Blood, UA negative    pH, UA 6.0 5.0 - 8.0   Protein, UA Negative Negative   Urobilinogen, UA 0.2 0.2 or 1.0 E.U./dL   Nitrite, UA negative    Leukocytes, UA Negative Negative   Appearance     Odor      Assessment & Plan:   1. UTI symptoms   2. Generalized abdominal pain    UA normal.  After discussion of her symptoms, including generalized abdominal pain and "I just don't feel good", I asked patient if she would come to the office for me to do an in-office examination on her. I told her that I did not have any openings but I would work her in. Patient refused stating "I do not want to see anyone in your office except for my doctor, Dr. Dimas Chyle. I do not know why your office has a sign that says 'Accepting New Patients' if I can never get an appointment with my doctor."  I explained to patient that I understood her concerns and explained that today, the day after a holiday, can be very busy. I again offered for her to come see me this afternoon but she refused.  Patient  declined any medication for her symptoms since her urine was negative. I did offer to start an antibiotic for possible UTI until her culture returns but she declined. I discussed with her that I would be in touch with her culture results regardless of the outcome. She verbalized understanding.  I advised patient to go to the ER if symptoms worsen.  Patient asked me to get her scheduled with Dr. Jerline Pain for an in-office visit. Will forward this message to his CMA, [facility] for further action.   Orders Placed This Encounter  Procedures  . Urine Culture  . POCT Urinalysis Dipstick   No orders of the defined types were placed in this encounter.   Inda Coke, [place] 03/14/2020  Time spent with the patient: 12  minutes, spent in obtaining information about her symptoms, reviewing her previous labs, evaluations, and treatments, counseling her about her condition (please see the discussed topics above), and developing a plan to further investigate it; she had a number of questions which I addressed.

## 2020-03-14 NOTE — Telephone Encounter (Signed)
I completed virtual visit with patient today regarding UTI symptoms.  After discussion of her symptoms, including generalized abdominal pain and "I just don't feel good", I asked patient if she would come to the office for me to do an in-office examination on her. I told her that I did not have any openings but I would work her in. Patient refused stating "I do not want to see anyone in your office except for my doctor, Dr. Dimas Chyle. I do not know why your office has a sign that says 'Accepting New Patients' if I can never get an appointment with my doctor."  I explained to patient that I understood her concerns and explained that today, the day after a holiday, can be very busy. I again offered for her to come see me this afternoon but she refused.  Patient declined any medication for her symptoms since her urine was negative. I did offer to start an antibiotic for possible UTI until her culture returns but she declined. I discussed with her that I would be in touch with her culture results regardless of the outcome. She verbalized understanding.  I advised patient to go to the ER if symptoms worsen.  Patient asked me to get her scheduled with Dr. Jerline Pain for an in-office visit. Will forward this message to his CMA, [facility] for further action.  Inda Coke

## 2020-03-14 NOTE — Telephone Encounter (Signed)
Spoke with patient offered appt for Thursday July 8th patient declined. Informed patient if symptoms worsen please give [place] a call

## 2020-03-15 LAB — URINE CULTURE
MICRO NUMBER:: 10670300
Result:: NO GROWTH
SPECIMEN QUALITY:: ADEQUATE

## 2020-03-20 ENCOUNTER — Other Ambulatory Visit: Payer: Self-pay

## 2020-03-22 ENCOUNTER — Other Ambulatory Visit: Payer: Self-pay

## 2020-03-22 ENCOUNTER — Encounter: Payer: Self-pay | Admitting: Family Medicine

## 2020-03-22 ENCOUNTER — Ambulatory Visit (INDEPENDENT_AMBULATORY_CARE_PROVIDER_SITE_OTHER): Payer: Medicare HMO | Admitting: Family Medicine

## 2020-03-22 VITALS — BP 163/81 | HR 71 | Temp 97.2°F | Ht 62.0 in | Wt 173.2 lb

## 2020-03-22 DIAGNOSIS — R1013 Epigastric pain: Secondary | ICD-10-CM

## 2020-03-22 LAB — COMPREHENSIVE METABOLIC PANEL
ALT: 27 U/L (ref 0–35)
AST: 24 U/L (ref 0–37)
Albumin: 4.6 g/dL (ref 3.5–5.2)
Alkaline Phosphatase: 101 U/L (ref 39–117)
BUN: 12 mg/dL (ref 6–23)
CO2: 27 mEq/L (ref 19–32)
Calcium: 9.7 mg/dL (ref 8.4–10.5)
Chloride: 102 mEq/L (ref 96–112)
Creatinine, Ser: 0.67 mg/dL (ref 0.40–1.20)
GFR: 85.95 mL/min (ref >60.00)
Glucose, Bld: 107 mg/dL — ABNORMAL HIGH (ref 70–99)
Potassium: 4.5 mEq/L (ref 3.5–5.1)
Sodium: 139 mEq/L (ref 135–145)
Total Bilirubin: 0.5 mg/dL (ref 0.2–1.2)
Total Protein: 7.5 g/dL (ref 6.0–8.3)

## 2020-03-22 LAB — H. PYLORI ANTIBODY, IGG: H Pylori IgG: NEGATIVE

## 2020-03-22 LAB — LIPASE: Lipase: 20 U/L (ref 11.0–59.0)

## 2020-03-22 LAB — CBC
HCT: 40.6 % (ref 36.0–46.0)
Hemoglobin: 13.5 g/dL (ref 12.0–15.0)
MCHC: 33.3 g/dL (ref 30.0–36.0)
MCV: 90.1 fl (ref 78.0–100.0)
Platelets: 308 10*3/uL (ref 150.0–400.0)
RBC: 4.51 Mil/uL (ref 3.87–5.11)
RDW: 12.6 % (ref 11.5–15.5)
WBC: 6.6 10*3/uL (ref 4.0–10.5)

## 2020-03-22 LAB — TSH: TSH: 2.31 u[IU]/mL (ref 0.35–4.50)

## 2020-03-22 MED ORDER — PANTOPRAZOLE SODIUM 40 MG PO TBEC: 40.0000 mg | DELAYED_RELEASE_TABLET | Freq: Every day | ORAL | 5 refills | Status: DC

## 2020-03-22 NOTE — Progress Notes (Signed)
   Megan Pham is a 74 y.o. female who presents today for an office visit.  Assessment/Plan:  New/Acute Problems: Epigastric abdominal pain Mild tenderness on exam. No peritoneal signs.  Concern for peptic ulcer disease.  Will check labs including CBC, CMET, TSH, lipase And H. pylori antibody.  Has already had gallbladder removed.  Doubt AAA given that symptoms have improved with Tums and Pepto. Start Protonix 40 mg daily.  If no improvement would consider abdominal CT versus referral to GI.  Discussed reasons to return to care.     Subjective:  HPI:  Started having abdominal pain about 3 weeks ago. Located in middle of her abdomen. Has more bloating and gas. Has had strawberries and blueberries. Some diarrhea. No difference after eating. No fevers or chills. Tried TUMS and pepto with modest improvement.  Has a history of gastric ulcer.       Objective:  Physical Exam: BP (!) 163/81   Pulse 71   Temp (!) 97.2 F (36.2 C)   Ht 5\' 2"  (1.575 m)   Wt 173 lb 3.2 oz (78.6 kg)   SpO2 97%   BMI 31.68 kg/m   Gen: No acute distress, resting comfortably CV: Regular rate and rhythm with no murmurs appreciated Pulm: Normal work of breathing, clear to auscultation bilaterally with no crackles, wheezes, or rhonchi GI: Soft.  Nondistended.  Tender to palpation along epigastric area.  No rebound or guarding.  No pulsatile masses. Neuro: Grossly normal, moves all extremities Psych: Normal affect and thought content  Time Spent: 31 minutes of total time was spent on the date of the encounter performing the following actions: chart review prior to seeing the patient, obtaining history, performing a medically necessary exam including abdominal exam, counseling on the treatment plan, placing orders, and documenting in our EHR.        Algis Greenhouse. Jerline Pain, MD 03/22/2020 8:32 AM

## 2020-03-22 NOTE — Patient Instructions (Signed)
It was very nice to see you today!  We will check blood work today.  Please start the acid blocker medication.  Let us know if your symptoms worsen.    Take care, Dr Jerline Pain  Please try these tips to maintain a healthy lifestyle:   Eat at least 3 REAL meals and 1-2 snacks per day.  Aim for no more than 5 hours between eating.  If you eat breakfast, please do so within one hour of getting up.    Each meal should contain half fruits/vegetables, one quarter protein, and one quarter carbs (no bigger than a computer mouse)   Cut down on sweet beverages. This includes juice, soda, and sweet tea.     Drink at least 1 glass of water with each meal and aim for at least 8 glasses per day   Exercise at least 150 minutes every week.

## 2020-06-20 DIAGNOSIS — R69 Illness, unspecified: Secondary | ICD-10-CM | POA: Diagnosis not present

## 2021-01-02 DIAGNOSIS — Z803 Family history of malignant neoplasm of breast: Secondary | ICD-10-CM | POA: Diagnosis not present

## 2021-01-02 DIAGNOSIS — Z1231 Encounter for screening mammogram for malignant neoplasm of breast: Secondary | ICD-10-CM | POA: Diagnosis not present

## 2021-01-02 LAB — HM MAMMOGRAPHY

## 2021-01-04 ENCOUNTER — Encounter: Payer: Self-pay | Admitting: Family Medicine

## 2021-01-11 LAB — HM MAMMOGRAPHY

## 2021-01-15 ENCOUNTER — Encounter: Payer: Self-pay | Admitting: Family Medicine

## 2021-04-03 DIAGNOSIS — H903 Sensorineural hearing loss, bilateral: Secondary | ICD-10-CM | POA: Diagnosis not present

## 2021-04-24 ENCOUNTER — Encounter: Payer: Self-pay | Admitting: Gastroenterology

## 2021-06-19 ENCOUNTER — Other Ambulatory Visit: Payer: Self-pay

## 2021-06-19 ENCOUNTER — Ambulatory Visit (INDEPENDENT_AMBULATORY_CARE_PROVIDER_SITE_OTHER): Payer: Medicare HMO

## 2021-06-19 DIAGNOSIS — Z23 Encounter for immunization: Secondary | ICD-10-CM

## 2021-07-09 ENCOUNTER — Telehealth (INDEPENDENT_AMBULATORY_CARE_PROVIDER_SITE_OTHER): Payer: Medicare HMO | Admitting: Family Medicine

## 2021-07-09 VITALS — Ht 62.0 in

## 2021-07-09 DIAGNOSIS — J111 Influenza due to unidentified influenza virus with other respiratory manifestations: Secondary | ICD-10-CM | POA: Diagnosis not present

## 2021-07-09 MED ORDER — PROMETHAZINE-DM 6.25-15 MG/5ML PO SYRP
5.0000 mL | ORAL_SOLUTION | Freq: Four times a day (QID) | ORAL | 0 refills | Status: DC | PRN
Start: 2021-07-09 — End: 2021-10-19

## 2021-07-09 MED ORDER — OSELTAMIVIR PHOSPHATE 75 MG PO CAPS: 75.0000 mg | ORAL_CAPSULE | Freq: Two times a day (BID) | ORAL | 0 refills | Status: DC

## 2021-07-09 NOTE — Progress Notes (Signed)
   Megan Pham is a 75 y.o. female who presents today for a virtual office visit.  Assessment/Plan:  New/Acute Problems: Influenza No red flags. Start Tamiflu.  Start promethazine-dextromethorphan cough syrup.  Encourage good oral hydration.  She can use over-the-counter ibuprofen and Tylenol as needed for myalgias and fever.  Discussed reasons to return to care.  Follow-up as needed.     Subjective:  HPI:  Symptoms started 4 days ago. She tested positive for flu. Symptoms include chills, body aches, cough, and congestion.  Symptoms are worsening. Cough is keeping her up at night. No known sick contacts.        Objective/Observations  Physical Exam: Gen: NAD, resting comfortably Pulm: Normal work of breathing Neuro: Grossly normal, moves all extremities Psych: Normal affect and thought content  Virtual Visit via Video   I connected with Megan Pham on 07/09/21 at  3:40 PM EDT by a video enabled telemedicine application and verified that I am speaking with the correct person using two identifiers. The limitations of evaluation and management by telemedicine and the availability of in person appointments were discussed. The patient expressed understanding and agreed to proceed.   Patient location: Home Provider location: [place] participating in the virtual visit: Myself and Patient     Algis Greenhouse. Jerline Pain, MD 07/09/2021 11:55 AM

## 2021-07-10 ENCOUNTER — Telehealth: Payer: Self-pay

## 2021-07-10 NOTE — Telephone Encounter (Signed)
Pt called to check the status of her prescription. Please Advise.

## 2021-07-10 NOTE — Telephone Encounter (Signed)
Patient is calling in stating that she has called [facility], they state they dont have the ingredients so she is asking for the medication to be transferred to [facility] in [place].

## 2021-07-12 NOTE — Telephone Encounter (Signed)
Can we check with the patient? We can send to an alternative pharmacy if needed.  Algis Greenhouse. Jerline Pain, MD 07/12/2021 7:58 AM

## 2021-07-12 NOTE — Telephone Encounter (Signed)
Call patient, unable to get in contact with patient  Voice mail not set up yet

## 2021-07-13 ENCOUNTER — Telehealth: Payer: Self-pay | Admitting: *Deleted

## 2021-07-13 NOTE — Telephone Encounter (Signed)
Patient called stated have not pick up Rx prescribed on 07/09/2020 due to pharmacy was out of medication compound    Called pharmacy for verification pharmacist stated Rx was not pickup and been ready.  Called patient back to let her know Rx been ready, patient stated no need for it now and will not pick up Rx

## 2021-08-07 DIAGNOSIS — H35363 Drusen (degenerative) of macula, bilateral: Secondary | ICD-10-CM | POA: Diagnosis not present

## 2021-08-07 DIAGNOSIS — H524 Presbyopia: Secondary | ICD-10-CM | POA: Diagnosis not present

## 2021-09-06 ENCOUNTER — Telehealth: Payer: Self-pay | Admitting: Family Medicine

## 2021-09-06 NOTE — Telephone Encounter (Signed)
Spoke with patient she request a CB in January 2023

## 2021-09-13 ENCOUNTER — Telehealth: Payer: Self-pay | Admitting: Family Medicine

## 2021-09-13 NOTE — Telephone Encounter (Signed)
Copied from [place] [id number]. Topic: Medicare AWV >> Sep 13, 2021  2:06 PM Harris-Coley, Hannah Beat wrote: Reason for CRM: Attempted to schedule AWV. Unable to LVM.  Will try at later time. NO voicemail setup

## 2021-10-19 ENCOUNTER — Other Ambulatory Visit: Payer: Self-pay

## 2021-10-19 ENCOUNTER — Ambulatory Visit (INDEPENDENT_AMBULATORY_CARE_PROVIDER_SITE_OTHER): Payer: Medicare Other

## 2021-10-19 DIAGNOSIS — Z0000 Encounter for general adult medical examination without abnormal findings: Secondary | ICD-10-CM

## 2021-10-19 NOTE — Patient Instructions (Addendum)
[name] , Thank you for taking time to come for your Medicare Wellness Visit. I appreciate your ongoing commitment to your health goals. Please review the following plan we discussed and let me know if I can assist you in the future.   Screening recommendations/referrals: Colonoscopy: Done 12/04/15 repeat every 5 years, Due to schedule appt  Mammogram: Done 01/11/21 repeat every year Bone Density: Done 11/24/18 repeat every 2 years  Recommended yearly ophthalmology/optometry visit for glaucoma screening and checkup Recommended yearly dental visit for hygiene and checkup  Vaccinations: Influenza vaccine: Done 06/19/21 repeat every year  Pneumococcal vaccine: Up to date Tdap vaccine: Done 11/13/16 repeat every 10 years  Shingles vaccine: Shingrix discussed. Please contact your pharmacy for coverage information.    Covid-19:Completed 2/12, 3/12, & 06/07/21  Advanced directives: Advance directive discussed with you today. Even though you declined this today please call our office should you change your mind and we can give you the proper paperwork for you to fill out.  Conditions/risks identified: Lose weight   Next appointment: Follow up in one year for your annual wellness visit    Preventive Care 65 Years and Older, Female Preventive care refers to lifestyle choices and visits with your health care provider that can promote health and wellness. What does preventive care include? A yearly physical exam. This is also called an annual well check. Dental exams once or twice a year. Routine eye exams. Ask your health care provider how often you should have your eyes checked. Personal lifestyle choices, including: Daily care of your teeth and gums. Regular physical activity. Eating a healthy diet. Avoiding tobacco and drug use. Limiting alcohol use. Practicing safe sex. Taking low-dose aspirin every day. Taking vitamin and mineral supplements as recommended by your health care  provider. What happens during an annual well check? The services and screenings done by your health care provider during your annual well check will depend on your age, overall health, lifestyle risk factors, and family history of disease. Counseling  Your health care provider may ask you questions about your: Alcohol use. Tobacco use. Drug use. Emotional well-being. Home and relationship well-being. Sexual activity. Eating habits. History of falls. Memory and ability to understand (cognition). Work and work Statistician. Reproductive health. Screening  You may have the following tests or measurements: Height, weight, and BMI. Blood pressure. Lipid and cholesterol levels. These may be checked every 5 years, or more frequently if you are over 25 years old. Skin check. Lung cancer screening. You may have this screening every year starting at age 2 if you have a 30-pack-year history of smoking and currently smoke or have quit within the past 15 years. Fecal occult blood test (FOBT) of the stool. You may have this test every year starting at age 23. Flexible sigmoidoscopy or colonoscopy. You may have a sigmoidoscopy every 5 years or a colonoscopy every 10 years starting at age 58. Hepatitis C blood test. Hepatitis B blood test. Sexually transmitted disease (STD) testing. Diabetes screening. This is done by checking your blood sugar (glucose) after you have not eaten for a while (fasting). You may have this done every 1-3 years. Bone density scan. This is done to screen for osteoporosis. You may have this done starting at age 79. Mammogram. This may be done every 1-2 years. Talk to your health care provider about how often you should have regular mammograms. Talk with your health care provider about your test results, treatment options, and if necessary, the need for more  tests. Vaccines  Your health care provider may recommend certain vaccines, such as: Influenza vaccine. This is  recommended every year. Tetanus, diphtheria, and acellular pertussis (Tdap, Td) vaccine. You may need a Td booster every 10 years. Zoster vaccine. You may need this after age 43. Pneumococcal 13-valent conjugate (PCV13) vaccine. One dose is recommended after age 2. Pneumococcal polysaccharide (PPSV23) vaccine. One dose is recommended after age 71. Talk to your health care provider about which screenings and vaccines you need and how often you need them. This information is not intended to replace advice given to you by your health care provider. Make sure you discuss any questions you have with your health care provider. Document Released: 09/22/2015 Document Revised: 05/15/2016 Document Reviewed: 06/27/2015 Elsevier Interactive Patient Education  2017 [employer] Prevention in the Home Falls can cause injuries. They can happen to people of all ages. There are many things you can do to make your home safe and to help prevent falls. What can I do on the outside of my home? Regularly fix the edges of walkways and driveways and fix any cracks. Remove anything that might make you trip as you walk through a door, such as a raised step or threshold. Trim any bushes or trees on the path to your home. Use bright outdoor lighting. Clear any walking paths of anything that might make someone trip, such as rocks or tools. Regularly check to see if handrails are loose or broken. Make sure that both sides of any steps have handrails. Any raised decks and porches should have guardrails on the edges. Have any leaves, snow, or ice cleared regularly. Use sand or salt on walking paths during winter. Clean up any spills in your garage right away. This includes oil or grease spills. What can I do in the bathroom? Use night lights. Install grab bars by the toilet and in the tub and shower. Do not use towel bars as grab bars. Use non-skid mats or decals in the tub or shower. If you need to sit down in  the shower, use a plastic, non-slip stool. Keep the floor dry. Clean up any water that spills on the floor as soon as it happens. Remove soap buildup in the tub or shower regularly. Attach bath mats securely with double-sided non-slip rug tape. Do not have throw rugs and other things on the floor that can make you trip. What can I do in the bedroom? Use night lights. Make sure that you have a light by your bed that is easy to reach. Do not use any sheets or blankets that are too big for your bed. They should not hang down onto the floor. Have a firm chair that has side arms. You can use this for support while you get dressed. Do not have throw rugs and other things on the floor that can make you trip. What can I do in the kitchen? Clean up any spills right away. Avoid walking on wet floors. Keep items that you use a lot in easy-to-reach places. If you need to reach something above you, use a strong step stool that has a grab bar. Keep electrical cords out of the way. Do not use floor polish or wax that makes floors slippery. If you must use wax, use non-skid floor wax. Do not have throw rugs and other things on the floor that can make you trip. What can I do with my stairs? Do not leave any items on the stairs. Make sure  that there are handrails on both sides of the stairs and use them. Fix handrails that are broken or loose. Make sure that handrails are as long as the stairways. Check any carpeting to make sure that it is firmly attached to the stairs. Fix any carpet that is loose or worn. Avoid having throw rugs at the top or bottom of the stairs. If you do have throw rugs, attach them to the floor with carpet tape. Make sure that you have a light switch at the top of the stairs and the bottom of the stairs. If you do not have them, ask someone to add them for you. What else can I do to help prevent falls? Wear shoes that: Do not have high heels. Have rubber bottoms. Are comfortable  and fit you well. Are closed at the toe. Do not wear sandals. If you use a stepladder: Make sure that it is fully opened. Do not climb a closed stepladder. Make sure that both sides of the stepladder are locked into place. Ask someone to hold it for you, if possible. Clearly mark and make sure that you can see: Any grab bars or handrails. First and last steps. Where the edge of each step is. Use tools that help you move around (mobility aids) if they are needed. These include: Canes. Walkers. Scooters. Crutches. Turn on the lights when you go into a dark area. Replace any light bulbs as soon as they burn out. Set up your furniture so you have a clear path. Avoid moving your furniture around. If any of your floors are uneven, fix them. If there are any pets around you, be aware of where they are. Review your medicines with your doctor. Some medicines can make you feel dizzy. This can increase your chance of falling. Ask your doctor what other things that you can do to help prevent falls. This information is not intended to replace advice given to you by your health care provider. Make sure you discuss any questions you have with your health care provider. Document Released: 06/22/2009 Document Revised: 02/01/2016 Document Reviewed: 09/30/2014 Elsevier Interactive Patient Education  2017 [employer].

## 2021-10-19 NOTE — Progress Notes (Signed)
Virtual Visit via Telephone Note  I connected with  Megan Pham on 10/19/21 at  9:30 AM EST by telephone and verified that I am speaking with the correct person using two identifiers.  Medicare Annual Wellness visit completed telephonically due to Covid-19 pandemic.   Persons participating in this call: This Health Coach and this patient.   Location: Patient: home Provider: office   I discussed the limitations, risks, security and privacy concerns of performing an evaluation and management service by telephone and the availability of in person appointments. The patient expressed understanding and agreed to proceed.  Unable to perform video visit due to video visit attempted and failed and/or patient does not have video capability.   Some vital signs may be absent or patient reported.   Willette Brace, LPN   Subjective:   Megan Pham is a 76 y.o. female who presents for Medicare Annual (Subsequent) preventive examination.  Review of Systems     Cardiac Risk Factors include: advanced age (>29men, >49 women);obesity (BMI >30kg/m2)     Objective:    There were no vitals filed for this visit. There is no height or weight on file to calculate BMI.  Advanced Directives 10/19/2021 11/13/2016 12/04/2015 11/22/2015  Does Patient Have a Medical Advance Directive? No No No No  Would patient like information on creating a medical advance directive? No - Patient declined - - -    Current Medications (verified) Outpatient Encounter Medications as of 10/19/2021  Medication Sig   ibuprofen (ADVIL) 200 MG tablet Take 200 mg by mouth every 6 (six) hours as needed.   Multiple Vitamins-Minerals (CENTRUM SILVER ADULT 50+) TABS Take 1 tablet by mouth daily.   MODERNA COVID-19 BIVAL BOOSTER 50 MCG/0.5ML injection    [DISCONTINUED] oseltamivir (TAMIFLU) 75 MG capsule Take 1 capsule (75 mg total) by mouth 2 (two) times daily.   [DISCONTINUED] promethazine-dextromethorphan (PROMETHAZINE-DM)  6.25-15 MG/5ML syrup Take 5 mLs by mouth 4 (four) times daily as needed for cough.   [DISCONTINUED] Vitamin D, Ergocalciferol, (DRISDOL) 1.25 MG (50000 UNIT) CAPS capsule Take 50,000 Units by mouth every 7 (seven) days.   No facility-administered encounter medications on file as of 10/19/2021.    Allergies (verified) Cefoxitin sodium in dextrose and Mefoxin [cefoxitin]   History: Past Medical History:  Diagnosis Date   Allergy    Seasonal   Arthritis    Ulcer 1979   ulcerative colitis   Past Surgical History:  Procedure Laterality Date   ABDOMINAL HYSTERECTOMY  1973   Partial hysterectomy   APPENDECTOMY  1973   CHOLECYSTECTOMY  2000   COLONOSCOPY  2004   by Dr.James Weissman-had left sided diverticulosis   HAMMER TOE SURGERY  2004   HERNIA REPAIR     TONSILLECTOMY  1970   Family History  Problem Relation Age of Onset   Arthritis Mother    Cancer Mother        lung   Arthritis Father    Cancer Father        lung   Arthritis Maternal Aunt    Cancer Maternal Aunt        breast   Arthritis Maternal Uncle    Arthritis Paternal Aunt    Arthritis Paternal Uncle    Arthritis Maternal Grandmother    Arthritis Maternal Grandfather    Arthritis Paternal Grandmother    Cancer Paternal Grandmother        breast   Arthritis Paternal Grandfather    Arthritis Son  Healthy Grandchild        x 2   Colon cancer Neg Hx    Social History   Socioeconomic History   Marital status: Married    Spouse name: Not on file   Number of children: Not on file   Years of education: Not on file   Highest education level: Not on file  Occupational History   Occupation: retired  Tobacco Use   Smoking status: Former    Types: Cigarettes    Quit date: 09/09/2006    Years since quitting: 15.1   Smokeless tobacco: Never  Substance and Sexual Activity   Alcohol use: No   Drug use: No   Sexual activity: Never  Other Topics Concern   Not on file  Social History Narrative   Not on  file   Social Determinants of Health   Financial Resource Strain: Low Risk    Difficulty of Paying Living Expenses: Not hard at all  Food Insecurity: No Food Insecurity   Worried About Charity fundraiser in the Last Year: Never true   [place] in the Last Year: Never true  Transportation Needs: No Transportation Needs   Lack of Transportation (Medical): No   Lack of Transportation (Non-Medical): No  Physical Activity: Sufficiently Active   Days of Exercise per Week: 3 days   Minutes of Exercise per Session: 60 min  Stress: No Stress Concern Present   Feeling of Stress : Not at all  Social Connections: Moderately Isolated   Frequency of Communication with Friends and Family: Three times a week   Frequency of Social Gatherings with Friends and Family: More than three times a week   Attends Religious Services: More than 4 times per year   Active Member of Clubs or Organizations: No   Attends Archivist Meetings: Never   Marital Status: Widowed    Tobacco Counseling Counseling given: Not Answered   Clinical Intake:  Pre-visit preparation completed: Yes  Pain : No/denies pain     BMI - recorded: 31.68 Nutritional Status: BMI > 30  Obese Nutritional Risks: None Diabetes: No  How often do you need to have someone help you when you read instructions, pamphlets, or other written materials from your doctor or pharmacy?: 1 - Never  Diabetic?no  Interpreter Needed?: No  Information entered by :: Charlott Rakes, LPN   Activities of Daily Living In your present state of health, do you have any difficulty performing the following activities: 10/19/2021  Hearing? Y  Comment wears hearing aids  Vision? N  Difficulty concentrating or making decisions? N  Walking or climbing stairs? N  Dressing or bathing? N  Doing errands, shopping? N  Preparing Food and eating ? N  Using the Toilet? N  In the past six months, have you accidently leaked urine? N  Do you  have problems with loss of bowel control? N  Managing your Medications? N  Managing your Finances? N  Housekeeping or managing your Housekeeping? N  Some recent data might be hidden    Patient Care Team: Vivi Barrack, MD as PCP - General (Family Medicine)  Indicate any recent Medical Services you may have received from other than [facility] providers in the past year (date may be approximate).     Assessment:   This is a routine wellness examination for Megan Pham.  Hearing/Vision screen Hearing Screening - Comments:: Pt wears hearing aids  Vision Screening - Comments:: Pt follows up with [facility] will  find a new provider   Dietary issues and exercise activities discussed: Current Exercise Habits: Structured exercise class, Type of exercise: Other - see comments, Time (Minutes): 60, Frequency (Times/Week): 3, Weekly Exercise (Minutes/Week): 180   Goals Addressed             This Visit's Progress    Patient Stated       Lose weight        Depression Screen PHQ 2/9 Scores 10/19/2021 03/02/2018 12/15/2017 11/13/2016 11/04/2016 11/04/2016 10/16/2015  PHQ - 2 Score 0 0 0 0 0 0 0  PHQ- 9 Score - - - - - 0 -    Fall Risk Fall Risk  10/19/2021 03/02/2018 12/15/2017 11/13/2016 11/04/2016  Falls in the past year? 0 No No Yes No  Comment - - - Tripped while painting room  -  Number falls in past yr: 0 - - 1 -  Injury with Fall? 0 - - No -  Follow up Falls prevention discussed - - - -    FALL RISK PREVENTION PERTAINING TO THE HOME:  Any stairs in or around the home? Yes  If so, are there any without handrails? No  Home free of loose throw rugs in walkways, pet beds, electrical cords, etc? Yes  Adequate lighting in your home to reduce risk of falls? Yes   ASSISTIVE DEVICES UTILIZED TO PREVENT FALLS:  Life alert? No  Use of a cane, walker or w/c? No  Grab bars in the bathroom? No  Shower chair or bench in shower? No  Elevated toilet seat or a handicapped toilet? No   TIMED UP AND  GO:  Was the test performed? No .  Cognitive Function:Declined         Immunizations Immunization History  Administered Date(s) Administered   Fluad Quad(high Dose 65+) 06/25/2019, 06/19/2021   Influenza Split 06/14/2011   Influenza, High Dose Seasonal PF 06/18/2016, 05/20/2017, 06/02/2018   Influenza,inj,Quad PF,6+ Mos 05/23/2014, 05/30/2015   Moderna Sars-Covid-2 Vaccination 10/22/2019, 11/19/2019   Pneumococcal Conjugate-13 10/16/2015   Pneumococcal Polysaccharide-23 10/11/2010, 11/13/2016   Tdap 11/13/2016    TDAP status: Up to date  Flu Vaccine status: Up to date  Pneumococcal vaccine status: Up to date  Covid-19 vaccine status: Completed vaccines  Qualifies for Shingles Vaccine? Yes   Zostavax completed No   Shingrix Completed?: No.    Education has been provided regarding the importance of this vaccine. Patient has been advised to call insurance company to determine out of pocket expense if they have not yet received this vaccine. Advised may also receive vaccine at local pharmacy or Health Dept. Verbalized acceptance and understanding.  Screening Tests Health Maintenance  Topic Date Due   Hepatitis C Screening  Never done   Zoster Vaccines- Shingrix (1 of 2) Never done   COVID-19 Vaccine (3 - Booster for Moderna series) 01/14/2020   COLONOSCOPY (Pts 45-79yrs Insurance coverage will need to be confirmed)  12/03/2020   MAMMOGRAM  01/11/2022   TETANUS/TDAP  11/14/2026   Pneumonia Vaccine 72+ Years old  Completed   INFLUENZA VACCINE  Completed   DEXA SCAN  Completed   HPV VACCINES  Aged Out    Health Maintenance  Health Maintenance Due  Topic Date Due   Hepatitis C Screening  Never done   Zoster Vaccines- Shingrix (1 of 2) Never done   COVID-19 Vaccine (3 - Booster for Moderna series) 01/14/2020   COLONOSCOPY (Pts 45-8yrs Insurance coverage will need to be confirmed)  12/03/2020  Colorectal cancer screening: Type of screening: Colonoscopy. Completed  11/2715. Repeat every 5 years  Mammogram status: Completed 01/11/21. Repeat every year  Bone Density status: Completed 11/24/18. Results reflect: Bone density results: OSTEOPENIA. Repeat every 2 years.   Additional Screening:  Hepatitis C Screening: does qualify;   Vision Screening: Recommended annual ophthalmology exams for early detection of glaucoma and other disorders of the eye. Is the patient up to date with their annual eye exam?  Yes  Who is the provider or what is the name of the office in which the patient attends annual eye exams? Pt will follow up new provider  If pt is not established with a provider, would they like to be referred to a provider to establish care? No .   Dental Screening: Recommended annual dental exams for proper oral hygiene  Community Resource Referral / Chronic Care Management: CRR required this visit?  No   CCM required this visit?  No      Plan:     I have personally reviewed and noted the following in the patients chart:   Medical and social history Use of alcohol, tobacco or illicit drugs  Current medications and supplements including opioid prescriptions.  Functional ability and status Nutritional status Physical activity Advanced directives List of other physicians Hospitalizations, surgeries, and ER visits in previous 12 months Vitals Screenings to include cognitive, depression, and falls Referrals and appointments  In addition, I have reviewed and discussed with patient certain preventive protocols, quality metrics, and best practice recommendations. A written personalized care plan for preventive services as well as general preventive health recommendations were provided to patient.     Willette Brace, LPN   02/21/1833   Nurse Notes: None

## 2021-10-25 ENCOUNTER — Encounter: Payer: Medicare HMO | Admitting: Family Medicine

## 2021-11-01 ENCOUNTER — Encounter: Payer: Self-pay | Admitting: Family Medicine

## 2021-11-01 ENCOUNTER — Ambulatory Visit (INDEPENDENT_AMBULATORY_CARE_PROVIDER_SITE_OTHER): Payer: Medicare Other | Admitting: Family Medicine

## 2021-11-01 ENCOUNTER — Other Ambulatory Visit: Payer: Self-pay

## 2021-11-01 VITALS — BP 172/85 | HR 72 | Temp 98.4°F | Ht 62.0 in | Wt 170.2 lb

## 2021-11-01 DIAGNOSIS — M150 Primary generalized (osteo)arthritis: Secondary | ICD-10-CM

## 2021-11-01 DIAGNOSIS — Z0001 Encounter for general adult medical examination with abnormal findings: Secondary | ICD-10-CM

## 2021-11-01 DIAGNOSIS — M8580 Other specified disorders of bone density and structure, unspecified site: Secondary | ICD-10-CM | POA: Diagnosis not present

## 2021-11-01 DIAGNOSIS — M25512 Pain in left shoulder: Secondary | ICD-10-CM | POA: Diagnosis not present

## 2021-11-01 DIAGNOSIS — Z13220 Encounter for screening for lipoid disorders: Secondary | ICD-10-CM | POA: Diagnosis not present

## 2021-11-01 DIAGNOSIS — E559 Vitamin D deficiency, unspecified: Secondary | ICD-10-CM | POA: Diagnosis not present

## 2021-11-01 DIAGNOSIS — M159 Polyosteoarthritis, unspecified: Secondary | ICD-10-CM

## 2021-11-01 DIAGNOSIS — R739 Hyperglycemia, unspecified: Secondary | ICD-10-CM

## 2021-11-01 DIAGNOSIS — H0230 Blepharochalasis unspecified eye, unspecified eyelid: Secondary | ICD-10-CM | POA: Diagnosis not present

## 2021-11-01 DIAGNOSIS — E2839 Other primary ovarian failure: Secondary | ICD-10-CM

## 2021-11-01 LAB — CBC
HCT: 43.2 % (ref 36.0–46.0)
Hemoglobin: 14 g/dL (ref 12.0–15.0)
MCHC: 32.4 g/dL (ref 30.0–36.0)
MCV: 90.1 fl (ref 78.0–100.0)
Platelets: 332 10*3/uL (ref 150.0–400.0)
RBC: 4.79 Mil/uL (ref 3.87–5.11)
RDW: 12.8 % (ref 11.5–15.5)
WBC: 5.9 10*3/uL (ref 4.0–10.5)

## 2021-11-01 LAB — LIPID PANEL
Cholesterol: 242 mg/dL — ABNORMAL HIGH (ref 0–200)
HDL: 64 mg/dL (ref >39.00)
LDL Cholesterol: 154 mg/dL — ABNORMAL HIGH (ref 0–99)
NonHDL: 178
Total CHOL/HDL Ratio: 4
Triglycerides: 121 mg/dL (ref 0.0–149.0)
VLDL: 24.2 mg/dL (ref 0.0–40.0)

## 2021-11-01 LAB — HEMOGLOBIN A1C: Hgb A1c MFr Bld: 6 % (ref 4.6–6.5)

## 2021-11-01 LAB — COMPREHENSIVE METABOLIC PANEL
ALT: 18 U/L (ref 0–35)
AST: 21 U/L (ref 0–37)
Albumin: 4.6 g/dL (ref 3.5–5.2)
Alkaline Phosphatase: 101 U/L (ref 39–117)
BUN: 19 mg/dL (ref 6–23)
CO2: 31 mEq/L (ref 19–32)
Calcium: 9.9 mg/dL (ref 8.4–10.5)
Chloride: 103 mEq/L (ref 96–112)
Creatinine, Ser: 0.77 mg/dL (ref 0.40–1.20)
GFR: 75.15 mL/min (ref >60.00)
Glucose, Bld: 107 mg/dL — ABNORMAL HIGH (ref 70–99)
Potassium: 5.3 mEq/L — ABNORMAL HIGH (ref 3.5–5.1)
Sodium: 141 mEq/L (ref 135–145)
Total Bilirubin: 0.6 mg/dL (ref 0.2–1.2)
Total Protein: 7.7 g/dL (ref 6.0–8.3)

## 2021-11-01 LAB — VITAMIN D 25 HYDROXY (VIT D DEFICIENCY, FRACTURES): VITD: 49.57 ng/mL (ref 30.00–100.00)

## 2021-11-01 LAB — TSH: TSH: 3.34 u[IU]/mL (ref 0.35–5.50)

## 2021-11-01 MED ORDER — CELECOXIB 200 MG PO CAPS: 200.0000 mg | ORAL_CAPSULE | Freq: Two times a day (BID) | ORAL | 0 refills | Status: DC

## 2021-11-01 NOTE — Assessment & Plan Note (Signed)
Check DEXA this spring with her mammogram.

## 2021-11-01 NOTE — Patient Instructions (Addendum)
It was very nice to see you today!  We will check blood work today.  Keep an eye on your blood pressure and let me know if it is persistently 150/90 or higher.  Please start the celebrex for your shoulder. Let me know if not improving.   We will see you back in a year for your next physical. Come back sooner if needed.   Take care, Dr Jerline Pain  PLEASE NOTE:  If you had any lab tests please let us know if you have not heard back within a few days. You may see your results on mychart before we have a chance to review them but we will give you a call once they are reviewed by [place]. If we ordered any referrals today, please let us know if you have not heard from their office within the next week.   Please try these tips to maintain a healthy lifestyle:  Eat at least 3 REAL meals and 1-2 snacks per day.  Aim for no more than 5 hours between eating.  If you eat breakfast, please do so within one hour of getting up.   Each meal should contain half fruits/vegetables, one quarter protein, and one quarter carbs (no bigger than a computer mouse)  Cut down on sweet beverages. This includes juice, soda, and sweet tea.   Drink at least 1 glass of water with each meal and aim for at least 8 glasses per day  Exercise at least 150 minutes every week.    Preventive Care 74 Years and Older, Female Preventive care refers to lifestyle choices and visits with your health care provider that can promote health and wellness. Preventive care visits are also called wellness exams. What can I expect for my preventive care visit? Counseling Your health care provider may ask you questions about your: Medical history, including: Past medical problems. Family medical history. Pregnancy and menstrual history. History of falls. Current health, including: Memory and ability to understand (cognition). Emotional well-being. Home life and relationship well-being. Sexual activity and sexual health. Lifestyle,  including: Alcohol, nicotine or tobacco, and drug use. Access to firearms. Diet, exercise, and sleep habits. Work and work Statistician. Sunscreen use. Safety issues such as seatbelt and bike helmet use. Physical exam Your health care provider will check your: Height and weight. These may be used to calculate your BMI (body mass index). BMI is a measurement that tells if you are at a healthy weight. Waist circumference. This measures the distance around your waistline. This measurement also tells if you are at a healthy weight and may help predict your risk of certain diseases, such as type 2 diabetes and high blood pressure. Heart rate and blood pressure. Body temperature. Skin for abnormal spots. What immunizations do I need? Vaccines are usually given at various ages, according to a schedule. Your health care provider will recommend vaccines for you based on your age, medical history, and lifestyle or other factors, such as travel or where you work. What tests do I need? Screening Your health care provider may recommend screening tests for certain conditions. This may include: Lipid and cholesterol levels. Hepatitis C test. Hepatitis B test. HIV (human immunodeficiency virus) test. STI (sexually transmitted infection) testing, if you are at risk. Lung cancer screening. Colorectal cancer screening. Diabetes screening. This is done by checking your blood sugar (glucose) after you have not eaten for a while (fasting). Mammogram. Talk with your health care provider about how often you should have regular mammograms. BRCA-related cancer  screening. This may be done if you have a family history of breast, ovarian, tubal, or peritoneal cancers. Bone density scan. This is done to screen for osteoporosis. Talk with your health care provider about your test results, treatment options, and if necessary, the need for more tests. Follow these instructions at home: Eating and drinking  Eat a  diet that includes fresh fruits and vegetables, whole grains, lean protein, and low-fat dairy products. Limit your intake of foods with high amounts of sugar, saturated fats, and salt. Take vitamin and mineral supplements as recommended by your health care provider. Do not drink alcohol if your health care provider tells you not to drink. If you drink alcohol: Limit how much you have to 0-1 drink a day. Know how much alcohol is in your drink. In the [place] one drink equals one 12 oz bottle of beer (355 mL), one 5 oz glass of wine (148 mL), or one 1 oz glass of hard liquor (44 mL). Lifestyle Brush your teeth every morning and night with fluoride toothpaste. Floss one time each day. Exercise for at least 30 minutes 5 or more days each week. Do not use any products that contain nicotine or tobacco. These products include cigarettes, chewing tobacco, and vaping devices, such as e-cigarettes. If you need help quitting, ask your health care provider. Do not use drugs. If you are sexually active, practice safe sex. Use a condom or other form of protection in order to prevent STIs. Take aspirin only as told by your health care provider. Make sure that you understand how much to take and what form to take. Work with your health care provider to find out whether it is safe and beneficial for you to take aspirin daily. Ask your health care provider if you need to take a cholesterol-lowering medicine (statin). Find healthy ways to manage stress, such as: Meditation, yoga, or listening to music. Journaling. Talking to a trusted person. Spending time with friends and family. Minimize exposure to UV radiation to reduce your risk of skin cancer. Safety Always wear your seat belt while driving or riding in a vehicle. Do not drive: If you have been drinking alcohol. Do not ride with someone who has been drinking. When you are tired or distracted. While texting. If you have been using any mind-altering  substances or drugs. Wear a helmet and other protective equipment during sports activities. If you have firearms in your house, make sure you follow all gun safety procedures. What's next? Visit your health care provider once a year for an annual wellness visit. Ask your health care provider how often you should have your eyes and teeth checked. Stay up to date on all vaccines. This information is not intended to replace advice given to you by your health care provider. Make sure you discuss any questions you have with your health care provider. Document Revised: 02/21/2021 Document Reviewed: 02/21/2021 Elsevier Patient Education  [employer].

## 2021-11-01 NOTE — Assessment & Plan Note (Signed)
Will be following up with ophthalmology.

## 2021-11-01 NOTE — Assessment & Plan Note (Signed)
Mild flare in left shoulder.  She also likely has some underlying tendinitis as well.  We will start Celebrex.  This is worked well for her in the past.  She will let us know if not improving and we will consider imaging or referral to sports medicine at that time.

## 2021-11-01 NOTE — Progress Notes (Signed)
Chief Complaint:  Megan Pham is a 76 y.o. female who presents today for her annual comprehensive physical exam.    Assessment/Plan:  New/Acute Problems: Elevated blood pressure Elevated 170s over 80s today.  She does not wish to start antihypertensives.  She does not check blood pressure at home.  We discussed importance of good blood pressure control.  She does not have any signs of endorgan damage today.  She will monitor at home for a few weeks and let us know if persistently elevated.  Left shoulder pain Likely flare of osteoarthritis and possibly biceps tendinitis.  Could be causing some elevation of her blood pressure as well.  We will start Celebrex.  She has done well with this in the past.  She will let me know if not improving.  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   Osteoarthritis Mild flare in left shoulder.  She also likely has some underlying tendinitis as well.  We will start Celebrex.  This is worked well for her in the past.  She will let us know if not improving and we will consider imaging or referral to sports medicine at that time.  Osteopenia Check DEXA this spring with her mammogram.  Preventative Healthcare: Check Labs. UTD on other vaccines. Schedule for bone density.  Patient Counseling(The following topics were reviewed and/or handout was given):  -Nutrition: Stressed importance of moderation in sodium/caffeine intake, saturated fat and cholesterol, caloric balance, sufficient intake of fresh fruits, vegetables, and fiber.  -Stressed the importance of regular exercise.   -Substance Abuse: Discussed cessation/primary prevention of tobacco, alcohol, or other drug use; driving or other dangerous activities under the influence; availability of treatment for abuse.   -Injury prevention: Discussed safety belts, safety helmets, smoke detector, smoking near bedding or upholstery.   -Sexuality: Discussed  sexually transmitted diseases, partner selection, use of condoms, avoidance of unintended pregnancy and contraceptive alternatives.   -Dental health: Discussed importance of regular tooth brushing, flossing, and dental visits.  -Health maintenance and immunizations reviewed. Please refer to Health maintenance section.  Return to care in 1 year for next preventative visit.     Subjective:  HPI:  She has no acute complaints today.   Patient here with knot on the side of her left foot. This started about a month ago. She notes she still have some pain. However this seems to improving. She recently switched her shoes and thinks this has helped. She also stopped taking Multiple Vitamins Minerals which she think was contributing.  She also complain of left shoulder pain. This started about 2 weeks. She has tried Celebrex and this has helped in the past. She took OTC medication to help alleviate the symptoms. She notes this usually occurred in the winter. She is having difficulty sleeping due to her pain. She does have a hx of osteoarthritis.  No knee pain.    Lifestyle Diet: None Specific Exercise: Likes to go on walk  Depression screen [facility] 2/9 11/01/2021  Decreased Interest 0  Down, Depressed, Hopeless 0  PHQ - 2 Score 0  Altered sleeping -  Tired, decreased energy -  Change in appetite -  Feeling bad or failure about yourself  -  Trouble concentrating -  Moving slowly or fidgety/restless -  Suicidal thoughts -  PHQ-9 Score -    Health Maintenance Due  Topic Date Due   Hepatitis C Screening  Never done     ROS: Per HPI, otherwise a complete review  of systems was negative.   PMH:  The following were reviewed and entered/updated in epic: Past Medical History:  Diagnosis Date   Allergy    Seasonal   Arthritis    Ulcer 1979   ulcerative colitis   Patient Active Problem List   Diagnosis Date Noted   Pseudoptosis 11/01/2021   Vaginal irritation 03/02/2018   Osteopenia  11/28/2016   Osteoarthritis 10/11/2010   Past Surgical History:  Procedure Laterality Date   ABDOMINAL HYSTERECTOMY  1973   Partial hysterectomy   APPENDECTOMY  1973   CHOLECYSTECTOMY  2000   COLONOSCOPY  2004   by Dr.James Weissman-had left sided diverticulosis   HAMMER TOE SURGERY  2004   HERNIA REPAIR     TONSILLECTOMY  1970    Family History  Problem Relation Age of Onset   Arthritis Mother    Cancer Mother        lung   Arthritis Father    Cancer Father        lung   Arthritis Maternal Aunt    Cancer Maternal Aunt        breast   Arthritis Maternal Uncle    Arthritis Paternal Aunt    Arthritis Paternal Uncle    Arthritis Maternal Grandmother    Arthritis Maternal Grandfather    Arthritis Paternal Grandmother    Cancer Paternal Grandmother        breast   Arthritis Paternal Grandfather    Arthritis Son    Healthy Grandchild        x 2   Colon cancer Neg Hx     Medications- reviewed and updated Current Outpatient Medications  Medication Sig Dispense Refill   celecoxib (CELEBREX) 200 MG capsule Take 1 capsule (200 mg total) by mouth 2 (two) times daily. 60 capsule 0   ibuprofen (ADVIL) 200 MG tablet Take 200 mg by mouth every 6 (six) hours as needed.     Multiple Vitamins-Minerals (CENTRUM SILVER ADULT 50+) TABS Take 1 tablet by mouth daily.     No current facility-administered medications for this visit.    Allergies-reviewed and updated Allergies  Allergen Reactions   Cefoxitin Sodium In Dextrose Hives   Mefoxin [Cefoxitin] Hives    "Myfoxin" during surgery per pt.     Social History   Socioeconomic History   Marital status: Married    Spouse name: Not on file   Number of children: Not on file   Years of education: Not on file   Highest education level: Not on file  Occupational History   Occupation: retired  Tobacco Use   Smoking status: Former    Types: Cigarettes    Quit date: 09/09/2006    Years since quitting: 15.1   Smokeless tobacco:  Never  Substance and Sexual Activity   Alcohol use: No   Drug use: No   Sexual activity: Never  Other Topics Concern   Not on file  Social History Narrative   Not on file   Social Determinants of Health   Financial Resource Strain: Low Risk    Difficulty of Paying Living Expenses: Not hard at all  Food Insecurity: No Food Insecurity   Worried About Charity fundraiser in the Last Year: Never true   [place] in the Last Year: Never true  Transportation Needs: No Transportation Needs   Lack of Transportation (Medical): No   Lack of Transportation (Non-Medical): No  Physical Activity: Sufficiently Active   Days of Exercise per Week:  3 days   Minutes of Exercise per Session: 60 min  Stress: No Stress Concern Present   Feeling of Stress : Not at all  Social Connections: Moderately Isolated   Frequency of Communication with Friends and Family: Three times a week   Frequency of Social Gatherings with Friends and Family: More than three times a week   Attends Religious Services: More than 4 times per year   Active Member of Clubs or Organizations: No   Attends Archivist Meetings: Never   Marital Status: Widowed        Objective:  Physical Exam: BP (!) 172/85 (BP Location: Left Arm)    Pulse 72    Temp 98.4 F (36.9 C) (Temporal)    Ht 5\' 2"  (1.575 m)    Wt 170 lb 3.2 oz (77.2 kg)    SpO2 99%    BMI 31.13 kg/m   Body mass index is 31.13 kg/m. Wt Readings from Last 3 Encounters:  11/01/21 170 lb 3.2 oz (77.2 kg)  03/22/20 173 lb 3.2 oz (78.6 kg)  01/24/20 174 lb 9.6 oz (79.2 kg)   Gen: NAD, resting comfortably HEENT: TMs normal bilaterally. OP clear. No thyromegaly noted.  CV: RRR with no murmurs appreciated Pulm: NWOB, CTAB with no crackles, wheezes, or rhonchi GI: Normal bowel sounds present. Soft, Nontender, Nondistended. MSK: no edema, cyanosis, or clubbing noted - Left Shoulder: No deformities.  Tenderness palpation along short head of biceps tendon.   Tenderness with resisted supraspinatus testing and internal rotation.  Neurovascular intact distally. Skin: warm, dry Neuro: CN2-12 grossly intact. Strength 5/5 in upper and lower extremities. Reflexes symmetric and intact bilaterally.  Psych: Normal affect and thought content      I,Savera Zaman,acting as a scribe for Dimas Chyle, MD.,have documented all relevant documentation on the behalf of Dimas Chyle, MD,as directed by  Dimas Chyle, MD while in the presence of Dimas Chyle, MD.   I, Dimas Chyle, MD, have reviewed all documentation for this visit. The documentation on 11/01/21 for the exam, diagnosis, procedures, and orders are all accurate and complete.  Algis Greenhouse. Jerline Pain, MD 11/01/2021 9:44 AM

## 2021-11-02 ENCOUNTER — Encounter: Payer: Self-pay | Admitting: Family Medicine

## 2021-11-02 DIAGNOSIS — E785 Hyperlipidemia, unspecified: Secondary | ICD-10-CM | POA: Insufficient documentation

## 2021-11-02 NOTE — Progress Notes (Signed)
Please inform patient of the following:  Blood sugar is borderline elevated.  Do not need to start meds but she should continue working on diet and exercise.  Her cholesterol is elevated.  She will benefit from starting cholesterol medication to lower risk of heart attack and stroke.  Please send in Lipitor 40 mg daily if she is willing to start.  The rest of her labs are all stable.  We can recheck in a year.

## 2021-11-06 ENCOUNTER — Telehealth: Payer: Self-pay | Admitting: Family Medicine

## 2021-11-06 NOTE — Telephone Encounter (Signed)
Pt called wanting lab results. Patient was advised MA or MD will call with results. Front desk cannot provide medical results to patients.

## 2021-11-06 NOTE — Telephone Encounter (Signed)
Please see result notes.  

## 2021-11-27 ENCOUNTER — Other Ambulatory Visit: Payer: Self-pay

## 2021-11-27 ENCOUNTER — Telehealth: Payer: Self-pay | Admitting: Family Medicine

## 2021-11-27 MED ORDER — ATORVASTATIN CALCIUM 40 MG PO TABS: 40.0000 mg | ORAL_TABLET | Freq: Every day | ORAL | 3 refills | Status: DC

## 2021-11-27 NOTE — Telephone Encounter (Signed)
Pt is asking to have her bone density order sent to [place].  ? ?She would also like to have Dr Jerline Pain ordered the Lipitor they had discussed per her last visit. She stated her BP is still on the high side. Please advise ?

## 2021-11-27 NOTE — Telephone Encounter (Signed)
Bone Density orders already placed and Rx sent into pharmacy, called pt to advise.  ?

## 2021-11-28 ENCOUNTER — Other Ambulatory Visit: Payer: Self-pay | Admitting: Family Medicine

## 2021-12-19 ENCOUNTER — Telehealth: Payer: Self-pay | Admitting: *Deleted

## 2021-12-19 NOTE — Telephone Encounter (Signed)
[facility] referral for Bone Density faxed to [phone] ?

## 2021-12-26 ENCOUNTER — Other Ambulatory Visit: Payer: Self-pay | Admitting: Family Medicine

## 2022-01-15 DIAGNOSIS — M85851 Other specified disorders of bone density and structure, right thigh: Secondary | ICD-10-CM | POA: Diagnosis not present

## 2022-01-15 DIAGNOSIS — Z1231 Encounter for screening mammogram for malignant neoplasm of breast: Secondary | ICD-10-CM | POA: Diagnosis not present

## 2022-01-15 DIAGNOSIS — M85852 Other specified disorders of bone density and structure, left thigh: Secondary | ICD-10-CM | POA: Diagnosis not present

## 2022-01-15 LAB — HM DEXA SCAN

## 2022-01-21 ENCOUNTER — Encounter: Payer: Self-pay | Admitting: Family Medicine

## 2022-04-04 ENCOUNTER — Encounter: Payer: Self-pay | Admitting: Genetic Counselor

## 2022-04-04 ENCOUNTER — Telehealth: Payer: Self-pay | Admitting: Genetic Counselor

## 2022-04-04 NOTE — Telephone Encounter (Signed)
Patient called due to CTNNA1 mutation in sister.  Genetics appt schduled 9/19 at 10am.

## 2022-04-08 ENCOUNTER — Telehealth: Payer: Self-pay | Admitting: Genetic Counselor

## 2022-04-08 NOTE — Telephone Encounter (Signed)
Attempted to call per 7/28 in basket, pt voicemail not set up

## 2022-05-16 DIAGNOSIS — H353132 Nonexudative age-related macular degeneration, bilateral, intermediate dry stage: Secondary | ICD-10-CM | POA: Diagnosis not present

## 2022-05-16 DIAGNOSIS — H33191 Other retinoschisis and retinal cysts, right eye: Secondary | ICD-10-CM | POA: Diagnosis not present

## 2022-05-16 DIAGNOSIS — Z961 Presence of intraocular lens: Secondary | ICD-10-CM | POA: Diagnosis not present

## 2022-05-16 DIAGNOSIS — H26493 Other secondary cataract, bilateral: Secondary | ICD-10-CM | POA: Diagnosis not present

## 2022-05-23 DIAGNOSIS — H26492 Other secondary cataract, left eye: Secondary | ICD-10-CM | POA: Diagnosis not present

## 2022-05-23 DIAGNOSIS — H26491 Other secondary cataract, right eye: Secondary | ICD-10-CM | POA: Diagnosis not present

## 2022-05-23 DIAGNOSIS — H26493 Other secondary cataract, bilateral: Secondary | ICD-10-CM | POA: Diagnosis not present

## 2022-05-27 ENCOUNTER — Other Ambulatory Visit: Payer: Self-pay | Admitting: Genetic Counselor

## 2022-05-27 ENCOUNTER — Inpatient Hospital Stay: Payer: Medicare Other | Attending: Oncology | Admitting: Genetic Counselor

## 2022-05-27 ENCOUNTER — Inpatient Hospital Stay: Payer: Medicare Other

## 2022-05-27 ENCOUNTER — Encounter: Payer: Self-pay | Admitting: Genetic Counselor

## 2022-05-27 DIAGNOSIS — Z801 Family history of malignant neoplasm of trachea, bronchus and lung: Secondary | ICD-10-CM

## 2022-05-27 DIAGNOSIS — Z803 Family history of malignant neoplasm of breast: Secondary | ICD-10-CM

## 2022-05-27 DIAGNOSIS — Z800 Family history of malignant neoplasm of digestive organs: Secondary | ICD-10-CM

## 2022-05-27 DIAGNOSIS — Z8481 Family history of carrier of genetic disease: Secondary | ICD-10-CM | POA: Diagnosis not present

## 2022-05-27 HISTORY — DX: Family history of malignant neoplasm of breast: Z80.3

## 2022-05-27 HISTORY — DX: Family history of malignant neoplasm of digestive organs: Z80.0

## 2022-05-27 LAB — GENETIC SCREENING ORDER

## 2022-05-27 NOTE — Progress Notes (Signed)
REFERRING PROVIDER: Self-referred  PRIMARY PROVIDER:  Vivi Barrack, MD  PRIMARY REASON FOR VISIT:  Encounter Diagnoses  Name Primary?   Family history of breast cancer    Family history of gastric cancer    Family history of CTNNA1 mutation        HISTORY OF PRESENT ILLNESS:   Megan Pham, a 76 y.o. female, was seen for a [place] cancer genetics consultation due to a family history of a CTNNA1  gene mutation in her sister.  Megan Pham presents to clinic today to discuss the possibility of a hereditary predisposition to cancer, to discuss genetic testing, and to further clarify her future cancer risks, as well as potential cancer risks for family members.   Megan Pham is a 76 y.o. female with no personal history of cancer.    CANCER HISTORY:  Oncology History   No history exists.    RISK FACTORS:  Mammogram within the last year: yes Number of breast biopsies: cyst aspiration >10 years ago Colonoscopy: yes;  most recent in 2017 . Hysterectomy: yes at age 64 Ovaries intact: yes.  Menarche was at age 65.  First live birth at age 76.  HRT use: more than 5 years of use Eye exams each year.  Dermatology screening: no  Past Medical History:  Diagnosis Date   Allergy    Seasonal   Arthritis    Ulcer 1979   ulcerative colitis    Past Surgical History:  Procedure Laterality Date   ABDOMINAL HYSTERECTOMY  1973   Partial hysterectomy   APPENDECTOMY  1973   CHOLECYSTECTOMY  2000   COLONOSCOPY  2004   by Dr.James Weissman-had left sided diverticulosis   HAMMER TOE SURGERY  2004   HERNIA REPAIR     TONSILLECTOMY  1970      FAMILY HISTORY:  We obtained a detailed, 4-generation family history.  Significant diagnoses are listed below: Family History  Problem Relation Age of Onset   Cancer Mother        lung   Cancer Father        throat and lung; d. 39   Breast cancer Sister 54   Other Sister        CTNNA1 mutation   Gastric cancer Sister        dx 30s; ?  colon cancer   Breast cancer Maternal Aunt        dx after 82   Cancer Maternal Uncle        ? behind ear   Cancer Maternal Grandfather        mouth   Breast cancer Paternal Grandmother        d. 47s   Cancer Paternal Grandfather        unknown type   Breast cancer Cousin        d. 78    Megan Pham's sister had genetic testing through [employer] [facility] in 2018 for the Common Hereditary Cancers Panel.  A variant of uncertain significance was detected in her testing at [facility]  [id number] (Splice donor), which was reclassified to likely pathogenic in June 2023.   Megan Pham is unaware of other previous family history of genetic testing for hereditary cancer risks.  There is no reported Ashkenazi Jewish ancestry. There is no known consanguinity.  GENETIC COUNSELING ASSESSMENT: Megan Pham is a 76 y.o. female with a family history of a known hereditary cancer syndrome. We, therefore, discussed and recommended the following at today's visit.   DISCUSSION: We  discussed that 5 - 10% of cancer is hereditary,  We reviewed the gastric cancer risks and management options for those with CTNNA1 mutations.  We discussed that given her sister has a CTNNA1, she has a 50% chance of having the same mutation. There are other genes that can be associated with hereditary breast and gastric cancer syndromes.  We discussed that testing is beneficial for several reasons, including knowing about other cancer risks, identifying potential screening and risk-reduction options that may be appropriate, and to understanding if other family members could be at risk for cancer and allowing them to undergo genetic testing.  We reviewed the characteristics, features and inheritance patterns of hereditary cancer syndromes. We also discussed genetic testing, including the appropriate family members to test, the process of testing, insurance coverage and turn-around-time for results. We discussed the implications of a negative,  positive, carrier and/or variant of uncertain significant result. We recommended Megan Pham pursue genetic testing for CTNNA1 along with other genes associated with breast and gastric cancer, which could explain the additional family history.  We discussed that testing of CTNNA1 only was available at no charge through the laboratory given her sister's recent reclassification; however, this test would not be considered comprehensive based on her additional family history of breast cancer.   Based on Megan Pham's family history of a CTNNA1 mutation and family history cancer, she meets medical criteria for genetic testing. Despite that she meets criteria, she may still have an out of pocket cost. We discussed that if her out of pocket cost for testing is over $100, the laboratory should contact them to discuss self-pay options and/or patient pay assistance programs.   We discussed that some people do not want to undergo genetic testing due to fear of genetic discrimination.  A federal law called the Genetic Information Non-Discrimination Act (GINA) of 2008 helps protect individuals against genetic discrimination based on their genetic test results.  It impacts both health insurance and employment.  With health insurance, it protects against increased premiums, being kicked off insurance or being forced to take a test in order to be insured.  For employment it protects against hiring, firing and promoting decisions based on genetic test results.  GINA does not apply to those in the [employer], those who work for companies with less than 15 employees, and new life insurance or long-term disability insurance policies.  Health status due to a cancer diagnosis is not protected under GINA.  PLAN: After considering the risks, benefits, and limitations, Megan Pham provided informed consent to pursue genetic testing and the blood sample was sent to [facility] for analysis of the Common Hereditary Cancers +RNA  Panel. Results should be available within approximately 3 weeks' time, at which point they will be disclosed by telephone to Megan Pham, as will any additional recommendations warranted by these results. Megan Pham will receive a summary of her genetic counseling visit and a copy of her results once available. This information will also be available in Epic.   Megan Pham questions were answered to her satisfaction today. Our contact information was provided should additional questions or concerns arise. Thank you for the referral and allowing [place] to share in the care of your patient.   Megan Pham M. Joette Catching, [place], [facility] Genetic Counselor [email or web address] (P) [phone]   The patient was seen for a total of 40 minutes in face-to-face genetic counseling.  The patient was seen alone.  Drs. Lindi Adie and/or Burr Medico were available to discuss this case as needed.  _______________________________________________________________________ For Office Staff:  Number of people involved in session: 1 Was an Intern/ student involved with case: no

## 2022-06-03 ENCOUNTER — Telehealth: Payer: Self-pay | Admitting: Family Medicine

## 2022-06-03 ENCOUNTER — Encounter: Payer: Self-pay | Admitting: *Deleted

## 2022-06-03 NOTE — Telephone Encounter (Signed)
Need OV last visit on 11/01/2021

## 2022-06-03 NOTE — Telephone Encounter (Signed)
Patient requested to drop off urine sample-no lab ordered-Offered office visit-refused to see another Provider-[facility] had openings 06/06/22-Patient said she could not come in-transferred to CM for help-Patient disconnected after explaining the above.

## 2022-06-04 NOTE — Telephone Encounter (Signed)
Patient is scheduled for appointment on 06/11/22

## 2022-06-10 ENCOUNTER — Ambulatory Visit: Payer: Medicare Other | Admitting: Family Medicine

## 2022-06-11 ENCOUNTER — Ambulatory Visit (INDEPENDENT_AMBULATORY_CARE_PROVIDER_SITE_OTHER): Payer: Medicare Other | Admitting: Family Medicine

## 2022-06-11 ENCOUNTER — Encounter: Payer: Self-pay | Admitting: Family Medicine

## 2022-06-11 VITALS — BP 152/71 | HR 95 | Temp 97.0°F | Ht 62.0 in | Wt 171.2 lb

## 2022-06-11 DIAGNOSIS — N390 Urinary tract infection, site not specified: Secondary | ICD-10-CM | POA: Diagnosis not present

## 2022-06-11 DIAGNOSIS — M159 Polyosteoarthritis, unspecified: Secondary | ICD-10-CM | POA: Diagnosis not present

## 2022-06-11 DIAGNOSIS — E785 Hyperlipidemia, unspecified: Secondary | ICD-10-CM

## 2022-06-11 DIAGNOSIS — Z800 Family history of malignant neoplasm of digestive organs: Secondary | ICD-10-CM

## 2022-06-11 DIAGNOSIS — I10 Essential (primary) hypertension: Secondary | ICD-10-CM | POA: Diagnosis not present

## 2022-06-11 LAB — POCT URINALYSIS DIPSTICK
Bilirubin, UA: NEGATIVE
Blood, UA: NEGATIVE
Glucose, UA: NEGATIVE
Ketones, UA: NEGATIVE
Nitrite, UA: NEGATIVE
Protein, UA: NEGATIVE
Spec Grav, UA: 1.015 (ref 1.010–1.025)
Urobilinogen, UA: 0.2 E.U./dL
pH, UA: 6 (ref 5.0–8.0)

## 2022-06-11 MED ORDER — NITROFURANTOIN MONOHYD MACRO 100 MG PO CAPS: 100.0000 mg | ORAL_CAPSULE | Freq: Two times a day (BID) | ORAL | 0 refills | Status: DC

## 2022-06-11 NOTE — Assessment & Plan Note (Signed)
Stable on Celebrex 200 mg twice daily as needed.

## 2022-06-11 NOTE — Patient Instructions (Signed)
It was very nice to see you today!  Please start the [facility].  Make sure that you are getting plenty of fluids.  We will call you later this week if we need to make a change.  Please keep an eye on your blood pressure and let us know if persistently 150/90 or higher.  We will see you back in March for your annual checkup.  Come back sooner if needed.  Take care, Dr Jerline Pain  PLEASE NOTE:  If you had any lab tests please let us know if you have not heard back within a few days. You may see your results on mychart before we have a chance to review them but we will give you a call once they are reviewed by [place]. If we ordered any referrals today, please let us know if you have not heard from their office within the next week.   Please try these tips to maintain +a healthy lifestyle:  Eat at least 3 REAL meals and 1-2 snacks per day.  Aim for no more than 5 hours between eating.  If you eat breakfast, please do so within one hour of getting up.   Each meal should contain half fruits/vegetables, one quarter protein, and one quarter carbs (no bigger than a computer mouse)  Cut down on sweet beverages. This includes juice, soda, and sweet tea.   Drink at least 1 glass of water with each meal and aim for at least 8 glasses per day  Exercise at least 150 minutes every week.

## 2022-06-11 NOTE — Assessment & Plan Note (Signed)
Sister recently passed away with stomach cancer.  We will place referral to GI for evaluation.  She does have genetic screening which is pending.

## 2022-06-11 NOTE — Assessment & Plan Note (Signed)
Doing well on Lipitor 40 mg daily.  Check lipids next blood draw.

## 2022-06-11 NOTE — Assessment & Plan Note (Signed)
Slightly above goal per JNC 8.   Pressure today is much better than previous readings.  She does not wish to start any antihypertensives at this point.  She will continue to monitor at home and let us know if persistently elevated.

## 2022-06-11 NOTE — Progress Notes (Signed)
   Megan Pham is a 76 y.o. female who presents today for an office visit.  Assessment/Plan:  New/Acute Problems: UTI History and UA consistent with UTI.  No signs of systemic illness.  Empirically start [place] while we await culture results.  Encouraged hydration.  Chronic Problems Addressed Today: Essential hypertension Slightly above goal per JNC 8.   Pressure today is much better than previous readings.  She does not wish to start any antihypertensives at this point.  She will continue to monitor at home and let us know if persistently elevated.  Family history of gastric cancer Sister recently passed away with stomach cancer.  We will place referral to GI for evaluation.  She does have genetic screening which is pending.  Dyslipidemia Doing well on Lipitor 40 mg daily.  Check lipids next blood draw.  Osteoarthritis Stable on Celebrex 200 mg twice daily as needed.     Subjective:  HPI:  Patient here with UTI symptoms for the last two weeks. Feels like previous UTIs. Some low back pain and frequency.  No fevers or chills.  No specific treatments tried.       Objective:  Physical Exam: BP (!) 152/74   Pulse 95   Temp (!) 97 F (36.1 C) (Temporal)   Ht '5\' 2"'$  (1.575 m)   Wt 171 lb 3.2 oz (77.7 kg)   SpO2 98%   BMI 31.31 kg/m   Gen: No acute distress, resting comfortably CV: Regular rate and rhythm with no murmurs appreciated Pulm: Normal work of breathing, clear to auscultation bilaterally with no crackles, wheezes, or rhonchi Neuro: Grossly normal, moves all extremities Psych: Normal affect and thought content      Megan Pham M. Jerline Pain, MD 06/11/2022 11:14 AM

## 2022-06-12 LAB — URINE CULTURE
MICRO NUMBER:: 14000880
SPECIMEN QUALITY:: ADEQUATE

## 2022-06-13 NOTE — Progress Notes (Signed)
Please inform patient of the following:  Urine culture is inconclusive. Would like for her to finish her antibiotics and let us know if not improving.  Algis Greenhouse. Jerline Pain, MD 06/13/2022 1:04 PM

## 2022-06-14 NOTE — Progress Notes (Signed)
I can't see any results that have come back. She should contact her oncologist who ordered the test.  Algis Greenhouse. Jerline Pain, MD 06/14/2022 9:50 AM

## 2022-06-18 ENCOUNTER — Encounter: Payer: Self-pay | Admitting: Genetic Counselor

## 2022-06-18 DIAGNOSIS — Z1379 Encounter for other screening for genetic and chromosomal anomalies: Secondary | ICD-10-CM | POA: Insufficient documentation

## 2022-06-19 ENCOUNTER — Encounter: Payer: Self-pay | Admitting: Genetic Counselor

## 2022-06-19 ENCOUNTER — Telehealth: Payer: Self-pay | Admitting: Genetic Counselor

## 2022-06-19 ENCOUNTER — Ambulatory Visit: Payer: Self-pay | Admitting: Genetic Counselor

## 2022-06-19 DIAGNOSIS — Z1589 Genetic susceptibility to other disease: Secondary | ICD-10-CM

## 2022-06-19 DIAGNOSIS — Z8481 Family history of carrier of genetic disease: Secondary | ICD-10-CM

## 2022-06-19 DIAGNOSIS — Z1379 Encounter for other screening for genetic and chromosomal anomalies: Secondary | ICD-10-CM

## 2022-06-19 DIAGNOSIS — Z800 Family history of malignant neoplasm of digestive organs: Secondary | ICD-10-CM

## 2022-06-19 DIAGNOSIS — Z803 Family history of malignant neoplasm of breast: Secondary | ICD-10-CM

## 2022-06-19 HISTORY — DX: Genetic susceptibility to other disease: Z15.89

## 2022-06-19 NOTE — Progress Notes (Signed)
GENETIC TEST RESULTS  Patient Name: Megan Pham Patient Age: 76 y.o. Encounter Date: 06/19/2022  Referring Provider: Self-referred  Megan Pham was seen in the [place] clinic on May 27, 2022 due to a family history of cancer and a family history of a known CTNNA1 mutation in her sistser. Please refer to the prior Genetics clinic note for more information regarding Megan Pham medical and family histories and our assessment at the time.   Family history:  We obtained a detailed, 4-generation family history.  Significant diagnoses are listed below:      Family History  Problem Relation Age of Onset   Cancer Mother          lung   Cancer Father          throat and lung; d. 28   Breast cancer Sister 70   Other Sister          CTNNA1 mutation   Gastric cancer Sister          dx 20s; ? colon cancer   Breast cancer Maternal Aunt          dx after 27   Cancer Maternal Uncle          ? behind ear   Cancer Maternal Grandfather          mouth   Breast cancer Paternal Grandmother          d. 75s   Cancer Paternal Grandfather          unknown type   Breast cancer Cousin          d. 33     Megan Pham's sister had genetic testing through [employer] in 2018 for the Common Hereditary Cancers Panel.  A variant of uncertain significance was detected in her testing at [facility]  X.381+8E>X (Splice donor), which was reclassified to likely pathogenic in June 2023.    Megan Pham is unaware of other previous family history of genetic testing for hereditary cancer risks.  There is no reported Ashkenazi Jewish ancestry. There is no known consanguinity.     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  This is the same likely pathogenic variant previously detected in her sister.   No other pathogenic variants were detected in the [facility] Common Hereditary  Cancers +RNA Panel.  The Common Hereditary Cancers + RNA Panel offered by [facility] includes sequencing, deletion/duplication, and RNA testing of the following 47 genes: APC, ATM, AXIN2, BARD1, BMPR1A, BRCA1, BRCA2, BRIP1, CDH1, CDK4*, CDKN2A (p14ARF)*, CDKN2A (p16INK4a)*, CHEK2, CTNNA1, DICER1, EPCAM (Deletion/duplication testing only), GREM1 (promoter region deletion/duplication testing only), KIT, MEN1, MLH1, MSH2, MSH3, MSH6, MUTYH, NBN, NF1, NHTL1, PALB2, PDGFRA*, PMS2, POLD1, POLE, PTEN, RAD50, RAD51C, RAD51D, SDHB, SDHC, SDHD, SMAD4, SMARCA4. STK11, TP53, TSC1, TSC2, and VHL.  The following genes were evaluated for sequence changes only: SDHA and HOXB13 c.251G>A variant only.  RNA analysis is not performed for the * genes.    The test report will be scanned into EPIC and located under the Molecular Pathology section of the Results Review tab.  A portion of the result report is included below for reference. The  report date is June 12, 2022.     Genetic testing did identify a variant of uncertain significance (VUS) in the BRIP1 gene called c.413T>C (p.Leu138Ser).  At this time, it is unknown if this variant is associated with increased cancer  risk or if this is a normal finding, but most variants such as this get reclassified to being inconsequential. It should not be used to make medical management decisions. With time, we suspect the lab will determine the significance of this variant, if any. If we do learn more about it, we will try to contact Megan Pham to discuss it further. However, it is important to stay in touch with [place] periodically and keep the address and phone number up to date.   CTNNA1: Clinical Condition  Cancers associated with CTNNA1 Increased risk for diffuse gastric cancer, risks not well-defined; estimates are based on a limited number of studies with small cohorts (PMID: 57322025, [id number]) Other conditions associated with CTNNA1 Increased risk for butterfly-shaped  pigmentary macular dystrophy   CTNNA1: Management Recommendations: Gastric cancer screening and risk reduction (International Gastric [place], 2020):  Healthcare providers should consider personalized discussion of management based on the individual and family presentation. Management options may include annual surveillance through endoscopy with targeted and random gastric mucosal biopsies or consideration for a prophylactic total gastrectomy In those without a family history of gastric cancer, surveillance instead of a prophylactic total gastrectomy should be considered.   Butterfly-shaped pigmentary macular dystrophy For individuals presenting with signs or symptoms, ocular evaluation may be considered  Eye exam may include imaging with fundus autoflorescence (FAF), optical coherence tomography (OCT), and optical coherence tomography angiography (OCTA) For individuals without symptoms, ongoing evaluation may be considered and education to help recognize new symptom onset should be provided  This information is based on current understanding of the gene and may change in the future.   Implications for Family Members: Identification of a pathogenic variant allows for the recognition of at-risk relatives who can pursue testing for the familial variant. Hereditary predisposition to cancer due to pathogenic variants in the CTNNA1 gene has autosomal dominant inheritance. This means that an individual with a pathogenic variant has a 50% chance of passing the condition on to his/her offspring.  First degree relatives have a 50% chance of having the same mutation in CTNNA1.  More distant relatives also have an increased chance of having the family mutation.    Family members are encouraged to consider genetic testing for this familial pathogenic variant. They may contact our office at [phone] for more information or to schedule an appointment.  Complimentary testing for the  familial variant is available for 150 days from her report date.  Family members who live outside of the area are encouraged to find a genetic counselor in their area by visiting: [email or web address].   Resources: FORCE (Facing Our Risk of Cancer Empowered) is a resource for those with a hereditary predisposition to develop cancer. FORCE provides information about risk reduction, advocacy, legislation, and clinical trials.  Additionally, FORCE provides a platform for collaboration and support; which includes: peer navigation, message boards, local support groups, a toll-free helpline, research registry and recruitment, advocate training, published medical research, webinars, brochures, mastectomy photos, and more.  For more information, visit www.facingourrisk.org  No Stomach for Cancer is a resource for those impacted by gastric cancer or those that have an increased risk for gastric cancer.  They provide informational and support resources including but not limited to peer navigation, support groups, and webinars.  For more information, visit www.nostomachforcancer.org  Plan Ms. Cranor is currently establishing care with a new PCP, Dr. Anastasia Pall.  She was offered a referral to [facility] and declined.  We recommend that Dr. Melford Aase refer to an  appropriate GI provider to discuss appropriate gastric cancer surveillance based on her genetic testing results, family history, and personal health history.  Note sent to Dr. Melford Aase.  Ms. Holford is established with an ophthalmologist--Dr. Katy Apo.  She had an appointment with him last month and did not report any discussion related to butterfly-shaped macular dystrophy signs/symptoms. She knows to continue to have ophthalmology appointments at least annually.  Note sent to Dr. Prudencio Burly.  Family letter sent to Ms. Derstine to encourage family testing.  Her son lives in in [place].  She previously stated that her other sisters are not  interested in genetic testing.    Her questions were answered.  She is welcome to reach out to genetics in the future with questions, concerns, or regarding CTNNA1 updates.       M. Joette Catching, [place], [facility] Genetic Counselor [email or web address] (P) [phone]

## 2022-06-19 NOTE — Telephone Encounter (Signed)
Revealed CTNNA1 mutation.  Discussed diffuse gastric cancer risk and association with butterfly-shaped macular dystrophy.  Discussed family implications.  See detailed clinic note.

## 2022-11-17 ENCOUNTER — Other Ambulatory Visit: Payer: Self-pay | Admitting: Family Medicine

## 2023-07-03 ENCOUNTER — Ambulatory Visit: Payer: 59 | Attending: Cardiology | Admitting: Cardiology

## 2023-07-03 ENCOUNTER — Encounter: Payer: Self-pay | Admitting: Cardiology

## 2023-07-03 VITALS — BP 148/60 | HR 87 | Resp 16 | Ht 62.0 in | Wt 171.0 lb

## 2023-07-03 DIAGNOSIS — I6522 Occlusion and stenosis of left carotid artery: Secondary | ICD-10-CM

## 2023-07-03 DIAGNOSIS — R0602 Shortness of breath: Secondary | ICD-10-CM | POA: Diagnosis not present

## 2023-07-03 DIAGNOSIS — I10 Essential (primary) hypertension: Secondary | ICD-10-CM

## 2023-07-03 DIAGNOSIS — E785 Hyperlipidemia, unspecified: Secondary | ICD-10-CM

## 2023-07-03 LAB — BASIC METABOLIC PANEL
BUN/Creatinine Ratio: 23 (ref 12–28)
BUN: 15 mg/dL (ref 8–27)
CO2: 24 mmol/L (ref 20–29)
Calcium: 9.8 mg/dL (ref 8.7–10.3)
Chloride: 102 mmol/L (ref 96–106)
Creatinine, Ser: 0.65 mg/dL (ref 0.57–1.00)
Glucose: 90 mg/dL (ref 70–99)
Potassium: 5.2 mmol/L (ref 3.5–5.2)
Sodium: 141 mmol/L (ref 134–144)
eGFR: 91 mL/min/{1.73_m2} (ref >59)

## 2023-07-03 MED ORDER — METOPROLOL TARTRATE 25 MG PO TABS: 25.0000 mg | ORAL_TABLET | Freq: Two times a day (BID) | ORAL | 0 refills | Status: DC

## 2023-07-03 MED ORDER — ROSUVASTATIN CALCIUM 20 MG PO TABS: 20.0000 mg | ORAL_TABLET | Freq: Every day | ORAL | 3 refills | Status: DC

## 2023-07-03 MED ORDER — ASPIRIN 81 MG PO TBEC: 81.0000 mg | DELAYED_RELEASE_TABLET | Freq: Every day | ORAL | Status: DC

## 2023-07-03 NOTE — Progress Notes (Signed)
Cardiology Office Note:    Date:  07/03/2023  NAME:  Megan Pham    MRN: [id number] DOB:  21-Jul-1946   PCP:  Pollyann Samples, MD  Former Cardiology Providers: NA Primary Cardiologist:  Tessa Lerner, DO, [facility] (established care 07/03/2023) Electrophysiologist:  None   Referring MD: Beam, Chales Salmon, MD  Reason of Consult: Shortness of breath, carotid artery disease.   Chief Complaint  Patient presents with   Shortness of Breath   Carotid artery disease    History of Present Illness:    Megan Pham is a 77 y.o. Caucasian female whose past medical history and cardiovascular risk factors includes: Hypertension, dyslipidemia, carotid artery disease, former smoker. She is being seen today for the evaluation of shortness of breath and carotid artery disease at the request of Beam, Chales Salmon, MD.  Patient was referred to the practice for evaluation of shortness of breath and carotid bruit.  Patient is accompanied by her friend Megan Pham, patient provides verbal consent with having her present during today's encounter.  Shortness of breath: Ongoing for the last several months. More noticeable/worsening. Denies orthopnea or PND.  She has minimal lower extremity swelling localized to the ankles. Overall functional capacity is great for her age-she enjoys doing the elliptical as well as the rowing machine followed by an hour of swimming 3 times a week. At times she has noticed reduced functional capacity. She is no longer able to do yard work that she was enjoying before. Confounding factors could be flareup of seasonal allergies given the coughing/sneezing at times.  Carotid artery disease: At her recent PCP visit she was noted to have a carotid bruit and underwent a carotid duplex which notes disease and is now referred to cardiology.  Clinically she denies lightheaded, dizziness, near-syncope or syncopal events.  No history of stroke, no changes in vision.  Current  Medications: Current Meds  Medication Sig   aspirin EC 81 MG tablet Take 1 tablet (81 mg total) by mouth daily. Swallow whole.   Cholecalciferol (VITAMIN D3) 25 MCG (1000 UT) CAPS Take by mouth.   ibuprofen (ADVIL) 200 MG tablet Take 200 mg by mouth every 6 (six) hours as needed.   losartan (COZAAR) 25 MG tablet Take 25 mg by mouth daily.   Multiple Vitamins-Minerals (PRESERVISION AREDS 2 PO) Take by mouth.   Thiamine HCl (VITAMIN B-1) 250 MG tablet Take 250 mg by mouth daily.   [DISCONTINUED] metoprolol tartrate (LOPRESSOR) 25 MG tablet Take 1 tablet (25 mg total) by mouth 2 (two) times daily. Start 1 week prior to Cardiac CT scan. Stop taking after Cardiac CT scan has been completed.   [DISCONTINUED] rosuvastatin (CRESTOR) 20 MG tablet Take 1 tablet (20 mg total) by mouth daily.     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   Dyslipidemia    Family history of breast cancer 05/27/2022   Family history of gastric cancer 05/27/2022   HTN (hypertension)    Osteoarthritis    Osteopenia    Poison ivy    Pseudoptosis, unspecified laterality    Ulcer 1979   ulcerative colitis    Past Surgical History: Past Surgical History:  Procedure Laterality Date   ABDOMINAL HYSTERECTOMY  1973   Partial hysterectomy   APPENDECTOMY  1973   CHOLECYSTECTOMY  2000   COLONOSCOPY  2004   by Dr.James Weissman-had left sided diverticulosis   HAMMER TOE SURGERY  2004   HERNIA REPAIR     TONSILLECTOMY  1970    Social History: Social History   Tobacco Use   Smoking status: Former    Current packs/day: 0.00    Types: Cigarettes    Quit date: 09/09/2006    Years since quitting: 16.8   Smokeless tobacco: Never  Substance Use Topics   Alcohol use: No   Drug use: No    Family History: Family History  Problem Relation Age of Onset    Arthritis Mother    Cancer Mother        lung   Arthritis Father    Cancer Father        throat and lung; d. 90   Breast cancer Sister 16   Other Sister        CTNNA1 mutation   Gastric cancer Sister        dx 79s; ? colon cancer   Arthritis Maternal Aunt    Breast cancer Maternal Aunt        dx after 50   Arthritis Maternal Uncle    Cancer Maternal Uncle        ? behind ear   Arthritis Paternal Aunt    Arthritis Paternal Uncle    Arthritis Maternal Grandmother    Arthritis Maternal Grandfather    Cancer Maternal Grandfather        mouth   Arthritis Paternal Grandmother    Breast cancer Paternal Grandmother        d. 30s   Arthritis Paternal Grandfather    Cancer Paternal Grandfather        unknown type   Arthritis Son    Healthy Grandchild        x 2   Breast cancer Cousin        d. 44   Colon cancer Neg Hx     ROS:   Review of Systems  Constitutional: Positive for malaise/fatigue.  Cardiovascular:  Positive for dyspnea on exertion and leg swelling. Negative for chest pain, claudication, irregular heartbeat, near-syncope, orthopnea, palpitations, paroxysmal nocturnal dyspnea and syncope.  Respiratory:  Negative for shortness of breath.   Hematologic/Lymphatic: Negative for bleeding problem.  Musculoskeletal:  Negative for muscle cramps and myalgias.  Neurological:  Negative for dizziness and light-headedness.    EKGs/Labs/Other Studies Reviewed:   EKG Interpretation Date/Time:  Thursday July 03 2023 10:16:52 EDT Ventricular Rate:  89 PR Interval:  156 QRS Duration:  78 QT Interval:  356 QTC Calculation: 433 R Axis:   13  Text Interpretation: Normal sinus rhythm Normal ECG When compared with ECG of 19-Nov-1998 13:05, Vent. rate has increased BY  29 BPM Otherwise no significant change Confirmed by Tessa Lerner [phone]) on 07/03/2023 10:23:26 AM    Carotid Duplex  06/23/2023 at [facility] see Care Everywhere.  1.  Prominent soft plaque within the mid left  common carotid artery with associated stenosis at this level and velocity elevation. This is concerning for a clinically significant stenosis.  2.  Proximal left ICA velocity suggesting 50-69% stenosis  3.  No evidence of a hemodynamically significant right-sided carotid stenosis.  4.  Both vertebral arteries are patent with antegrade flow.  5.  Consider carotid CTA scan for further evaluation of these findings.   Labs:    Latest Ref Rng & Units 11/01/2021    9:39 AM 03/22/2020    8:28  AM 12/15/2017    8:45 AM  CBC  WBC 4.0 - 10.5 K/uL 5.9  6.6  5.9   Hemoglobin 12.0 - 15.0 g/dL 40.9  81.1  91.4   Hematocrit 36.0 - 46.0 % 43.2  40.6  42.3   Platelets 150.0 - 400.0 K/uL 332.0  308.0  323.0        Latest Ref Rng & Units 11/01/2021    9:39 AM 03/22/2020    8:28 AM 12/15/2017    8:45 AM  BMP  Glucose 70 - 99 mg/dL 782  956  96   BUN 6 - 23 mg/dL 19  12  16    Creatinine 0.40 - 1.20 mg/dL 2.13  0.86  5.78   Sodium 135 - 145 mEq/L 141  139  140   Potassium 3.5 - 5.1 mEq/L 5.3  4.5  4.7   Chloride 96 - 112 mEq/L 103  102  102   CO2 19 - 32 mEq/L 31  27  28    Calcium 8.4 - 10.5 mg/dL 9.9  9.7  9.6       Latest Ref Rng & Units 11/01/2021    9:39 AM 03/22/2020    8:28 AM 12/15/2017    8:45 AM  CMP  Glucose 70 - 99 mg/dL 469  629  96   BUN 6 - 23 mg/dL 19  12  16    Creatinine 0.40 - 1.20 mg/dL 5.28  4.13  2.44   Sodium 135 - 145 mEq/L 141  139  140   Potassium 3.5 - 5.1 mEq/L 5.3  4.5  4.7   Chloride 96 - 112 mEq/L 103  102  102   CO2 19 - 32 mEq/L 31  27  28    Calcium 8.4 - 10.5 mg/dL 9.9  9.7  9.6   Total Protein 6.0 - 8.3 g/dL 7.7  7.5  7.7   Total Bilirubin 0.2 - 1.2 mg/dL 0.6  0.5  0.4   Alkaline Phos 39 - 117 U/L 101  101  96   AST 0 - 37 U/L 21  24  22    ALT 0 - 35 U/L 18  27  23      Lab Results  Component Value Date   CHOL 242 (H) 11/01/2021   HDL 64.00 11/01/2021   LDLCALC 154 (H) 11/01/2021   LDLDIRECT 187.4 10/14/2013   TRIG 121.0 11/01/2021   CHOLHDL 4 11/01/2021    No results for input(s): "LIPOA" in the last 8760 hours. No components found for: "NTPROBNP" No results for input(s): "PROBNP" in the last 8760 hours. No results for input(s): "TSH" in the last 8760 hours.    Physical Exam:    Today's Vitals   07/03/23 1014  BP: (!) 148/60  Pulse: 87  Resp: 16  SpO2: 97%  Weight: 171 lb (77.6 kg)  Height: 5\' 2"  (1.575 m)   Body mass index is 31.28 kg/m. Wt Readings from Last 3 Encounters:  07/03/23 171 lb (77.6 kg)  06/11/22 171 lb 3.2 oz (77.7 kg)  11/01/21 170 lb 3.2 oz (77.2 kg)    Physical Exam  Constitutional: No distress.  hemodynamically stable  Neck: No JVD present.  Cardiovascular: Normal rate, regular rhythm, S1 normal, S2 normal, intact distal pulses and normal pulses. Exam reveals no gallop, no S3 and no S4.  No murmur heard. Pulses:      Carotid pulses are  on the left side with bruit. Pulmonary/Chest: Effort normal and breath sounds normal. No stridor. She has  no wheezes. She has no rales.  Abdominal: Soft. Bowel sounds are normal. She exhibits no distension. There is no abdominal tenderness.  Musculoskeletal:        General: No edema.     Cervical back: Neck supple.  Neurological: She is alert and oriented to person, place, and time. She has intact cranial nerves (2-12).  Skin: Skin is warm and moist.   Impression & Recommendation(s):  Impression:   ICD-10-CM   1. Shortness of breath  R06.02 EKG 12-Lead    CT CORONARY MORPH W/CTA COR W/SCORE W/CA W/CM &/OR WO/CM    ECHOCARDIOGRAM COMPLETE    2. Stenosis of left carotid artery  I65.22 aspirin EC 81 MG tablet    CT ANGIO HEAD NECK W WO CM    rosuvastatin (CRESTOR) 20 MG tablet    3. Benign hypertension  I10 Basic metabolic panel    4. Dyslipidemia  E78.5 LDL cholesterol, direct    Comprehensive metabolic panel    Lipid panel       Recommendation(s):  Shortness of breath Multifactorial. More noticeable and progressive. Would like to rule out underlying  ischemic substrate. Echo will be ordered to evaluate for structural heart disease and left ventricular systolic function. Coronary CTA to evaluate for obstructive disease/CAC I have also asked her to keep a log of her blood pressures to see if this is also contributing. Start Lopressor 25 mg p.o. twice daily a week prior to her coronary CTA. Allergy management per primary team  Stenosis of left carotid artery: Notable left carotid bruit. Underwent carotid duplex at [facility]-results reviewed in Care Everywhere. Referred to cardiology for further evaluation and management. For now she is asymptomatic. Recommend CTA head and neck to evaluate to evaluate for the degree of stenosis. Patient is educated on the importance of being cognizant of strokelike symptoms involving but not limited to either the left eye vision changes or contralateral body. Start aspirin 81 mg p.o. daily. Start Crestor 20 mg p.o. daily Further recommendations to follow  Benign hypertension Office blood pressures are not at goal. I have asked her to check her blood pressures at home to see if further medication titration is warranted. Reemphasized importance of low-salt diet Recommended goal SBP 130 mmHg. Monitor for now  Dyslipidemia Currently on atorvastatin.   She denies myalgia or other side effects. Most recent lipids dated February 2023, independently reviewed as noted above.  LDL calculated 154 mg/dL as of 01/7845. She does not know why her statins were discontinued in the past.   Orders Placed:  Orders Placed This Encounter  Procedures   CT CORONARY MORPH W/CTA COR W/SCORE W/CA W/CM &/OR WO/CM    Standing Status:   Future    Standing Expiration Date:   07/02/2024    Order Specific Question:   If indicated for the ordered procedure, I authorize the administration of contrast media per Radiology protocol    Answer:   Yes    Order Specific Question:   Initiate Coronary CTA Adult Protocol    Answer:   Yes     Order Specific Question:   If indicated initiate Post Coronary CTA Hypotension Adult Protocol    Answer:   Yes    Order Specific Question:   Does the patient have a contrast media/X-ray dye allergy?    Answer:   No    Order Specific Question:   Authorization:    Answer:   FFR will be ordered if deemed medically necessary   CT ANGIO HEAD  NECK W WO CM    Standing Status:   Future    Standing Expiration Date:   07/02/2024    Scheduling Instructions:     Must be 1 week after coronary CTA    Order Specific Question:   If indicated for the ordered procedure, I authorize the administration of contrast media per Radiology protocol    Answer:   Yes    Order Specific Question:   Does the patient have a contrast media/X-ray dye allergy?    Answer:   No    Order Specific Question:   Preferred imaging location?    Answer:   [facility]   Basic metabolic panel    Order Specific Question:   Has the patient fasted?    Answer:   Yes   LDL cholesterol, direct    Standing Status:   Future    Standing Expiration Date:   07/02/2024   Comprehensive metabolic panel    Standing Status:   Future    Standing Expiration Date:   07/02/2024    Order Specific Question:   Has the patient fasted?    Answer:   Yes   Lipid panel    Standing Status:   Future    Standing Expiration Date:   07/02/2024    Order Specific Question:   Has the patient fasted?    Answer:   Yes   EKG 12-Lead   ECHOCARDIOGRAM COMPLETE    Standing Status:   Future    Standing Expiration Date:   07/02/2024    Order Specific Question:   Where should this test be performed    Answer:   [facility])    Order Specific Question:   Does the patient weigh less than or greater than 250 lbs?    Answer:   Patient weighs less than 250 lbs    Order Specific Question:   Perflutren DEFINITY (image enhancing agent) should be administered unless hypersensitivity or allergy exist    Answer:   Administer Perflutren    Order  Specific Question:   Reason for exam-Echo    Answer:   Dyspnea  R06.00    As part of medical decision making results of the notes provided by referring physician, carotid duplex results from Care Everywhere, lipids from February 2023, were reviewed independently at today's visit.   Final Medication List:    Meds ordered this encounter  Medications   DISCONTD: metoprolol tartrate (LOPRESSOR) 25 MG tablet    Sig: Take 1 tablet (25 mg total) by mouth 2 (two) times daily. Start 1 week prior to Cardiac CT scan. Stop taking after Cardiac CT scan has been completed.    Dispense:  28 tablet    Refill:  0   DISCONTD: rosuvastatin (CRESTOR) 20 MG tablet    Sig: Take 1 tablet (20 mg total) by mouth daily.    Dispense:  90 tablet    Refill:  3   aspirin EC 81 MG tablet    Sig: Take 1 tablet (81 mg total) by mouth daily. Swallow whole.   metoprolol tartrate (LOPRESSOR) 25 MG tablet    Sig: Take 1 tablet (25 mg total) by mouth 2 (two) times daily. Start 1 week prior to Cardiac CT scan. Stop taking after Cardiac CT scan has been completed.    Dispense:  28 tablet    Refill:  0   rosuvastatin (CRESTOR) 20 MG tablet    Sig: Take 1 tablet (20 mg total) by  mouth daily.    Dispense:  90 tablet    Refill:  3     Current Outpatient Medications:    aspirin EC 81 MG tablet, Take 1 tablet (81 mg total) by mouth daily. Swallow whole., Disp: , Rfl:    Cholecalciferol (VITAMIN D3) 25 MCG (1000 UT) CAPS, Take by mouth., Disp: , Rfl:    ibuprofen (ADVIL) 200 MG tablet, Take 200 mg by mouth every 6 (six) hours as needed., Disp: , Rfl:    losartan (COZAAR) 25 MG tablet, Take 25 mg by mouth daily., Disp: , Rfl:    Multiple Vitamins-Minerals (PRESERVISION AREDS 2 PO), Take by mouth., Disp: , Rfl:    Thiamine HCl (VITAMIN B-1) 250 MG tablet, Take 250 mg by mouth daily., Disp: , Rfl:    metoprolol tartrate (LOPRESSOR) 25 MG tablet, Take 1 tablet (25 mg total) by mouth 2 (two) times daily. Start 1 week prior to  Cardiac CT scan. Stop taking after Cardiac CT scan has been completed., Disp: 28 tablet, Rfl: 0   rosuvastatin (CRESTOR) 20 MG tablet, Take 1 tablet (20 mg total) by mouth daily., Disp: 90 tablet, Rfl: 3  Consent:   NA  Disposition:   In 7 weeks to discuss test results and reevaluate dyspnea and carotid disease management Patient may be asked to follow-up sooner based on the results of the above-mentioned testing.  Her questions and concerns were addressed to her satisfaction. She voices understanding of the recommendations provided during this encounter.    Signed, Tessa Lerner, DO, [facility]  [facility]  [address] [place], [place] [address] 07/03/2023 12:58 PM

## 2023-07-03 NOTE — Patient Instructions (Addendum)
Medication Instructions:  Your physician has recommended you make the following change in your medication:   START Aspirin 81 mg once daily   START Rosuvastatin (Crestor) 20 mg once daily   START Metoprolol 25 mg twice daily  1 WEEK prior to Cardiac CT. Then STOP taking it once the Cardiac CT is completed.   *If you need a refill on your cardiac medications before your next appointment, please call your pharmacy*  Lab Work: TODAY BMP FASTING lipid panel, direct LDL, and CMP in 6 weeks  If you have labs (blood work) drawn today and your tests are completely normal, you will receive your results only by: MyChart Message (if you have MyChart) OR A paper copy in the mail If you have any lab test that is abnormal or we need to change your treatment, we will call you to review the results.  Testing/Procedures: Your physician has requested that you have an echocardiogram. Echocardiography is a painless test that uses sound waves to create images of your heart. It provides your doctor with information about the size and shape of your heart and how well your heart's chambers and valves are working. This procedure takes approximately one hour. There are no restrictions for this procedure. Please do NOT wear cologne, perfume, aftershave, or lotions (deodorant is allowed). Please arrive 15 minutes prior to your appointment time.  Your physician has requested that you have cardiac CT. Cardiac computed tomography (CT) is a painless test that uses an x-ray machine to take clear, detailed pictures of your heart. For further information please visit [email or web address] Please follow instruction sheet as given.  Cardiac CT Angiography (CTA), is a special type of CT scan that uses a computer to produce multi-dimensional views of major blood vessels throughout the body. In CT angiography, a contrast material is injected through an IV to help visualize the blood vessels   Follow-Up: At [facility], you  and your health needs are our priority.  As part of our continuing mission to provide you with exceptional heart care, we have created designated Provider Care Teams.  These Care Teams include your primary Cardiologist (physician) and Advanced Practice Providers (APPs -  Physician Assistants and Nurse Practitioners) who all work together to provide you with the care you need, when you need it.   Your next appointment:   7 week(s)  The format for your next appointment:   In Person  Provider:   Tessa Lerner, DO {  Other Instructions   Your cardiac CT will be scheduled at one of the below locations:   [facility] [address] [place], [place] [address] [phone]  If scheduled at [facility], please arrive at the [facility] and Children's Entrance (Entrance C2) of [facility] 30 minutes prior to test start time. You can use the FREE valet parking offered at entrance C (encouraged to control the heart rate for the test)  Proceed to the [facility] Radiology Department (first floor) to check-in and test prep.  All radiology patients and guests should use entrance C2 at [facility], accessed from [facility], even though the hospital's physical address listed is [address]     Please follow these instructions carefully (unless otherwise directed):  An IV will be required for this test and Nitroglycerin will be given.  Hold all erectile dysfunction medications at least 3 days (72 hrs) prior to test. (Ie viagra, cialis, sildenafil, tadalafil, etc)   On the Night Before  the Test: Be sure to Drink plenty of water. Do not consume any caffeinated/decaffeinated beverages or chocolate 12 hours prior to your test. Do not take any antihistamines 12 hours prior to your test.  On the Day of the Test: Drink plenty of water until 1 hour prior to the test. Do not eat any food 1 hour prior to test. You may take your regular  medications prior to the test.  Take metoprolol (Lopressor) two hours prior to test. FEMALES- please wear underwire-free bra if available, avoid dresses & tight clothing  After the Test: Drink plenty of water. After receiving IV contrast, you may experience a mild flushed feeling. This is normal. On occasion, you may experience a mild rash up to 24 hours after the test. This is not dangerous. If this occurs, you can take Benadryl 25 mg and increase your fluid intake. If you experience trouble breathing, this can be serious. If it is severe call 911 IMMEDIATELY. If it is mild, please call our office. If you take any of these medications: Glipizide/Metformin, Avandament, Glucavance, please do not take 48 hours after completing test unless otherwise instructed.  We will call to schedule your test 2-4 weeks out understanding that some insurance companies will need an authorization prior to the service being performed.   For more information and frequently asked questions, please visit our website : [email or web address]  For non-scheduling related questions, please contact the cardiac imaging nurse navigator should you have any questions/concerns: Cardiac Imaging Nurse Navigators Direct Office Dial: [phone]   For scheduling needs, including cancellations and rescheduling, please call [place], [phone].

## 2023-07-18 ENCOUNTER — Ambulatory Visit (HOSPITAL_COMMUNITY): Payer: 59

## 2023-07-28 ENCOUNTER — Encounter (HOSPITAL_COMMUNITY): Payer: Self-pay

## 2023-07-29 ENCOUNTER — Telehealth (HOSPITAL_COMMUNITY): Payer: Self-pay | Admitting: *Deleted

## 2023-07-29 NOTE — Telephone Encounter (Signed)
Reaching out to patient to offer assistance regarding upcoming cardiac imaging study; pt verbalizes understanding of appt date/time, parking situation and where to check in, pre-test NPO status and medications ordered, and verified current allergies; name and call back number provided for further questions should they arise Hayley Sharpe RN Navigator Cardiac Imaging [facility] [phone] office [phone] cell  

## 2023-07-30 ENCOUNTER — Ambulatory Visit (HOSPITAL_COMMUNITY)
Admission: RE | Admit: 2023-07-30 | Discharge: 2023-07-30 | Disposition: A | Discharge status: Home / Self Care | Payer: 59 | Source: Ambulatory Visit | Attending: Cardiology | Admitting: Cardiology

## 2023-07-30 ENCOUNTER — Ambulatory Visit (HOSPITAL_BASED_OUTPATIENT_CLINIC_OR_DEPARTMENT_OTHER)
Admission: RE | Admit: 2023-07-30 | Discharge: 2023-07-30 | Disposition: A | Discharge status: Home / Self Care | Payer: 59 | Source: Ambulatory Visit | Attending: Cardiology

## 2023-07-30 ENCOUNTER — Other Ambulatory Visit: Payer: Self-pay | Admitting: Cardiology

## 2023-07-30 DIAGNOSIS — I2510 Atherosclerotic heart disease of native coronary artery without angina pectoris: Secondary | ICD-10-CM | POA: Insufficient documentation

## 2023-07-30 DIAGNOSIS — R931 Abnormal findings on diagnostic imaging of heart and coronary circulation: Secondary | ICD-10-CM | POA: Insufficient documentation

## 2023-07-30 DIAGNOSIS — R0602 Shortness of breath: Secondary | ICD-10-CM | POA: Insufficient documentation

## 2023-07-30 MED ORDER — IOHEXOL 350 MG/ML SOLN
95.0000 mL | Freq: Once | INTRAVENOUS | Status: AC | PRN
  Administered 2023-07-30: 95 mL via INTRAVENOUS

## 2023-07-30 MED ORDER — DIPHENHYDRAMINE HCL 25 MG PO CAPS
ORAL_CAPSULE | ORAL | Status: AC
  Filled 2023-07-30: qty 2

## 2023-07-30 MED ORDER — NITROGLYCERIN 0.4 MG SL SUBL
0.8000 mg | SUBLINGUAL_TABLET | Freq: Once | SUBLINGUAL | Status: AC
  Administered 2023-07-30: 0.8 mg via SUBLINGUAL

## 2023-07-30 MED ORDER — NITROGLYCERIN 0.4 MG SL SUBL
SUBLINGUAL_TABLET | SUBLINGUAL | Status: AC
  Filled 2023-07-30: qty 2

## 2023-07-30 NOTE — Progress Notes (Signed)
Called to CT to eval pt post CT heart cath for possible contrast reaction. Pt reports at end of exam, felt some itching on her forehead. Apparent two small areas of erythema/hives present, but seems to be improving from a few minutes ago. Pt denies SOB, wheezing, tongue swelling. No rash elsewhere, feels fine otherwise.  Probable mild contrast reaction. No need for medication here. Advised pt may take Benadryl at home. Contact provider if worsening rash/sxs.  Brayton El PA-C Interventional Radiology 07/30/2023 8:21 AM

## 2023-07-30 NOTE — Progress Notes (Signed)
Patient ID: Megan Pham, female   DOB: 1945/09/17, 77 y.o.   MRN: [id number] CT heart finished pt complained of itching on face. Two hives noted on forehead. No hives noted anywhere else. INterventional radiology pa called. Pt seen by Corliss Marcus PA stated mild reaction to Contrast. Pt to take Benadryl when she gets home.VSS no Shortness of breath no tongue swelling pt states she feels ok. Discharged home

## 2023-07-31 ENCOUNTER — Ambulatory Visit (HOSPITAL_COMMUNITY): Payer: 59 | Attending: Cardiology

## 2023-07-31 DIAGNOSIS — R0602 Shortness of breath: Secondary | ICD-10-CM | POA: Diagnosis present

## 2023-07-31 LAB — ECHOCARDIOGRAM COMPLETE
Area-P 1/2: 1.36 cm2
S' Lateral: 2.4 cm

## 2023-07-31 MED ORDER — PERFLUTREN LIPID MICROSPHERE
1.0000 mL | INTRAVENOUS | Status: AC | PRN
  Administered 2023-07-31: 2 mL via INTRAVENOUS

## 2023-08-21 ENCOUNTER — Encounter: Payer: Self-pay | Admitting: Cardiology

## 2023-08-21 ENCOUNTER — Other Ambulatory Visit: Payer: Self-pay

## 2023-08-21 ENCOUNTER — Other Ambulatory Visit: Payer: Self-pay | Admitting: Cardiology

## 2023-08-21 ENCOUNTER — Ambulatory Visit: Payer: 59 | Attending: Cardiology | Admitting: Cardiology

## 2023-08-21 VITALS — BP 140/78 | HR 72 | Resp 16 | Wt 170.4 lb

## 2023-08-21 DIAGNOSIS — I6522 Occlusion and stenosis of left carotid artery: Secondary | ICD-10-CM

## 2023-08-21 DIAGNOSIS — R0602 Shortness of breath: Secondary | ICD-10-CM

## 2023-08-21 DIAGNOSIS — E785 Hyperlipidemia, unspecified: Secondary | ICD-10-CM

## 2023-08-21 DIAGNOSIS — I25118 Atherosclerotic heart disease of native coronary artery with other forms of angina pectoris: Secondary | ICD-10-CM | POA: Diagnosis not present

## 2023-08-21 DIAGNOSIS — Z01812 Encounter for preprocedural laboratory examination: Secondary | ICD-10-CM

## 2023-08-21 DIAGNOSIS — R931 Abnormal findings on diagnostic imaging of heart and coronary circulation: Secondary | ICD-10-CM | POA: Diagnosis not present

## 2023-08-21 DIAGNOSIS — I10 Essential (primary) hypertension: Secondary | ICD-10-CM

## 2023-08-21 LAB — CBC
Hematocrit: 40 % (ref 34.0–46.6)
Hemoglobin: 13.1 g/dL (ref 11.1–15.9)
MCH: 29.2 pg (ref 26.6–33.0)
MCHC: 32.8 g/dL (ref 31.5–35.7)
MCV: 89 fL (ref 79–97)
Platelets: 309 10*3/uL (ref 150–450)
RBC: 4.48 x10E6/uL (ref 3.77–5.28)
RDW: 11.4 % — ABNORMAL LOW (ref 11.7–15.4)
WBC: 6.9 10*3/uL (ref 3.4–10.8)

## 2023-08-21 LAB — BASIC METABOLIC PANEL
BUN/Creatinine Ratio: 17 (ref 12–28)
BUN: 12 mg/dL (ref 8–27)
CO2: 23 mmol/L (ref 20–29)
Calcium: 9.6 mg/dL (ref 8.7–10.3)
Chloride: 102 mmol/L (ref 96–106)
Creatinine, Ser: 0.72 mg/dL (ref 0.57–1.00)
Glucose: 118 mg/dL — ABNORMAL HIGH (ref 70–99)
Potassium: 4.5 mmol/L (ref 3.5–5.2)
Sodium: 141 mmol/L (ref 134–144)
eGFR: 86 mL/min/{1.73_m2} (ref >59)

## 2023-08-21 MED ORDER — AMLODIPINE BESYLATE 5 MG PO TABS: 5.0000 mg | ORAL_TABLET | Freq: Every day | ORAL | 3 refills | Status: DC

## 2023-08-21 MED ORDER — PREDNISONE & DIPHENHYDRAMINE 3 X 50 MG & 1 X 50 MG PO KIT: PACK | ORAL | 0 refills | Status: DC

## 2023-08-21 MED ORDER — ROSUVASTATIN CALCIUM 40 MG PO TABS: 40.0000 mg | ORAL_TABLET | Freq: Every day | ORAL | 3 refills | Status: DC

## 2023-08-21 NOTE — Progress Notes (Signed)
Allergy protocol ordered d/t pt having contrast allergy. Pt is aware.

## 2023-08-21 NOTE — H&P (View-Only) (Signed)
 Cardiology Office Note:    Date:  08/21/2023  NAME:  Megan Pham    MRN: [id number] DOB:  26-Jun-1946   PCP:  Pollyann Samples, MD  Former Cardiology Providers: NA Primary Cardiologist:  Tessa Lerner, DO, [facility] (established care 07/03/2023) Electrophysiologist:  None   Referring MD: Beam, Chales Salmon, MD  Reason of Consult: Shortness of breath, carotid artery disease.   Chief Complaint  Patient presents with   Follow-up    Shortness of breath and review test results    History of Present Illness:    Megan Pham is a 77 y.o. Caucasian female whose past medical history and cardiovascular risk factors includes: Severe coronary artery calcification, coronary artery disease per CCTA, hypertension, dyslipidemia, carotid artery disease, former smoker.   At the last office visit patient endorsed having shortness of breath which is getting progressively worse.  Shared decision was to proceed with echocardiogram and coronary CTA to rule out ischemic substrate.  Echocardiogram notes preserved LVEF without any significant valvular heart disease.  However, coronary CTA notes disease in the LAD, LCx, and RCA distribution.  Distal RCA and OM 3 were noted to be CTO on her recent CCTA.  Coronary CTA also noted severe CAC and majority of the plaque volume is noncalcified.  In addition, given her carotid disease patient was recommended to have a CTA head and neck but this is still pending.  Aspirin and statin therapy were initiated at the last office visit.  Patient presents today for follow-up.  Patient states that her shortness of breath is improving but at times she still has dyspnea with very short distances such as going to the mailbox and coming back.  Home blood pressures are elevated, but thinks that her blood pressure cuff is not working.  She continues to exercise by going to the gym 3 times a week and does elliptical and rowing machine followed by 1 hour of pool exercises.  She was given  metoprolol for the coronary CTA but states that she feels exhausted and tired when taking it.  She would like to stop  Current Medications: Current Meds  Medication Sig   amLODipine (NORVASC) 5 MG tablet Take 1 tablet (5 mg total) by mouth daily.   aspirin EC 81 MG tablet Take 1 tablet (81 mg total) by mouth daily. Swallow whole.   Cholecalciferol (VITAMIN D3) 25 MCG (1000 UT) CAPS Take by mouth.   ibuprofen (ADVIL) 200 MG tablet Take 200 mg by mouth every 6 (six) hours as needed.   losartan (COZAAR) 25 MG tablet Take 25 mg by mouth daily.   Multiple Vitamins-Minerals (PRESERVISION AREDS 2 PO) Take by mouth.   Thiamine HCl (VITAMIN B-1) 250 MG tablet Take 250 mg by mouth daily.   [DISCONTINUED] rosuvastatin (CRESTOR) 20 MG tablet Take 1 tablet (20 mg total) by mouth daily.     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   Dyslipidemia    Family history of breast cancer 05/27/2022   Family history of gastric cancer 05/27/2022   HTN (hypertension)    Hyperlipidemia    Osteoarthritis    Osteopenia    Poison ivy    Pseudoptosis, unspecified laterality  Ulcer 1979   ulcerative colitis    Past Surgical History: Past Surgical History:  Procedure Laterality Date   ABDOMINAL HYSTERECTOMY  1973   Partial hysterectomy   APPENDECTOMY  1973   CHOLECYSTECTOMY  2000   COLONOSCOPY  2004   by Dr.James Weissman-had left sided diverticulosis   HAMMER TOE SURGERY  2004   HERNIA REPAIR     TONSILLECTOMY  1970    Social History: Social History   Tobacco Use   Smoking status: Former    Current packs/day: 0.00    Types: Cigarettes    Quit date: 09/09/2006    Years since quitting: 16.9   Smokeless tobacco: Never  Substance Use Topics    Alcohol use: No   Drug use: No    Family History: Family History  Problem Relation Age of Onset   Arthritis Mother    Cancer Mother        lung   Arthritis Father    Cancer Father        throat and lung; d. 82   Breast cancer Sister 90   Other Sister        CTNNA1 mutation   Gastric cancer Sister        dx 15s; ? colon cancer   Arthritis Maternal Aunt    Breast cancer Maternal Aunt        dx after 50   Arthritis Maternal Uncle    Cancer Maternal Uncle        ? behind ear   Arthritis Paternal Aunt    Arthritis Paternal Uncle    Arthritis Maternal Grandmother    Arthritis Maternal Grandfather    Cancer Maternal Grandfather        mouth   Arthritis Paternal Grandmother    Breast cancer Paternal Grandmother        d. 30s   Arthritis Paternal Grandfather    Cancer Paternal Grandfather        unknown type   Arthritis Son    Healthy Grandchild        x 2   Breast cancer Cousin        d. 43   Colon cancer Neg Hx     ROS:   Review of Systems  Constitutional: Positive for malaise/fatigue.  Cardiovascular:  Positive for dyspnea on exertion. Negative for chest pain, claudication, irregular heartbeat, leg swelling, near-syncope, orthopnea, palpitations, paroxysmal nocturnal dyspnea and syncope.  Respiratory:  Negative for shortness of breath.   Hematologic/Lymphatic: Negative for bleeding problem.  Musculoskeletal:  Negative for muscle cramps and myalgias.  Neurological:  Negative for dizziness and light-headedness.    EKGs/Labs/Other Studies Reviewed:        Echo: November 21st 2024 LVEF: 60-65% Diastolic Function: Grade 1 No significant valvular heart disease. Ascending aorta 39 mm Estimated RAP 3 mmHg   CCTA 07/30/2023 1. Significant diffuse CAD with CTO noted, CADRADS = 5. CT FFR will be performed and reported separately. Diffuse moderate disease in LAD and LCx, with OM3 modeled as CTO. RCA with 50-69% stenosis in proximal and mid vessel, and 70-99%  stenosis in distal vessel. Distal RCA appears CTO.  2. Coronary calcium score of 1364. This was 95th percentile for age-, sex-, and race- matched controls.  3. Total plaque volume 974 mm3 which is 86th percentile for age- and sex- matched controls (calcified plaque 220 mm3; noncalcified plaque 754 mm3). Total plaque volume is extensive.  4. Normal coronary origin with right dominance.  INTERPRETATION:  CAD-RADS 5: Total  coronary occlusion (100%). Consider cardiac catheterization or viability assessment. Consider symptom-guided anti-ischemic pharmacotherapy as well as risk factor modification per guideline directed care.  Radiology Overread: Changes consistent with prior granulomatous disease.   CT-FFR: Nov 2024 1. CT FFR analysis did not show any significant focal stenosis, but distal RCA and OM3 modeled as CTO. Distal LAD caliber is too small to be evaluated.  Carotid Duplex  06/23/2023 at [facility] see Care Everywhere.  1.  Prominent soft plaque within the mid left common carotid artery with associated stenosis at this level and velocity elevation. This is concerning for a clinically significant stenosis.  2.  Proximal left ICA velocity suggesting 50-69% stenosis  3.  No evidence of a hemodynamically significant right-sided carotid stenosis.  4.  Both vertebral arteries are patent with antegrade flow.  5.  Consider carotid CTA scan for further evaluation of these findings.   Labs:    Latest Ref Rng & Units 11/01/2021    9:39 AM 03/22/2020    8:28 AM 12/15/2017    8:45 AM  CBC  WBC 4.0 - 10.5 K/uL 5.9  6.6  5.9   Hemoglobin 12.0 - 15.0 g/dL 98.1  19.1  47.8   Hematocrit 36.0 - 46.0 % 43.2  40.6  42.3   Platelets 150.0 - 400.0 K/uL 332.0  308.0  323.0        Latest Ref Rng & Units 07/03/2023   11:26 AM 11/01/2021    9:39 AM 03/22/2020    8:28 AM  BMP  Glucose 70 - 99 mg/dL 90  295  621   BUN 8 - 27 mg/dL 15  19  12    Creatinine 0.57 - 1.00 mg/dL 3.08  6.57  8.46    BUN/Creat Ratio 12 - 28 23     Sodium 134 - 144 mmol/L 141  141  139   Potassium 3.5 - 5.2 mmol/L 5.2  5.3  4.5   Chloride 96 - 106 mmol/L 102  103  102   CO2 20 - 29 mmol/L 24  31  27    Calcium 8.7 - 10.3 mg/dL 9.8  9.9  9.7       Latest Ref Rng & Units 07/03/2023   11:26 AM 11/01/2021    9:39 AM 03/22/2020    8:28 AM  CMP  Glucose 70 - 99 mg/dL 90  962  952   BUN 8 - 27 mg/dL 15  19  12    Creatinine 0.57 - 1.00 mg/dL 8.41  3.24  4.01   Sodium 134 - 144 mmol/L 141  141  139   Potassium 3.5 - 5.2 mmol/L 5.2  5.3  4.5   Chloride 96 - 106 mmol/L 102  103  102   CO2 20 - 29 mmol/L 24  31  27    Calcium 8.7 - 10.3 mg/dL 9.8  9.9  9.7   Total Protein 6.0 - 8.3 g/dL  7.7  7.5   Total Bilirubin 0.2 - 1.2 mg/dL  0.6  0.5   Alkaline Phos 39 - 117 U/L  101  101   AST 0 - 37 U/L  21  24   ALT 0 - 35 U/L  18  27     Lab Results  Component Value Date   CHOL 242 (H) 11/01/2021   HDL 64.00 11/01/2021   LDLCALC 154 (H) 11/01/2021   LDLDIRECT 187.4 10/14/2013   TRIG 121.0 11/01/2021   CHOLHDL 4 11/01/2021   No results for input(s): "LIPOA" in the  last 8760 hours. No components found for: "NTPROBNP" No results for input(s): "PROBNP" in the last 8760 hours. No results for input(s): "TSH" in the last 8760 hours.    Physical Exam:    Today's Vitals   08/21/23 0901  BP: (!) 140/78  Pulse: 72  Resp: 16  SpO2: 97%  Weight: 170 lb 6.4 oz (77.3 kg)   Body mass index is 31.17 kg/m. Wt Readings from Last 3 Encounters:  08/21/23 170 lb 6.4 oz (77.3 kg)  07/03/23 171 lb (77.6 kg)  06/11/22 171 lb 3.2 oz (77.7 kg)    Physical Exam  Constitutional: No distress.  hemodynamically stable  Neck: No JVD present.  Cardiovascular: Normal rate, regular rhythm, S1 normal, S2 normal, intact distal pulses and normal pulses. Exam reveals no gallop, no S3 and no S4.  No murmur heard. Pulses:      Carotid pulses are  on the left side with bruit. Pulmonary/Chest: Effort normal and breath sounds  normal. No stridor. She has no wheezes. She has no rales.  Abdominal: Soft. Bowel sounds are normal. She exhibits no distension. There is no abdominal tenderness.  Musculoskeletal:        General: No edema.     Cervical back: Neck supple.  Neurological: She is alert and oriented to person, place, and time. She has intact cranial nerves (2-12).  Skin: Skin is warm and moist.   Impression & Recommendation(s):  Impression:   ICD-10-CM   1. Atherosclerosis of native coronary artery of native heart with other form of angina pectoris (HCC)  I25.118 amLODipine (NORVASC) 5 MG tablet    2. Agatston coronary artery calcium score greater than 400  R93.1     3. Shortness of breath  R06.02     4. Stenosis of left carotid artery  I65.22 rosuvastatin (CRESTOR) 40 MG tablet    5. Benign hypertension  I10 amLODipine (NORVASC) 5 MG tablet    6. Dyslipidemia  E78.5 rosuvastatin (CRESTOR) 40 MG tablet    7. Pre-procedure lab exam  Z01.812 CBC    Basic metabolic panel    Basic metabolic panel    CBC        Recommendation(s):  Atherosclerosis of native coronary artery of native heart with other form of angina pectoris (HCC) Agatston coronary artery calcium score greater than 400 Shortness of breath Referred to the practice for worsening dyspnea with minimal activity. At the last office visit shared decision was to proceed with ischemic workup. Coronary CTA performed in November 2024 notes a severe CAC with a total score of 1346 and CCTA notes disease in the LAD, LCx, RCA distribution.  OM 3 and distal RCA are moderate to be CTO as per report.  CT FFR did not show any obvious hemodynamically significant stenosis in the vessels that were determined to be CTO or not valuated.  Since last office visit she started on aspirin and statin therapy and has done well. Will increase Crestor to 40 mg p.o. nightly given the degree of CAD, carotid disease, and her total plaque volume is severe majority of which is  noncalcified. Will check fasting lipids in 6 weeks to reevaluate therapy.  We discussed the role of medical therapy with up titration of antianginal therapy versus proceeding forward with left heart catheterization.  Given the fact that the distal RCA and OM 3 are mottled CTO and she is continuing to have symptoms of dyspnea predominantly with effort related activities that she would like to proceed forward with left  heart catheterization.  She understands that medical therapy still is a cornerstone to managing her CAD.   Will discontinue Toprol-XL 25 mg p.o. daily due to her feeling tired and fatigued and worn out. Will start amlodipine 5 mg p.o. daily Informed Consent   Shared Decision Making/Informed Consent The risks [stroke (1 in 1000), death (1 in 1000), kidney failure [usually temporary] (1 in 500), bleeding (1 in 200), allergic reaction [possibly serious] (1 in 200)], benefits (diagnostic support and management of coronary artery disease) and alternatives of a cardiac catheterization were discussed in detail with Ms. Lightsey and she is willing to proceed.     Stenosis of left carotid artery When the reasons she was referred to the practice. Started on antiplatelet therapy and statin therapy in the past. CTA head and neck is still pending. Shared decision was to complete the CAD workup for now.  This will need to be discussed at the next visit.  Benign hypertension Office blood pressures are acceptable but not at goal. She needs to get a new blood pressure cuff at home to see what her ambulatory readings are. Continue current antihypertensive medications and will add amlodipine as noted above.  Dyslipidemia Did well on Crestor 20 mg p.o. daily. Will increase Crestor to 40 mg p.o. nightly given the severe CAC and her total plaque volume. She denies myalgia or other side effects. Most recent lipids dated February 2023, independently reviewed as noted above.  LDL calculated 154 mg/dL as  of 0/8657.  Orders Placed:  Orders Placed This Encounter  Procedures   CBC    Standing Status:   Future    Number of Occurrences:   1    Expected Date:   08/21/2023    Expiration Date:   08/20/2024   Basic metabolic panel    Standing Status:   Future    Number of Occurrences:   1    Expected Date:   08/21/2023    Expiration Date:   08/20/2024   Final Medication List:    Meds ordered this encounter  Medications   rosuvastatin (CRESTOR) 40 MG tablet    Sig: Take 1 tablet (40 mg total) by mouth daily.    Dispense:  90 tablet    Refill:  3    Increase to 40 mg   amLODipine (NORVASC) 5 MG tablet    Sig: Take 1 tablet (5 mg total) by mouth daily.    Dispense:  90 tablet    Refill:  3     Current Outpatient Medications:    amLODipine (NORVASC) 5 MG tablet, Take 1 tablet (5 mg total) by mouth daily., Disp: 90 tablet, Rfl: 3   aspirin EC 81 MG tablet, Take 1 tablet (81 mg total) by mouth daily. Swallow whole., Disp: , Rfl:    Cholecalciferol (VITAMIN D3) 25 MCG (1000 UT) CAPS, Take by mouth., Disp: , Rfl:    ibuprofen (ADVIL) 200 MG tablet, Take 200 mg by mouth every 6 (six) hours as needed., Disp: , Rfl:    losartan (COZAAR) 25 MG tablet, Take 25 mg by mouth daily., Disp: , Rfl:    Multiple Vitamins-Minerals (PRESERVISION AREDS 2 PO), Take by mouth., Disp: , Rfl:    Thiamine HCl (VITAMIN B-1) 250 MG tablet, Take 250 mg by mouth daily., Disp: , Rfl:    rosuvastatin (CRESTOR) 40 MG tablet, Take 1 tablet (40 mg total) by mouth daily., Disp: 90 tablet, Rfl: 3  Consent:   NA  Disposition:   In  7 weeks to discuss test results and reevaluate dyspnea and carotid disease management Patient may be asked to follow-up sooner based on the results of the above-mentioned testing.  Her questions and concerns were addressed to her satisfaction. She voices understanding of the recommendations provided during this encounter.    Signed, Tessa Lerner, DO, [facility]  [facility]   [address] [place], [place] [address] 08/21/2023 11:10 AM

## 2023-08-21 NOTE — Patient Instructions (Signed)
Medication Instructions:  Your physician has recommended you make the following change in your medication:   STOP Metoprolol Tartrate (Lopressor)  START Amlodipine 5 mg once daily in the morning   INCREASE Atorvastatin (Crestor) to 40 mg once daily    *If you need a refill on your cardiac medications before your next appointment, please call your pharmacy*  Lab Work: To be completed today: CBC and BMP  If you have labs (blood work) drawn today and your tests are completely normal, you will receive your results only by: MyChart Message (if you have MyChart) OR A paper copy in the mail If you have any lab test that is abnormal or we need to change your treatment, we will call you to review the results.  Testing/Procedures: Your physician has requested that you have a left heart catherization.   Follow-Up: At [facility], you and your health needs are our priority.  As part of our continuing mission to provide you with exceptional heart care, we have created designated Provider Care Teams.  These Care Teams include your primary Cardiologist (physician) and Advanced Practice Providers (APPs -  Physician Assistants and Nurse Practitioners) who all work together to provide you with the care you need, when you need it.  Your next appointment:   6-8 week(s)  The format for your next appointment:   In Person  Provider:   Tessa Lerner, DO or APP

## 2023-08-21 NOTE — Progress Notes (Signed)
Cardiology Office Note:    Date:  08/21/2023  NAME:  Megan Pham    MRN: [id number] DOB:  26-Jun-1946   PCP:  Pollyann Samples, MD  Former Cardiology Providers: NA Primary Cardiologist:  Tessa Lerner, DO, [facility] (established care 07/03/2023) Electrophysiologist:  None   Referring MD: Beam, Chales Salmon, MD  Reason of Consult: Shortness of breath, carotid artery disease.   Chief Complaint  Patient presents with   Follow-up    Shortness of breath and review test results    History of Present Illness:    Megan Pham is a 77 y.o. Caucasian female whose past medical history and cardiovascular risk factors includes: Severe coronary artery calcification, coronary artery disease per CCTA, hypertension, dyslipidemia, carotid artery disease, former smoker.   At the last office visit patient endorsed having shortness of breath which is getting progressively worse.  Shared decision was to proceed with echocardiogram and coronary CTA to rule out ischemic substrate.  Echocardiogram notes preserved LVEF without any significant valvular heart disease.  However, coronary CTA notes disease in the LAD, LCx, and RCA distribution.  Distal RCA and OM 3 were noted to be CTO on her recent CCTA.  Coronary CTA also noted severe CAC and majority of the plaque volume is noncalcified.  In addition, given her carotid disease patient was recommended to have a CTA head and neck but this is still pending.  Aspirin and statin therapy were initiated at the last office visit.  Patient presents today for follow-up.  Patient states that her shortness of breath is improving but at times she still has dyspnea with very short distances such as going to the mailbox and coming back.  Home blood pressures are elevated, but thinks that her blood pressure cuff is not working.  She continues to exercise by going to the gym 3 times a week and does elliptical and rowing machine followed by 1 hour of pool exercises.  She was given  metoprolol for the coronary CTA but states that she feels exhausted and tired when taking it.  She would like to stop  Current Medications: Current Meds  Medication Sig   amLODipine (NORVASC) 5 MG tablet Take 1 tablet (5 mg total) by mouth daily.   aspirin EC 81 MG tablet Take 1 tablet (81 mg total) by mouth daily. Swallow whole.   Cholecalciferol (VITAMIN D3) 25 MCG (1000 UT) CAPS Take by mouth.   ibuprofen (ADVIL) 200 MG tablet Take 200 mg by mouth every 6 (six) hours as needed.   losartan (COZAAR) 25 MG tablet Take 25 mg by mouth daily.   Multiple Vitamins-Minerals (PRESERVISION AREDS 2 PO) Take by mouth.   Thiamine HCl (VITAMIN B-1) 250 MG tablet Take 250 mg by mouth daily.   [DISCONTINUED] rosuvastatin (CRESTOR) 20 MG tablet Take 1 tablet (20 mg total) by mouth daily.     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   Dyslipidemia    Family history of breast cancer 05/27/2022   Family history of gastric cancer 05/27/2022   HTN (hypertension)    Hyperlipidemia    Osteoarthritis    Osteopenia    Poison ivy    Pseudoptosis, unspecified laterality  Ulcer 1979   ulcerative colitis    Past Surgical History: Past Surgical History:  Procedure Laterality Date   ABDOMINAL HYSTERECTOMY  1973   Partial hysterectomy   APPENDECTOMY  1973   CHOLECYSTECTOMY  2000   COLONOSCOPY  2004   by Dr.James Weissman-had left sided diverticulosis   HAMMER TOE SURGERY  2004   HERNIA REPAIR     TONSILLECTOMY  1970    Social History: Social History   Tobacco Use   Smoking status: Former    Current packs/day: 0.00    Types: Cigarettes    Quit date: 09/09/2006    Years since quitting: 16.9   Smokeless tobacco: Never  Substance Use Topics    Alcohol use: No   Drug use: No    Family History: Family History  Problem Relation Age of Onset   Arthritis Mother    Cancer Mother        lung   Arthritis Father    Cancer Father        throat and lung; d. 82   Breast cancer Sister 90   Other Sister        CTNNA1 mutation   Gastric cancer Sister        dx 15s; ? colon cancer   Arthritis Maternal Aunt    Breast cancer Maternal Aunt        dx after 50   Arthritis Maternal Uncle    Cancer Maternal Uncle        ? behind ear   Arthritis Paternal Aunt    Arthritis Paternal Uncle    Arthritis Maternal Grandmother    Arthritis Maternal Grandfather    Cancer Maternal Grandfather        mouth   Arthritis Paternal Grandmother    Breast cancer Paternal Grandmother        d. 30s   Arthritis Paternal Grandfather    Cancer Paternal Grandfather        unknown type   Arthritis Son    Healthy Grandchild        x 2   Breast cancer Cousin        d. 43   Colon cancer Neg Hx     ROS:   Review of Systems  Constitutional: Positive for malaise/fatigue.  Cardiovascular:  Positive for dyspnea on exertion. Negative for chest pain, claudication, irregular heartbeat, leg swelling, near-syncope, orthopnea, palpitations, paroxysmal nocturnal dyspnea and syncope.  Respiratory:  Negative for shortness of breath.   Hematologic/Lymphatic: Negative for bleeding problem.  Musculoskeletal:  Negative for muscle cramps and myalgias.  Neurological:  Negative for dizziness and light-headedness.    EKGs/Labs/Other Studies Reviewed:        Echo: November 21st 2024 LVEF: 60-65% Diastolic Function: Grade 1 No significant valvular heart disease. Ascending aorta 39 mm Estimated RAP 3 mmHg   CCTA 07/30/2023 1. Significant diffuse CAD with CTO noted, CADRADS = 5. CT FFR will be performed and reported separately. Diffuse moderate disease in LAD and LCx, with OM3 modeled as CTO. RCA with 50-69% stenosis in proximal and mid vessel, and 70-99%  stenosis in distal vessel. Distal RCA appears CTO.  2. Coronary calcium score of 1364. This was 95th percentile for age-, sex-, and race- matched controls.  3. Total plaque volume 974 mm3 which is 86th percentile for age- and sex- matched controls (calcified plaque 220 mm3; noncalcified plaque 754 mm3). Total plaque volume is extensive.  4. Normal coronary origin with right dominance.  INTERPRETATION:  CAD-RADS 5: Total  coronary occlusion (100%). Consider cardiac catheterization or viability assessment. Consider symptom-guided anti-ischemic pharmacotherapy as well as risk factor modification per guideline directed care.  Radiology Overread: Changes consistent with prior granulomatous disease.   CT-FFR: Nov 2024 1. CT FFR analysis did not show any significant focal stenosis, but distal RCA and OM3 modeled as CTO. Distal LAD caliber is too small to be evaluated.  Carotid Duplex  06/23/2023 at [facility] see Care Everywhere.  1.  Prominent soft plaque within the mid left common carotid artery with associated stenosis at this level and velocity elevation. This is concerning for a clinically significant stenosis.  2.  Proximal left ICA velocity suggesting 50-69% stenosis  3.  No evidence of a hemodynamically significant right-sided carotid stenosis.  4.  Both vertebral arteries are patent with antegrade flow.  5.  Consider carotid CTA scan for further evaluation of these findings.   Labs:    Latest Ref Rng & Units 11/01/2021    9:39 AM 03/22/2020    8:28 AM 12/15/2017    8:45 AM  CBC  WBC 4.0 - 10.5 K/uL 5.9  6.6  5.9   Hemoglobin 12.0 - 15.0 g/dL 98.1  19.1  47.8   Hematocrit 36.0 - 46.0 % 43.2  40.6  42.3   Platelets 150.0 - 400.0 K/uL 332.0  308.0  323.0        Latest Ref Rng & Units 07/03/2023   11:26 AM 11/01/2021    9:39 AM 03/22/2020    8:28 AM  BMP  Glucose 70 - 99 mg/dL 90  295  621   BUN 8 - 27 mg/dL 15  19  12    Creatinine 0.57 - 1.00 mg/dL 3.08  6.57  8.46    BUN/Creat Ratio 12 - 28 23     Sodium 134 - 144 mmol/L 141  141  139   Potassium 3.5 - 5.2 mmol/L 5.2  5.3  4.5   Chloride 96 - 106 mmol/L 102  103  102   CO2 20 - 29 mmol/L 24  31  27    Calcium 8.7 - 10.3 mg/dL 9.8  9.9  9.7       Latest Ref Rng & Units 07/03/2023   11:26 AM 11/01/2021    9:39 AM 03/22/2020    8:28 AM  CMP  Glucose 70 - 99 mg/dL 90  962  952   BUN 8 - 27 mg/dL 15  19  12    Creatinine 0.57 - 1.00 mg/dL 8.41  3.24  4.01   Sodium 134 - 144 mmol/L 141  141  139   Potassium 3.5 - 5.2 mmol/L 5.2  5.3  4.5   Chloride 96 - 106 mmol/L 102  103  102   CO2 20 - 29 mmol/L 24  31  27    Calcium 8.7 - 10.3 mg/dL 9.8  9.9  9.7   Total Protein 6.0 - 8.3 g/dL  7.7  7.5   Total Bilirubin 0.2 - 1.2 mg/dL  0.6  0.5   Alkaline Phos 39 - 117 U/L  101  101   AST 0 - 37 U/L  21  24   ALT 0 - 35 U/L  18  27     Lab Results  Component Value Date   CHOL 242 (H) 11/01/2021   HDL 64.00 11/01/2021   LDLCALC 154 (H) 11/01/2021   LDLDIRECT 187.4 10/14/2013   TRIG 121.0 11/01/2021   CHOLHDL 4 11/01/2021   No results for input(s): "LIPOA" in the  last 8760 hours. No components found for: "NTPROBNP" No results for input(s): "PROBNP" in the last 8760 hours. No results for input(s): "TSH" in the last 8760 hours.    Physical Exam:    Today's Vitals   08/21/23 0901  BP: (!) 140/78  Pulse: 72  Resp: 16  SpO2: 97%  Weight: 170 lb 6.4 oz (77.3 kg)   Body mass index is 31.17 kg/m. Wt Readings from Last 3 Encounters:  08/21/23 170 lb 6.4 oz (77.3 kg)  07/03/23 171 lb (77.6 kg)  06/11/22 171 lb 3.2 oz (77.7 kg)    Physical Exam  Constitutional: No distress.  hemodynamically stable  Neck: No JVD present.  Cardiovascular: Normal rate, regular rhythm, S1 normal, S2 normal, intact distal pulses and normal pulses. Exam reveals no gallop, no S3 and no S4.  No murmur heard. Pulses:      Carotid pulses are  on the left side with bruit. Pulmonary/Chest: Effort normal and breath sounds  normal. No stridor. She has no wheezes. She has no rales.  Abdominal: Soft. Bowel sounds are normal. She exhibits no distension. There is no abdominal tenderness.  Musculoskeletal:        General: No edema.     Cervical back: Neck supple.  Neurological: She is alert and oriented to person, place, and time. She has intact cranial nerves (2-12).  Skin: Skin is warm and moist.   Impression & Recommendation(s):  Impression:   ICD-10-CM   1. Atherosclerosis of native coronary artery of native heart with other form of angina pectoris (HCC)  I25.118 amLODipine (NORVASC) 5 MG tablet    2. Agatston coronary artery calcium score greater than 400  R93.1     3. Shortness of breath  R06.02     4. Stenosis of left carotid artery  I65.22 rosuvastatin (CRESTOR) 40 MG tablet    5. Benign hypertension  I10 amLODipine (NORVASC) 5 MG tablet    6. Dyslipidemia  E78.5 rosuvastatin (CRESTOR) 40 MG tablet    7. Pre-procedure lab exam  Z01.812 CBC    Basic metabolic panel    Basic metabolic panel    CBC        Recommendation(s):  Atherosclerosis of native coronary artery of native heart with other form of angina pectoris (HCC) Agatston coronary artery calcium score greater than 400 Shortness of breath Referred to the practice for worsening dyspnea with minimal activity. At the last office visit shared decision was to proceed with ischemic workup. Coronary CTA performed in November 2024 notes a severe CAC with a total score of 1346 and CCTA notes disease in the LAD, LCx, RCA distribution.  OM 3 and distal RCA are moderate to be CTO as per report.  CT FFR did not show any obvious hemodynamically significant stenosis in the vessels that were determined to be CTO or not valuated.  Since last office visit she started on aspirin and statin therapy and has done well. Will increase Crestor to 40 mg p.o. nightly given the degree of CAD, carotid disease, and her total plaque volume is severe majority of which is  noncalcified. Will check fasting lipids in 6 weeks to reevaluate therapy.  We discussed the role of medical therapy with up titration of antianginal therapy versus proceeding forward with left heart catheterization.  Given the fact that the distal RCA and OM 3 are mottled CTO and she is continuing to have symptoms of dyspnea predominantly with effort related activities that she would like to proceed forward with left  heart catheterization.  She understands that medical therapy still is a cornerstone to managing her CAD.   Will discontinue Toprol-XL 25 mg p.o. daily due to her feeling tired and fatigued and worn out. Will start amlodipine 5 mg p.o. daily Informed Consent   Shared Decision Making/Informed Consent The risks [stroke (1 in 1000), death (1 in 1000), kidney failure [usually temporary] (1 in 500), bleeding (1 in 200), allergic reaction [possibly serious] (1 in 200)], benefits (diagnostic support and management of coronary artery disease) and alternatives of a cardiac catheterization were discussed in detail with Ms. Lightsey and she is willing to proceed.     Stenosis of left carotid artery When the reasons she was referred to the practice. Started on antiplatelet therapy and statin therapy in the past. CTA head and neck is still pending. Shared decision was to complete the CAD workup for now.  This will need to be discussed at the next visit.  Benign hypertension Office blood pressures are acceptable but not at goal. She needs to get a new blood pressure cuff at home to see what her ambulatory readings are. Continue current antihypertensive medications and will add amlodipine as noted above.  Dyslipidemia Did well on Crestor 20 mg p.o. daily. Will increase Crestor to 40 mg p.o. nightly given the severe CAC and her total plaque volume. She denies myalgia or other side effects. Most recent lipids dated February 2023, independently reviewed as noted above.  LDL calculated 154 mg/dL as  of 0/8657.  Orders Placed:  Orders Placed This Encounter  Procedures   CBC    Standing Status:   Future    Number of Occurrences:   1    Expected Date:   08/21/2023    Expiration Date:   08/20/2024   Basic metabolic panel    Standing Status:   Future    Number of Occurrences:   1    Expected Date:   08/21/2023    Expiration Date:   08/20/2024   Final Medication List:    Meds ordered this encounter  Medications   rosuvastatin (CRESTOR) 40 MG tablet    Sig: Take 1 tablet (40 mg total) by mouth daily.    Dispense:  90 tablet    Refill:  3    Increase to 40 mg   amLODipine (NORVASC) 5 MG tablet    Sig: Take 1 tablet (5 mg total) by mouth daily.    Dispense:  90 tablet    Refill:  3     Current Outpatient Medications:    amLODipine (NORVASC) 5 MG tablet, Take 1 tablet (5 mg total) by mouth daily., Disp: 90 tablet, Rfl: 3   aspirin EC 81 MG tablet, Take 1 tablet (81 mg total) by mouth daily. Swallow whole., Disp: , Rfl:    Cholecalciferol (VITAMIN D3) 25 MCG (1000 UT) CAPS, Take by mouth., Disp: , Rfl:    ibuprofen (ADVIL) 200 MG tablet, Take 200 mg by mouth every 6 (six) hours as needed., Disp: , Rfl:    losartan (COZAAR) 25 MG tablet, Take 25 mg by mouth daily., Disp: , Rfl:    Multiple Vitamins-Minerals (PRESERVISION AREDS 2 PO), Take by mouth., Disp: , Rfl:    Thiamine HCl (VITAMIN B-1) 250 MG tablet, Take 250 mg by mouth daily., Disp: , Rfl:    rosuvastatin (CRESTOR) 40 MG tablet, Take 1 tablet (40 mg total) by mouth daily., Disp: 90 tablet, Rfl: 3  Consent:   NA  Disposition:   In  7 weeks to discuss test results and reevaluate dyspnea and carotid disease management Patient may be asked to follow-up sooner based on the results of the above-mentioned testing.  Her questions and concerns were addressed to her satisfaction. She voices understanding of the recommendations provided during this encounter.    Signed, Tessa Lerner, DO, [facility]  [facility]   [address] [place], [place] [address] 08/21/2023 11:10 AM

## 2023-08-25 ENCOUNTER — Telehealth: Payer: Self-pay | Admitting: *Deleted

## 2023-08-25 NOTE — Telephone Encounter (Signed)
Cardiac Catheterization scheduled at [facility] for: Tuesday August 26, 2023 9 AM Arrival time [facility] Main Entrance A at: 7 AM  Nothing to eat after midnight prior to procedure, clear liquids until 5 AM day of procedure.  CONTRAST ALLERGY: 13 Prednisone and Benadryl Prep reviewed with patient: 08/25/23 Prednisone 50 mg at 8 PM 08/26/23 Prednisone 50 mg at 2 AM 08/26/23 Prednisone 50 mg and Benadryl 50 mg just prior to leaving home for hospital Pt advised not to drive after taking benadryl  Medication instructions: -Usual morning medications can be taken with sips of water including aspirin 81 mg.  Plan to go home the same day, you will only stay overnight if medically necessary.  You must have responsible adult to drive you home.  Someone must be with you the first 24 hours after you arrive home.  Reviewed procedure instructions with patient.

## 2023-08-26 ENCOUNTER — Encounter (HOSPITAL_COMMUNITY)
Admission: RE | Disposition: A | Discharge status: Home / Self Care | Payer: Self-pay | Source: Home / Self Care | Attending: Cardiovascular Disease

## 2023-08-26 ENCOUNTER — Ambulatory Visit (HOSPITAL_COMMUNITY)
Admission: RE | Admit: 2023-08-26 | Discharge: 2023-08-26 | Disposition: A | Discharge status: Home / Self Care | Payer: 59 | Attending: Cardiovascular Disease | Admitting: Cardiovascular Disease

## 2023-08-26 ENCOUNTER — Other Ambulatory Visit: Payer: Self-pay

## 2023-08-26 DIAGNOSIS — I6522 Occlusion and stenosis of left carotid artery: Secondary | ICD-10-CM | POA: Diagnosis not present

## 2023-08-26 DIAGNOSIS — I2582 Chronic total occlusion of coronary artery: Secondary | ICD-10-CM | POA: Insufficient documentation

## 2023-08-26 DIAGNOSIS — Z87891 Personal history of nicotine dependence: Secondary | ICD-10-CM | POA: Insufficient documentation

## 2023-08-26 DIAGNOSIS — I25118 Atherosclerotic heart disease of native coronary artery with other forms of angina pectoris: Secondary | ICD-10-CM | POA: Diagnosis present

## 2023-08-26 DIAGNOSIS — E785 Hyperlipidemia, unspecified: Secondary | ICD-10-CM | POA: Diagnosis not present

## 2023-08-26 DIAGNOSIS — I10 Essential (primary) hypertension: Secondary | ICD-10-CM | POA: Diagnosis not present

## 2023-08-26 DIAGNOSIS — R0602 Shortness of breath: Secondary | ICD-10-CM | POA: Diagnosis not present

## 2023-08-26 DIAGNOSIS — Z79899 Other long term (current) drug therapy: Secondary | ICD-10-CM | POA: Diagnosis not present

## 2023-08-26 DIAGNOSIS — Z7982 Long term (current) use of aspirin: Secondary | ICD-10-CM | POA: Diagnosis not present

## 2023-08-26 DIAGNOSIS — I25119 Atherosclerotic heart disease of native coronary artery with unspecified angina pectoris: Secondary | ICD-10-CM

## 2023-08-26 HISTORY — PX: LEFT HEART CATH AND CORONARY ANGIOGRAPHY: CATH118249

## 2023-08-26 SURGERY — LEFT HEART CATH AND CORONARY ANGIOGRAPHY
Anesthesia: LOCAL

## 2023-08-26 MED ORDER — SODIUM CHLORIDE 0.9% FLUSH: 3.0000 mL | INTRAVENOUS | Status: DC | PRN

## 2023-08-26 MED ORDER — VERAPAMIL HCL 2.5 MG/ML IV SOLN
INTRAVENOUS | Status: AC
  Filled 2023-08-26: qty 2

## 2023-08-26 MED ORDER — MIDAZOLAM HCL 2 MG/2ML IJ SOLN
INTRAMUSCULAR | Status: AC
Start: 2023-08-26 — End: ?
  Filled 2023-08-26: qty 2

## 2023-08-26 MED ORDER — SODIUM CHLORIDE 0.9% FLUSH: 3.0000 mL | Freq: Two times a day (BID) | INTRAVENOUS | Status: DC

## 2023-08-26 MED ORDER — SODIUM CHLORIDE 0.9 % WEIGHT BASED INFUSION: 1.0000 mL/kg/h | INTRAVENOUS | Status: DC

## 2023-08-26 MED ORDER — SODIUM CHLORIDE 0.9 % WEIGHT BASED INFUSION
3.0000 mL/kg/h | INTRAVENOUS | Status: AC
  Administered 2023-08-26: 3 mL/kg/h via INTRAVENOUS

## 2023-08-26 MED ORDER — LABETALOL HCL 5 MG/ML IV SOLN: 10.0000 mg | INTRAVENOUS | Status: DC | PRN

## 2023-08-26 MED ORDER — HEPARIN (PORCINE) IN NACL 1000-0.9 UT/500ML-% IV SOLN
INTRAVENOUS | Status: DC | PRN
  Administered 2023-08-26 (×2): 500 mL via INTRA_ARTERIAL

## 2023-08-26 MED ORDER — LIDOCAINE HCL (PF) 1 % IJ SOLN
INTRAMUSCULAR | Status: AC
  Filled 2023-08-26: qty 30

## 2023-08-26 MED ORDER — FENTANYL CITRATE (PF) 100 MCG/2ML IJ SOLN
INTRAMUSCULAR | Status: DC | PRN
  Administered 2023-08-26: 25 ug via INTRAVENOUS

## 2023-08-26 MED ORDER — MIDAZOLAM HCL 2 MG/2ML IJ SOLN
INTRAMUSCULAR | Status: DC | PRN
  Administered 2023-08-26: 2 mg via INTRAVENOUS

## 2023-08-26 MED ORDER — ACETAMINOPHEN 325 MG PO TABS: 650.0000 mg | ORAL_TABLET | ORAL | Status: DC | PRN

## 2023-08-26 MED ORDER — ONDANSETRON HCL 4 MG/2ML IJ SOLN
4.0000 mg | Freq: Four times a day (QID) | INTRAMUSCULAR | Status: DC | PRN
Start: 2023-08-26 — End: 2023-08-26

## 2023-08-26 MED ORDER — HEPARIN SODIUM (PORCINE) 1000 UNIT/ML IJ SOLN
INTRAMUSCULAR | Status: AC
  Filled 2023-08-26: qty 10

## 2023-08-26 MED ORDER — LIDOCAINE HCL (PF) 1 % IJ SOLN
INTRAMUSCULAR | Status: DC | PRN
  Administered 2023-08-26: 4 mL

## 2023-08-26 MED ORDER — VERAPAMIL HCL 2.5 MG/ML IV SOLN
INTRAVENOUS | Status: DC | PRN
  Administered 2023-08-26: 10 mL via INTRA_ARTERIAL

## 2023-08-26 MED ORDER — HEPARIN SODIUM (PORCINE) 1000 UNIT/ML IJ SOLN
INTRAMUSCULAR | Status: DC | PRN
  Administered 2023-08-26: 4000 [IU] via INTRAVENOUS

## 2023-08-26 MED ORDER — FENTANYL CITRATE (PF) 100 MCG/2ML IJ SOLN
INTRAMUSCULAR | Status: AC
  Filled 2023-08-26: qty 2

## 2023-08-26 MED ORDER — HYDRALAZINE HCL 20 MG/ML IJ SOLN: 10.0000 mg | INTRAMUSCULAR | Status: DC | PRN

## 2023-08-26 MED ORDER — IOHEXOL 350 MG/ML SOLN
INTRAVENOUS | Status: DC | PRN
  Administered 2023-08-26: 50 mL via INTRA_ARTERIAL

## 2023-08-26 MED ORDER — ASPIRIN 81 MG PO CHEW: 81.0000 mg | CHEWABLE_TABLET | ORAL | Status: DC

## 2023-08-26 MED ORDER — SODIUM CHLORIDE 0.9 % IV SOLN: 250.0000 mL | INTRAVENOUS | Status: DC | PRN

## 2023-08-26 SURGICAL SUPPLY — 7 items
CATH 5FR JL3.5 JR4 ANG PIG MP (CATHETERS) IMPLANT
DEVICE RAD COMP TR BAND LRG (VASCULAR PRODUCTS) IMPLANT
GLIDESHEATH SLEND SS 6F .021 (SHEATH) IMPLANT
GUIDEWIRE INQWIRE 1.5J.035X260 (WIRE) IMPLANT
INQWIRE 1.5J .035X260CM (WIRE) ×1
PACK CARDIAC CATHETERIZATION (CUSTOM PROCEDURE TRAY) ×1 IMPLANT
SET ATX-X65L (MISCELLANEOUS) IMPLANT

## 2023-08-26 NOTE — Progress Notes (Signed)
Patient and friend was given discharge instructions/ both verbalized understanding.

## 2023-08-26 NOTE — Discharge Instructions (Signed)

## 2023-08-26 NOTE — Interval H&P Note (Signed)
History and Physical Interval Note:  08/26/2023 8:59 AM  [name]  has presented today for surgery, with the diagnosis of cad.  The various methods of treatment have been discussed with the patient and family. After consideration of risks, benefits and other options for treatment, the patient has consented to  Procedure(s): LEFT HEART CATH AND CORONARY ANGIOGRAPHY (N/A) as a surgical intervention.  The patient's history has been reviewed, patient examined, no change in status, stable for surgery.  I have reviewed the patient's chart and labs.  Questions were answered to the patient's satisfaction.     Tonny Bollman

## 2023-08-27 ENCOUNTER — Encounter (HOSPITAL_COMMUNITY): Payer: Self-pay | Admitting: Cardiovascular Disease

## 2023-09-12 ENCOUNTER — Other Ambulatory Visit: Payer: Self-pay | Admitting: *Deleted

## 2023-09-12 ENCOUNTER — Encounter: Payer: Self-pay | Admitting: *Deleted

## 2023-09-12 ENCOUNTER — Institutional Professional Consult (permissible substitution) (INDEPENDENT_AMBULATORY_CARE_PROVIDER_SITE_OTHER): Payer: 59 | Admitting: Thoracic Surgery (Cardiothoracic Vascular Surgery)

## 2023-09-12 VITALS — BP 169/74 | HR 100 | Resp 18 | Ht 60.0 in | Wt 170.0 lb

## 2023-09-12 DIAGNOSIS — I2510 Atherosclerotic heart disease of native coronary artery without angina pectoris: Secondary | ICD-10-CM | POA: Diagnosis not present

## 2023-09-12 NOTE — Progress Notes (Signed)
 [address]       [place] [address]             [phone]        Megan Pham [place] Medical Record #[id number] Date of Birth: 21-Feb-1946  Referring: Tonny Bollman, MD Primary Care: Beam, Chales Salmon, MD Primary Cardiologist:Sunit Odis Hollingshead, DO  Chief Complaint:    Chief Complaint  Patient presents with   Coronary Artery Disease    History of Present Illness:     Megan Pham 78 y.o. female presents for surgical evaluation of three-vessel coronary artery disease.  She has developed exertional dyspnea over the past several months, and states that she is unable to walk from her mailbox without getting short of breath.  She does occasionally have some chest tightness, but this is only with exertion as well.     Past Medical and Surgical History: Previous Chest Surgery: No Previous Chest Radiation: No Diabetes Mellitus: No.  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   Dyslipidemia    Family history of breast cancer 05/27/2022   Family history of gastric cancer 05/27/2022   HTN (hypertension)    Hyperlipidemia    Osteoarthritis    Osteopenia    Poison ivy    Pseudoptosis, unspecified laterality    Ulcer 1979   ulcerative colitis    Past Surgical History:  Procedure Laterality Date   ABDOMINAL HYSTERECTOMY  1973   Partial hysterectomy   APPENDECTOMY  1973   CHOLECYSTECTOMY  2000   COLONOSCOPY  2004   by Dr.James Weissman-had left sided diverticulosis   HAMMER TOE SURGERY  2004   HERNIA REPAIR     LEFT HEART CATH AND CORONARY ANGIOGRAPHY N/A 08/26/2023   Procedure: LEFT HEART CATH AND CORONARY ANGIOGRAPHY;  Surgeon: Tonny Bollman, MD;  Location: [facility]  INVASIVE CV LAB;  Service: Cardiovascular;  Laterality: N/A;   TONSILLECTOMY  1970    Social History:  Social History   Tobacco Use  Smoking Status Former   Current packs/day: 0.00   Types: Cigarettes   Quit date: 09/09/2006   Years since quitting: 17.0  Smokeless Tobacco Never    Social History   Substance and Sexual Activity  Alcohol Use No     Allergies  Allergen Reactions   Iodinated Contrast Media Hives and Itching   Cefoxitin Sodium In Dextrose Hives   Mefoxin [Cefoxitin] Hives    "Myfoxin" during surgery per pt.       Current Outpatient Medications  Medication Sig Dispense Refill   amLODipine (NORVASC) 5 MG tablet Take 1 tablet (5 mg total) by mouth daily. 90 tablet 3   aspirin EC 81 MG tablet Take 1 tablet (81 mg total) by mouth daily. Swallow whole.     Cholecalciferol (VITAMIN D3) 25 MCG (1000 UT) CAPS Take 1,000 Units by mouth daily.     diphenhydramine-acetaminophen (TYLENOL PM) 25-500 MG TABS tablet Take 2 tablets by mouth at bedtime.     losartan (COZAAR) 25 MG tablet Take 25 mg by mouth daily.     Multiple Vitamins-Minerals (PRESERVISION AREDS 2 PO) Take 1 tablet by mouth 2 (  two) times daily.     predniSONE & diphenhydrAMINE 3 x 50 MG & 1 x 50 MG KIT Take first dose of Prednisone 50 mg 13 hours (8 pm) before procedure. Take second dose of Prednisone 50 mg 7 hours (2 am) before procedure. Take third dose of Prednisone 50 mg and single dose of Benadryl 50 mg 1 hour before procedure (8 am). 1 kit 0   rosuvastatin (CRESTOR) 40 MG tablet Take 1 tablet (40 mg total) by mouth daily. 90 tablet 3   No current facility-administered medications for this visit.    (Not in a hospital admission)   Family History  Problem Relation Age of Onset   Arthritis Mother    Cancer Mother        lung   Arthritis Father    Cancer Father        throat and lung; d. 74   Breast cancer Sister 21   Other Sister        CTNNA1 mutation   Gastric cancer Sister        dx 27s;  ? colon cancer   Arthritis Maternal Aunt    Breast cancer Maternal Aunt        dx after 50   Arthritis Maternal Uncle    Cancer Maternal Uncle        ? behind ear   Arthritis Paternal Aunt    Arthritis Paternal Uncle    Arthritis Maternal Grandmother    Arthritis Maternal Grandfather    Cancer Maternal Grandfather        mouth   Arthritis Paternal Grandmother    Breast cancer Paternal Grandmother        d. 30s   Arthritis Paternal Grandfather    Cancer Paternal Grandfather        unknown type   Arthritis Son    Healthy Grandchild        x 2   Breast cancer Cousin        d. 58   Colon cancer Neg Hx      Review of Systems:   Review of Systems  Constitutional:  Positive for malaise/fatigue.  Respiratory:  Positive for shortness of breath.   Cardiovascular:  Positive for chest pain.  Neurological: Negative.       Physical Exam: BP (!) 169/74 (BP Location: Left Arm)   Pulse 100   Resp 18   Ht 5' (1.524 m)   Wt 170 lb (77.1 kg)   SpO2 94% Comment: ra  BMI 33.20 kg/m  Physical Exam Constitutional:      General: She is not in acute distress. HENT:     Head: Normocephalic and atraumatic.  Eyes:     Extraocular Movements: Extraocular movements intact.  Cardiovascular:     Rate and Rhythm: Tachycardia present.  Pulmonary:     Effort: Pulmonary effort is normal. No respiratory distress.  Abdominal:     General: Abdomen is flat. There is no distension.  Musculoskeletal:        General: Normal range of motion.     Cervical back: Normal range of motion.  Skin:    General: Skin is warm and dry.  Neurological:     General: No focal deficit present.     Mental Status: She is alert and oriented to person, place, and time.       Diagnostic Studies & Laboratory data:    Left Heart Catherization:  Intervention Echo: IMPRESSIONS     1. Left ventricular ejection fraction,  by estimation, is 60 to 65%. The  left ventricle has normal function. The left ventricle  has no regional  wall motion abnormalities. Left ventricular diastolic parameters are  consistent with Grade I diastolic  dysfunction (impaired relaxation).   2. Right ventricular systolic function is normal. The right ventricular  size is normal.   3. The mitral valve is degenerative. No evidence of mitral valve  regurgitation. No evidence of mitral stenosis.   4. The aortic valve is tricuspid. There is mild calcification of the  aortic valve. Aortic valve regurgitation is not visualized. Aortic valve  sclerosis/calcification is present, without any evidence of aortic  stenosis.   5. Aortic dilatation noted. There is borderline dilatation of the  ascending aorta, measuring 39 mm.   6. The inferior vena cava is normal in size with greater than 50%  respiratory variability, suggesting right atrial pressure of 3 mmHg.     EKG: Sinus tach with PVCs I have independently reviewed the above radiologic studies and discussed with the patient   Recent Lab Findings: Lab Results  Component Value Date   WBC 6.9 08/21/2023   HGB 13.1 08/21/2023   HCT 40.0 08/21/2023   PLT 309 08/21/2023   GLUCOSE 118 (H) 08/21/2023   CHOL 242 (H) 11/01/2021   TRIG 121.0 11/01/2021   HDL 64.00 11/01/2021   LDLDIRECT 187.4 10/14/2013   LDLCALC 154 (H) 11/01/2021   ALT 18 11/01/2021   AST 21 11/01/2021   NA 141 08/21/2023   K 4.5 08/21/2023   CL 102 08/21/2023   CREATININE 0.72 08/21/2023   BUN 12 08/21/2023   CO2 23 08/21/2023   TSH 3.34 11/01/2021   HGBA1C 6.0 11/01/2021      Assessment / Plan:   78 year old female with three-vessel coronary artery disease.  Left heart catheterization shows good distal targets.  Echocardiogram shows preserved biventricular function and no significant valvular disease.  We discussed the risks and benefits of coronary bypass grafting, and she is agreeable to proceed.  Carotid duplex is pending.     I  spent 55 minutes counseling the patient face to  face.   Corliss Skains 09/12/2023 2:07 PM

## 2023-09-12 NOTE — H&P (View-Only) (Signed)
[address]       [place] [address]             [phone]        Megan Pham [place] Medical Record #[id number] Date of Birth: 21-Feb-1946  Referring: Tonny Bollman, MD Primary Care: Beam, Chales Salmon, MD Primary Cardiologist:Sunit Odis Hollingshead, DO  Chief Complaint:    Chief Complaint  Patient presents with   Coronary Artery Disease    History of Present Illness:     Megan Pham 78 y.o. female presents for surgical evaluation of three-vessel coronary artery disease.  She has developed exertional dyspnea over the past several months, and states that she is unable to walk from her mailbox without getting short of breath.  She does occasionally have some chest tightness, but this is only with exertion as well.     Past Medical and Surgical History: Previous Chest Surgery: No Previous Chest Radiation: No Diabetes Mellitus: No.  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   Dyslipidemia    Family history of breast cancer 05/27/2022   Family history of gastric cancer 05/27/2022   HTN (hypertension)    Hyperlipidemia    Osteoarthritis    Osteopenia    Poison ivy    Pseudoptosis, unspecified laterality    Ulcer 1979   ulcerative colitis    Past Surgical History:  Procedure Laterality Date   ABDOMINAL HYSTERECTOMY  1973   Partial hysterectomy   APPENDECTOMY  1973   CHOLECYSTECTOMY  2000   COLONOSCOPY  2004   by Dr.James Weissman-had left sided diverticulosis   HAMMER TOE SURGERY  2004   HERNIA REPAIR     LEFT HEART CATH AND CORONARY ANGIOGRAPHY N/A 08/26/2023   Procedure: LEFT HEART CATH AND CORONARY ANGIOGRAPHY;  Surgeon: Tonny Bollman, MD;  Location: [facility]  INVASIVE CV LAB;  Service: Cardiovascular;  Laterality: N/A;   TONSILLECTOMY  1970    Social History:  Social History   Tobacco Use  Smoking Status Former   Current packs/day: 0.00   Types: Cigarettes   Quit date: 09/09/2006   Years since quitting: 17.0  Smokeless Tobacco Never    Social History   Substance and Sexual Activity  Alcohol Use No     Allergies  Allergen Reactions   Iodinated Contrast Media Hives and Itching   Cefoxitin Sodium In Dextrose Hives   Mefoxin [Cefoxitin] Hives    "Myfoxin" during surgery per pt.       Current Outpatient Medications  Medication Sig Dispense Refill   amLODipine (NORVASC) 5 MG tablet Take 1 tablet (5 mg total) by mouth daily. 90 tablet 3   aspirin EC 81 MG tablet Take 1 tablet (81 mg total) by mouth daily. Swallow whole.     Cholecalciferol (VITAMIN D3) 25 MCG (1000 UT) CAPS Take 1,000 Units by mouth daily.     diphenhydramine-acetaminophen (TYLENOL PM) 25-500 MG TABS tablet Take 2 tablets by mouth at bedtime.     losartan (COZAAR) 25 MG tablet Take 25 mg by mouth daily.     Multiple Vitamins-Minerals (PRESERVISION AREDS 2 PO) Take 1 tablet by mouth 2 (  two) times daily.     predniSONE & diphenhydrAMINE 3 x 50 MG & 1 x 50 MG KIT Take first dose of Prednisone 50 mg 13 hours (8 pm) before procedure. Take second dose of Prednisone 50 mg 7 hours (2 am) before procedure. Take third dose of Prednisone 50 mg and single dose of Benadryl 50 mg 1 hour before procedure (8 am). 1 kit 0   rosuvastatin (CRESTOR) 40 MG tablet Take 1 tablet (40 mg total) by mouth daily. 90 tablet 3   No current facility-administered medications for this visit.    (Not in a hospital admission)   Family History  Problem Relation Age of Onset   Arthritis Mother    Cancer Mother        lung   Arthritis Father    Cancer Father        throat and lung; d. 74   Breast cancer Sister 21   Other Sister        CTNNA1 mutation   Gastric cancer Sister        dx 27s;  ? colon cancer   Arthritis Maternal Aunt    Breast cancer Maternal Aunt        dx after 50   Arthritis Maternal Uncle    Cancer Maternal Uncle        ? behind ear   Arthritis Paternal Aunt    Arthritis Paternal Uncle    Arthritis Maternal Grandmother    Arthritis Maternal Grandfather    Cancer Maternal Grandfather        mouth   Arthritis Paternal Grandmother    Breast cancer Paternal Grandmother        d. 30s   Arthritis Paternal Grandfather    Cancer Paternal Grandfather        unknown type   Arthritis Son    Healthy Grandchild        x 2   Breast cancer Cousin        d. 58   Colon cancer Neg Hx      Review of Systems:   Review of Systems  Constitutional:  Positive for malaise/fatigue.  Respiratory:  Positive for shortness of breath.   Cardiovascular:  Positive for chest pain.  Neurological: Negative.       Physical Exam: BP (!) 169/74 (BP Location: Left Arm)   Pulse 100   Resp 18   Ht 5' (1.524 m)   Wt 170 lb (77.1 kg)   SpO2 94% Comment: ra  BMI 33.20 kg/m  Physical Exam Constitutional:      General: She is not in acute distress. HENT:     Head: Normocephalic and atraumatic.  Eyes:     Extraocular Movements: Extraocular movements intact.  Cardiovascular:     Rate and Rhythm: Tachycardia present.  Pulmonary:     Effort: Pulmonary effort is normal. No respiratory distress.  Abdominal:     General: Abdomen is flat. There is no distension.  Musculoskeletal:        General: Normal range of motion.     Cervical back: Normal range of motion.  Skin:    General: Skin is warm and dry.  Neurological:     General: No focal deficit present.     Mental Status: She is alert and oriented to person, place, and time.       Diagnostic Studies & Laboratory data:    Left Heart Catherization:  Intervention Echo: IMPRESSIONS     1. Left ventricular ejection fraction,  by estimation, is 60 to 65%. The  left ventricle has normal function. The left ventricle  has no regional  wall motion abnormalities. Left ventricular diastolic parameters are  consistent with Grade I diastolic  dysfunction (impaired relaxation).   2. Right ventricular systolic function is normal. The right ventricular  size is normal.   3. The mitral valve is degenerative. No evidence of mitral valve  regurgitation. No evidence of mitral stenosis.   4. The aortic valve is tricuspid. There is mild calcification of the  aortic valve. Aortic valve regurgitation is not visualized. Aortic valve  sclerosis/calcification is present, without any evidence of aortic  stenosis.   5. Aortic dilatation noted. There is borderline dilatation of the  ascending aorta, measuring 39 mm.   6. The inferior vena cava is normal in size with greater than 50%  respiratory variability, suggesting right atrial pressure of 3 mmHg.     EKG: Sinus tach with PVCs I have independently reviewed the above radiologic studies and discussed with the patient   Recent Lab Findings: Lab Results  Component Value Date   WBC 6.9 08/21/2023   HGB 13.1 08/21/2023   HCT 40.0 08/21/2023   PLT 309 08/21/2023   GLUCOSE 118 (H) 08/21/2023   CHOL 242 (H) 11/01/2021   TRIG 121.0 11/01/2021   HDL 64.00 11/01/2021   LDLDIRECT 187.4 10/14/2013   LDLCALC 154 (H) 11/01/2021   ALT 18 11/01/2021   AST 21 11/01/2021   NA 141 08/21/2023   K 4.5 08/21/2023   CL 102 08/21/2023   CREATININE 0.72 08/21/2023   BUN 12 08/21/2023   CO2 23 08/21/2023   TSH 3.34 11/01/2021   HGBA1C 6.0 11/01/2021      Assessment / Plan:   78 year old female with three-vessel coronary artery disease.  Left heart catheterization shows good distal targets.  Echocardiogram shows preserved biventricular function and no significant valvular disease.  We discussed the risks and benefits of coronary bypass grafting, and she is agreeable to proceed.  Carotid duplex is pending.     I  spent 55 minutes counseling the patient face to  face.   Corliss Skains 09/12/2023 2:07 PM

## 2023-09-18 ENCOUNTER — Ambulatory Visit (HOSPITAL_COMMUNITY)
Admission: RE | Admit: 2023-09-18 | Discharge: 2023-09-18 | Disposition: A | Discharge status: Home / Self Care | Payer: 59 | Source: Ambulatory Visit | Attending: Thoracic Surgery (Cardiothoracic Vascular Surgery) | Admitting: Thoracic Surgery (Cardiothoracic Vascular Surgery)

## 2023-09-18 DIAGNOSIS — I2510 Atherosclerotic heart disease of native coronary artery without angina pectoris: Secondary | ICD-10-CM | POA: Diagnosis present

## 2023-09-18 LAB — VAS US DOPPLER PRE CABG

## 2023-09-18 NOTE — Progress Notes (Signed)
 VASCULAR LAB    Pre CABG Dopplers have been performed.  See CV proc for preliminary results.   Antonela Freiman, RVT 09/18/2023, 3:17 PM

## 2023-09-25 ENCOUNTER — Telehealth: Payer: Self-pay | Admitting: Cardiology

## 2023-09-25 NOTE — Telephone Encounter (Signed)
Patient wanted to let Dr. Odis Hollingshead know she is having triple by-pass surgery with Dr. Cliffton Asters on 10/06/23.

## 2023-09-28 NOTE — Telephone Encounter (Signed)
Yes, I am aware of her care plan. Dr. Copper called me after her cath was done and we spoke regarding her presentation and cath findings and decision was to refer her to CT surgery. I wish her best of luck. Please let me know if any questions/concerns I can help with. Please range follow up in 6 week from date of sx.   Nyimah Shadduck Kaleva, DO, [facility]

## 2023-09-29 NOTE — Progress Notes (Unsigned)
Patient ID: Megan Pham, female   DOB: 1946/03/01, 78 y.o.   MRN: [id number]  Reason for Consult: No chief complaint on file.   Referred by Beam, Chales Salmon, MD  Subjective:     HPI  Megan Pham is a 78 y.o. female presenting for evaluation of carotid stenosis that was noted on a CABG workup.  She denies any previous stroke or strokelike symptoms.  Specifically she denies one-sided weakness, numbness, amaurosis, or speech issues.  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 4   Breast cancer Sister 90   Other Sister        CTNNA1 mutation   Gastric cancer Sister        dx 11s; ? colon cancer   Arthritis Maternal Aunt    Breast cancer Maternal Aunt        dx after 50   Arthritis Maternal Uncle    Cancer Maternal Uncle        ? behind ear   Arthritis Paternal Aunt    Arthritis Paternal Uncle    Arthritis Maternal Grandmother    Arthritis Maternal Grandfather    Cancer Maternal Grandfather        mouth   Arthritis Paternal Grandmother    Breast cancer Paternal Grandmother        d. 30s   Arthritis Paternal Grandfather    Cancer Paternal Grandfather        unknown type   Arthritis Son    Healthy Grandchild        x 2   Breast cancer Cousin        d. 39   Colon cancer Neg Hx    Past Surgical History:  Procedure Laterality Date   ABDOMINAL HYSTERECTOMY  1973    Partial hysterectomy   APPENDECTOMY  1973   CHOLECYSTECTOMY  2000   COLONOSCOPY  2004   by Dr.James Weissman-had left sided diverticulosis   HAMMER TOE SURGERY  2004   HERNIA REPAIR     LEFT HEART CATH AND CORONARY ANGIOGRAPHY N/A 08/26/2023   Procedure: LEFT HEART CATH AND CORONARY ANGIOGRAPHY;  Surgeon: Tonny Bollman, MD;  Location: [facility] INVASIVE CV LAB;  Service: Cardiovascular;  Laterality: N/A;   TONSILLECTOMY  1970    Short Social History:  Social History   Tobacco Use   Smoking status: Former    Current packs/day: 0.00    Types: Cigarettes    Quit date: 09/09/2006    Years since quitting: 17.0   Smokeless tobacco: Never  Substance Use Topics   Alcohol use: No    Allergies  Allergen Reactions   Iodinated Contrast Media Hives and Itching  Cefoxitin Sodium In Dextrose Hives   Mefoxin [Cefoxitin] Hives    "Myfoxin" during surgery per pt.     Current Outpatient Medications  Medication Sig Dispense Refill   amLODipine (NORVASC) 5 MG tablet Take 1 tablet (5 mg total) by mouth daily. 90 tablet 3   aspirin EC 81 MG tablet Take 1 tablet (81 mg total) by mouth daily. Swallow whole.     Cholecalciferol (VITAMIN D3) 25 MCG (1000 UT) CAPS Take 1,000 Units by mouth at bedtime.     diphenhydramine-acetaminophen (TYLENOL PM) 25-500 MG TABS tablet Take 2 tablets by mouth at bedtime.     losartan (COZAAR) 25 MG tablet Take 25 mg by mouth in the morning.     Multiple Vitamins-Minerals (PRESERVISION AREDS 2 PO) Take 1 tablet by mouth 2 (two) times daily.     predniSONE & diphenhydrAMINE 3 x 50 MG & 1 x 50 MG KIT Take first dose of Prednisone 50 mg 13 hours (8 pm) before procedure. Take second dose of Prednisone 50 mg 7 hours (2 am) before procedure. Take third dose of Prednisone 50 mg and single dose of Benadryl 50 mg 1 hour before procedure (8 am). (Patient not taking: Reported on 09/26/2023) 1 kit 0   rosuvastatin (CRESTOR) 40 MG tablet Take 1 tablet (40 mg total) by mouth daily. 90  tablet 3   No current facility-administered medications for this visit.    REVIEW OF SYSTEMS  Positive for ***  All other systems were reviewed and are negative     Objective:  Objective   There were no vitals filed for this visit. There is no height or weight on file to calculate BMI.  Physical Exam General: no acute distress Cardiac: hemodynamically stable Pulm: normal work of breathing Abdomen: non-tender, no pulsatile mass*** Neuro: alert, no focal deficit Extremities: no edema, cyanosis or wounds*** Vascular:   Right: ***  Left: ***   Data: Carotid duplex Right Carotid Findings:  +----------+--------+--------+--------+------------+--------+           PSV cm/sEDV cm/sStenosisDescribe    Comments  +----------+--------+--------+--------+------------+--------+  CCA Prox  103     20              homogeneous           +----------+--------+--------+--------+------------+--------+  CCA Distal88      16              homogeneous           +----------+--------+--------+--------+------------+--------+  ICA Prox  105     22              heterogenous          +----------+--------+--------+--------+------------+--------+  ICA Mid   107     23                                    +----------+--------+--------+--------+------------+--------+  ICA Distal117     35                                    +----------+--------+--------+--------+------------+--------+  ECA      188     15              homogeneous           +----------+--------+--------+--------+------------+--------+    Left Carotid Findings:  +---------+--------+-------+--------+------------------------+-------------  ----+  PSV cm/sEDV    StenosisDescribe                Comments                             cm/s                                                       +---------+--------+-------+--------+------------------------+-------------  ----+   CCA Prox 85      15                                     intimal                                                                     thickening          +---------+--------+-------+--------+------------------------+-------------  ----+  CCA Mid  314     69     <50%    irregular and                                                               heterogenous                                +---------+--------+-------+--------+------------------------+-------------  ----+  CCA     280     46             heterogenous                                Distal                                                                      +---------+--------+-------+--------+------------------------+-------------  ----+  ICA Prox 211     41     40-59%  heterogenous                                +---------+--------+-------+--------+------------------------+-------------  ----+  ICA Mid  88      21                                                         +---------+--------+-------+--------+------------------------+-------------  ----+  ICA     64      18                                                         Distal                                                                      +---------+--------+-------+--------+------------------------+-------------  ----+  ECA     239     25     >50%    heterogenous                                +---------   ABI +------------------+-----+---------+  Rt Pressure (mmHg)IndexWaveform   +------------------+-----+---------+  162                   triphasic  +------------------+-----+---------+  185              1.10 triphasic  +------------------+-----+---------+  149              0.89 triphasic  +------------------+-----+---------+  116              0.69 Abnormal   +------------------+-----+---------+   +------------------+-----+---------+  Lt Pressure  (mmHg)IndexWaveform   +------------------+-----+---------+  168                   triphasic  +------------------+-----+---------+  146              0.87 triphasic  +------------------+-----+---------+  166              0.99 triphasic  +------------------+-----+---------+  108              0.64 Abnormal   +------------------+-----+---------+   Echo reviewed EF 66 5%, grade 1 diastolic dysfunction, RSVP normal   Assessment/Plan:     Megan Pham is a 78 y.o. female presenting for evaluation of asymptomatic carotid stenosis that was noted on a CABG workup.  Right < 40%, left 40 to 59%. She is scheduled undergo CABG with Dr. Cliffton Asters on 10/06/2023.  Explained that the threshold for treatment for asymptomatic carotid stenosis was 80%.  We discussed that we will continue with annual surveillance.  Follow-up in 1 year with repeat carotid duplex     Recommendations to optimize cardiovascular risk: Abstinence from all tobacco products. Blood glucose control with goal A1c < 7%. Blood pressure control with goal blood pressure < 140/90 mmHg. Lipid reduction therapy with goal LDL-C <100 mg/dL  Aspirin 81mg  PO QD.  Atorvastatin 40-80mg  PO QD (or other "high intensity" statin therapy).     Daria Pastures MD [facility]

## 2023-09-30 ENCOUNTER — Ambulatory Visit (INDEPENDENT_AMBULATORY_CARE_PROVIDER_SITE_OTHER): Payer: 59 | Admitting: Vascular Surgery

## 2023-09-30 ENCOUNTER — Encounter: Payer: Self-pay | Admitting: Vascular Surgery

## 2023-09-30 VITALS — BP 180/79 | HR 90 | Temp 97.9°F | Resp 20 | Ht 60.0 in | Wt 170.0 lb

## 2023-09-30 DIAGNOSIS — I6523 Occlusion and stenosis of bilateral carotid arteries: Secondary | ICD-10-CM

## 2023-10-01 NOTE — Telephone Encounter (Signed)
F/U appt for 6 week post-sx scheduled with Dr. Odis Hollingshead for 11/17/23. Pt is aware.

## 2023-10-01 NOTE — Pre-Procedure Instructions (Signed)
Surgical Instructions   Your procedure is scheduled on October 06, 2023. Report to [facility] Main Entrance "A" at 5:30 A.M., then check in with the Admitting office. Any questions or running late day of surgery: call [phone]  Questions prior to your surgery date: call [phone], Monday-Friday, 8am-4pm. If you experience any cold or flu symptoms such as cough, fever, chills, shortness of breath, etc. between now and your scheduled surgery, please notify us at the above number.     Remember:  Do not eat or drink after midnight the night before your surgery    Take these medicines the morning of surgery with A SIP OF WATER: amLODipine (NORVASC)  rosuvastatin (CRESTOR)  predniSONE & diphenhydrAMINE    Continue taking your Aspirin through the day before surgery. DO NOT take any the morning of surgery.   One week prior to surgery, STOP taking any Aleve, Naproxen, Ibuprofen, Motrin, Advil, Goody's, BC's, all herbal medications, fish oil, and non-prescription vitamins.                     Do NOT Smoke (Tobacco/Vaping) for 24 hours prior to your procedure.  If you use a CPAP at night, you may bring your mask/headgear for your overnight stay.   You will be asked to remove any contacts, glasses, piercing's, hearing aid's, dentures/partials prior to surgery. Please bring cases for these items if needed.    Patients discharged the day of surgery will not be allowed to drive home, and someone needs to stay with them for 24 hours.  SURGICAL WAITING ROOM VISITATION Patients may have no more than 2 support people in the waiting area - these visitors may rotate.   Pre-op nurse will coordinate an appropriate time for 1 ADULT support person, who may not rotate, to accompany patient in pre-op.  Children under the age of 65 must have an adult with them who is not the patient and must remain in the main waiting area with an adult.  If the patient needs to stay at the hospital during part of  their recovery, the visitor guidelines for inpatient rooms apply.  Please refer to the [facility] website for the visitor guidelines for any additional information.   If you received a COVID test during your pre-op visit  it is requested that you wear a mask when out in public, stay away from anyone that may not be feeling well and notify your surgeon if you develop symptoms. If you have been in contact with anyone that has tested positive in the last 10 days please notify you surgeon.      Pre-operative CHG Bathing Instructions   You can play a key role in reducing the risk of infection after surgery. Your skin needs to be as free of germs as possible. You can reduce the number of germs on your skin by washing with CHG (chlorhexidine gluconate) soap before surgery. CHG is an antiseptic soap that kills germs and continues to kill germs even after washing.   DO NOT use if you have an allergy to chlorhexidine/CHG or antibacterial soaps. If your skin becomes reddened or irritated, stop using the CHG and notify one of our RNs at [phone].              TAKE A SHOWER THE NIGHT BEFORE SURGERY AND THE DAY OF SURGERY    Please keep in mind the following:  DO NOT shave, including legs and underarms, 48 hours prior to surgery.   You  may shave your face before/day of surgery.  Place clean sheets on your bed the night before surgery Use a clean washcloth (not used since being washed) for each shower. DO NOT sleep with pet's night before surgery.  CHG Shower Instructions:  Wash your face and private area with normal soap. If you choose to wash your hair, wash first with your normal shampoo.  After you use shampoo/soap, rinse your hair and body thoroughly to remove shampoo/soap residue.  Turn the water OFF and apply half the bottle of CHG soap to a CLEAN washcloth.  Apply CHG soap ONLY FROM YOUR NECK DOWN TO YOUR TOES (washing for 3-5 minutes)  DO NOT use CHG soap on face, private areas, open  wounds, or sores.  Pay special attention to the area where your surgery is being performed.  If you are having back surgery, having someone wash your back for you may be helpful. Wait 2 minutes after CHG soap is applied, then you may rinse off the CHG soap.  Pat dry with a clean towel  Put on clean pajamas    Additional instructions for the day of surgery: DO NOT APPLY any lotions, deodorants, cologne, or perfumes.   Do not wear jewelry or makeup Do not wear nail polish, gel polish, artificial nails, or any other type of covering on natural nails (fingers and toes) Do not bring valuables to the hospital. [facility] is not responsible for valuables/personal belongings. Put on clean/comfortable clothes.  Please brush your teeth.  Ask your nurse before applying any prescription medications to the skin.

## 2023-10-02 ENCOUNTER — Encounter (HOSPITAL_COMMUNITY)
Admission: RE | Admit: 2023-10-02 | Discharge: 2023-10-02 | Disposition: A | Discharge status: Home / Self Care | Payer: 59 | Source: Ambulatory Visit | Attending: Thoracic Surgery (Cardiothoracic Vascular Surgery) | Admitting: Thoracic Surgery (Cardiothoracic Vascular Surgery)

## 2023-10-02 ENCOUNTER — Encounter (HOSPITAL_COMMUNITY): Payer: Self-pay

## 2023-10-02 ENCOUNTER — Other Ambulatory Visit: Payer: Self-pay

## 2023-10-02 ENCOUNTER — Ambulatory Visit (HOSPITAL_COMMUNITY)
Admission: RE | Admit: 2023-10-02 | Discharge: 2023-10-02 | Disposition: A | Discharge status: Home / Self Care | Payer: 59 | Source: Ambulatory Visit | Attending: Thoracic Surgery (Cardiothoracic Vascular Surgery) | Admitting: Thoracic Surgery (Cardiothoracic Vascular Surgery)

## 2023-10-02 VITALS — BP 170/49 | HR 79 | Temp 97.9°F | Resp 19 | Ht 60.0 in | Wt 170.3 lb

## 2023-10-02 DIAGNOSIS — I2510 Atherosclerotic heart disease of native coronary artery without angina pectoris: Secondary | ICD-10-CM | POA: Diagnosis not present

## 2023-10-02 DIAGNOSIS — R739 Hyperglycemia, unspecified: Secondary | ICD-10-CM | POA: Insufficient documentation

## 2023-10-02 DIAGNOSIS — Z01818 Encounter for other preprocedural examination: Secondary | ICD-10-CM | POA: Insufficient documentation

## 2023-10-02 HISTORY — DX: Dyspnea, unspecified: R06.00

## 2023-10-02 LAB — COMPREHENSIVE METABOLIC PANEL
ALT: 24 U/L (ref 0–44)
AST: 25 U/L (ref 15–41)
Albumin: 4.1 g/dL (ref 3.5–5.0)
Alkaline Phosphatase: 102 U/L (ref 38–126)
Anion gap: 8 (ref 5–15)
BUN: 13 mg/dL (ref 8–23)
CO2: 26 mmol/L (ref 22–32)
Calcium: 9.4 mg/dL (ref 8.9–10.3)
Chloride: 103 mmol/L (ref 98–111)
Creatinine, Ser: 0.68 mg/dL (ref 0.44–1.00)
GFR, Estimated: >60 mL/min (ref >60)
Glucose, Bld: 118 mg/dL — ABNORMAL HIGH (ref 70–99)
Potassium: 4.7 mmol/L (ref 3.5–5.1)
Sodium: 137 mmol/L (ref 135–145)
Total Bilirubin: 0.5 mg/dL (ref 0.0–1.2)
Total Protein: 7.6 g/dL (ref 6.5–8.1)

## 2023-10-02 LAB — CBC
HCT: 41.1 % (ref 36.0–46.0)
Hemoglobin: 13.1 g/dL (ref 12.0–15.0)
MCH: 29.2 pg (ref 26.0–34.0)
MCHC: 31.9 g/dL (ref 30.0–36.0)
MCV: 91.7 fL (ref 80.0–100.0)
Platelets: 324 10*3/uL (ref 150–400)
RBC: 4.48 MIL/uL (ref 3.87–5.11)
RDW: 12.3 % (ref 11.5–15.5)
WBC: 7.6 10*3/uL (ref 4.0–10.5)
nRBC: 0 % (ref 0.0–0.2)

## 2023-10-02 LAB — URINALYSIS, ROUTINE W REFLEX MICROSCOPIC
Bacteria, UA: NONE SEEN
Bilirubin Urine: NEGATIVE
Glucose, UA: NEGATIVE mg/dL
Hgb urine dipstick: NEGATIVE
Ketones, ur: NEGATIVE mg/dL
Nitrite: NEGATIVE
Protein, ur: NEGATIVE mg/dL
Specific Gravity, Urine: 1.01 (ref 1.005–1.030)
pH: 6 (ref 5.0–8.0)

## 2023-10-02 LAB — HEMOGLOBIN A1C
Hgb A1c MFr Bld: 6 % — ABNORMAL HIGH (ref 4.8–5.6)
Mean Plasma Glucose: 125.5 mg/dL

## 2023-10-02 LAB — PROTIME-INR
INR: 1 (ref 0.8–1.2)
Prothrombin Time: 13.6 s (ref 11.4–15.2)

## 2023-10-02 LAB — SURGICAL PCR SCREEN
MRSA, PCR: NEGATIVE
Staphylococcus aureus: NEGATIVE

## 2023-10-02 LAB — APTT: aPTT: 31 s (ref 24–36)

## 2023-10-02 NOTE — Progress Notes (Signed)
PCP - Dr. Jasper Loser Cardiologist - Dr. Tessa Lerner  PPM/ICD - denies   Chest x-ray - 10/02/23 EKG - 10/02/23 Stress Test - 20+ years ago, normal per pt ECHO - 07/31/23 Cardiac Cath - 08/26/23  Sleep Study - denies   DM- denies  Last dose of GLP1 agonist-  n/a   ASA/Blood Thinner Instructions: n/a   ERAS Protcol - no, NPO   COVID TEST- n/a   Anesthesia review: yes, cardiac hx  Patient denies shortness of breath, fever, cough and chest pain at PAT appointment   All instructions explained to the patient, with a verbal understanding of the material. Patient agrees to go over the instructions while at home for a better understanding. The opportunity to ask questions was provided.

## 2023-10-03 MED ORDER — PLASMA-LYTE A IV SOLN
INTRAVENOUS | Status: DC
  Filled 2023-10-03: qty 2.5

## 2023-10-03 MED ORDER — MANNITOL 20 % IV SOLN
INTRAVENOUS | Status: DC
  Filled 2023-10-03: qty 13

## 2023-10-03 MED ORDER — LEVOFLOXACIN IN D5W 500 MG/100ML IV SOLN
500.0000 mg | INTRAVENOUS | Status: DC
  Filled 2023-10-03: qty 100

## 2023-10-03 MED ORDER — TRANEXAMIC ACID (OHS) BOLUS VIA INFUSION
15.0000 mg/kg | INTRAVENOUS | Status: AC
  Administered 2023-10-06: 1158 mg via INTRAVENOUS
  Filled 2023-10-03: qty 1158

## 2023-10-03 MED ORDER — NOREPINEPHRINE 4 MG/250ML-% IV SOLN
0.0000 ug/min | INTRAVENOUS | Status: DC
Start: 2023-10-04 — End: 2023-10-03
  Filled 2023-10-03: qty 250

## 2023-10-03 MED ORDER — POTASSIUM CHLORIDE 2 MEQ/ML IV SOLN
80.0000 meq | INTRAVENOUS | Status: DC
  Filled 2023-10-03: qty 40

## 2023-10-03 MED ORDER — EPINEPHRINE HCL 5 MG/250ML IV SOLN IN NS
0.0000 ug/min | INTRAVENOUS | Status: DC
  Filled 2023-10-03: qty 250

## 2023-10-03 MED ORDER — NITROGLYCERIN IN D5W 200-5 MCG/ML-% IV SOLN
2.0000 ug/min | INTRAVENOUS | Status: DC
Start: 2023-10-04 — End: 2023-10-03
  Filled 2023-10-03: qty 250

## 2023-10-03 MED ORDER — TRANEXAMIC ACID 1000 MG/10ML IV SOLN
1.5000 mg/kg/h | INTRAVENOUS | Status: DC
  Filled 2023-10-03: qty 25

## 2023-10-03 MED ORDER — VANCOMYCIN HCL 1250 MG/250ML IV SOLN
1250.0000 mg | INTRAVENOUS | Status: DC
  Filled 2023-10-03: qty 250

## 2023-10-03 MED ORDER — MILRINONE LACTATE IN DEXTROSE 20-5 MG/100ML-% IV SOLN
0.3000 ug/kg/min | INTRAVENOUS | Status: DC
  Filled 2023-10-03: qty 100

## 2023-10-03 MED ORDER — TRANEXAMIC ACID (OHS) PUMP PRIME SOLUTION
2.0000 mg/kg | INTRAVENOUS | Status: DC
  Filled 2023-10-03: qty 1.54

## 2023-10-03 MED ORDER — HEPARIN 30,000 UNITS/1000 ML (OHS) CELLSAVER SOLUTION
Status: DC
  Filled 2023-10-03: qty 1000

## 2023-10-03 MED ORDER — INSULIN REGULAR(HUMAN) IN NACL 100-0.9 UT/100ML-% IV SOLN: INTRAVENOUS | Status: DC

## 2023-10-03 MED ORDER — DEXMEDETOMIDINE HCL IN NACL 400 MCG/100ML IV SOLN
0.1000 ug/kg/h | INTRAVENOUS | Status: DC
  Filled 2023-10-03: qty 100

## 2023-10-03 MED ORDER — VANCOMYCIN HCL 1250 MG/250ML IV SOLN
1250.0000 mg | INTRAVENOUS | Status: AC
  Administered 2023-10-06: 1250 mg via INTRAVENOUS
  Filled 2023-10-03: qty 250

## 2023-10-03 MED ORDER — INSULIN REGULAR(HUMAN) IN NACL 100-0.9 UT/100ML-% IV SOLN
INTRAVENOUS | Status: AC
  Administered 2023-10-06: 3 [IU]/h via INTRAVENOUS
  Filled 2023-10-03: qty 100

## 2023-10-03 MED ORDER — TRANEXAMIC ACID (OHS) BOLUS VIA INFUSION
15.0000 mg/kg | INTRAVENOUS | Status: DC
  Filled 2023-10-03: qty 1158

## 2023-10-03 MED ORDER — TRANEXAMIC ACID 1000 MG/10ML IV SOLN
1.5000 mg/kg/h | INTRAVENOUS | Status: AC
  Administered 2023-10-06: 1.5 mg/kg/h via INTRAVENOUS
  Filled 2023-10-03: qty 25

## 2023-10-03 MED ORDER — NOREPINEPHRINE 4 MG/250ML-% IV SOLN
0.0000 ug/min | INTRAVENOUS | Status: AC
Start: 2023-10-06 — End: 2023-10-07
  Administered 2023-10-06: 3 ug/min via INTRAVENOUS
  Filled 2023-10-03: qty 250

## 2023-10-03 MED ORDER — DEXMEDETOMIDINE HCL IN NACL 400 MCG/100ML IV SOLN
0.1000 ug/kg/h | INTRAVENOUS | Status: AC
  Administered 2023-10-06: .4 ug/kg/h via INTRAVENOUS
  Filled 2023-10-03: qty 100

## 2023-10-03 MED ORDER — PHENYLEPHRINE HCL-NACL 20-0.9 MG/250ML-% IV SOLN
30.0000 ug/min | INTRAVENOUS | Status: AC
  Administered 2023-10-06: 20 ug/min via INTRAVENOUS
  Filled 2023-10-03: qty 250

## 2023-10-03 MED ORDER — NITROGLYCERIN IN D5W 200-5 MCG/ML-% IV SOLN
2.0000 ug/min | INTRAVENOUS | Status: DC
Start: 2023-10-06 — End: 2023-10-07
  Filled 2023-10-03: qty 250

## 2023-10-03 MED ORDER — LEVOFLOXACIN IN D5W 500 MG/100ML IV SOLN
500.0000 mg | INTRAVENOUS | Status: AC
  Administered 2023-10-06: 500 mg via INTRAVENOUS
  Filled 2023-10-03: qty 100

## 2023-10-03 MED ORDER — PHENYLEPHRINE HCL-NACL 20-0.9 MG/250ML-% IV SOLN
30.0000 ug/min | INTRAVENOUS | Status: DC
  Filled 2023-10-03: qty 250

## 2023-10-05 NOTE — Anesthesia Preprocedure Evaluation (Signed)
Anesthesia Evaluation  Patient identified by MRN, date of birth, ID band Patient awake    Reviewed: Allergy & Precautions, NPO status , Patient's Chart, lab work & pertinent test results  Airway Mallampati: II  TM Distance: >3 FB Neck ROM: Full    Dental  (+) Dental Advisory Given, Partial Upper   Pulmonary former smoker   Pulmonary exam normal breath sounds clear to auscultation       Cardiovascular hypertension, Pt. on medications + CAD  Normal cardiovascular exam Rhythm:Regular Rate:Normal  Echo 07/31/23:  1. Left ventricular ejection fraction, by estimation, is 60 to 65%. The  left ventricle has normal function. The left ventricle has no regional  wall motion abnormalities. Left ventricular diastolic parameters are  consistent with Grade I diastolic  dysfunction (impaired relaxation).   2. Right ventricular systolic function is normal. The right ventricular  size is normal.   3. The mitral valve is degenerative. No evidence of mitral valve  regurgitation. No evidence of mitral stenosis.   4. The aortic valve is tricuspid. There is mild calcification of the  aortic valve. Aortic valve regurgitation is not visualized. Aortic valve  sclerosis/calcification is present, without any evidence of aortic  stenosis.   5. Aortic dilatation noted. There is borderline dilatation of the  ascending aorta, measuring 39 mm.   6. The inferior vena cava is normal in size with greater than 50%  respiratory variability, suggesting right atrial pressure of 3 mmHg.     Neuro/Psych negative neurological ROS     GI/Hepatic negative GI ROS, Neg liver ROS,,,  Endo/Other  negative endocrine ROS  Obesity   Renal/GU negative Renal ROS     Musculoskeletal negative musculoskeletal ROS (+)    Abdominal   Peds  Hematology negative hematology ROS (+)   Anesthesia Other Findings   Reproductive/Obstetrics                               Anesthesia Physical Anesthesia Plan  ASA: 4  Anesthesia Plan: General   Post-op Pain Management:    Induction: Intravenous  PONV Risk Score and Plan: 3 and Midazolam  Airway Management Planned: Oral ETT  Additional Equipment: Arterial line, CVP, TEE and Ultrasound Guidance Line Placement  Intra-op Plan:   Post-operative Plan: Post-operative intubation/ventilation  Informed Consent: I have reviewed the patients History and Physical, chart, labs and discussed the procedure including the risks, benefits and alternatives for the proposed anesthesia with the patient or authorized representative who has indicated his/her understanding and acceptance.     Dental advisory given  Plan Discussed with: CRNA  Anesthesia Plan Comments: [facility])         Anesthesia Quick Evaluation

## 2023-10-06 ENCOUNTER — Inpatient Hospital Stay (HOSPITAL_COMMUNITY): Payer: 59 | Admitting: Anesthesiology

## 2023-10-06 ENCOUNTER — Inpatient Hospital Stay (HOSPITAL_COMMUNITY): Payer: 59

## 2023-10-06 ENCOUNTER — Inpatient Hospital Stay (HOSPITAL_COMMUNITY)
Admission: RE | Admit: 2023-10-06 | Discharge: 2023-10-18 | DRG: 235 | Disposition: A | Discharge status: Home Health | Payer: 59 | Attending: Thoracic Surgery (Cardiothoracic Vascular Surgery) | Admitting: Thoracic Surgery (Cardiothoracic Vascular Surgery)

## 2023-10-06 ENCOUNTER — Other Ambulatory Visit: Payer: Self-pay

## 2023-10-06 ENCOUNTER — Encounter (HOSPITAL_COMMUNITY)
Admission: RE | Disposition: A | Discharge status: Home Health | Payer: Self-pay | Source: Home / Self Care | Attending: Thoracic Surgery (Cardiothoracic Vascular Surgery)

## 2023-10-06 ENCOUNTER — Encounter (HOSPITAL_COMMUNITY): Payer: Self-pay | Admitting: Thoracic Surgery (Cardiothoracic Vascular Surgery)

## 2023-10-06 ENCOUNTER — Inpatient Hospital Stay (HOSPITAL_COMMUNITY): Payer: 59 | Admitting: Physician Assistant

## 2023-10-06 DIAGNOSIS — Z800 Family history of malignant neoplasm of digestive organs: Secondary | ICD-10-CM | POA: Diagnosis not present

## 2023-10-06 DIAGNOSIS — Z90711 Acquired absence of uterus with remaining cervical stump: Secondary | ICD-10-CM

## 2023-10-06 DIAGNOSIS — J90 Pleural effusion, not elsewhere classified: Secondary | ICD-10-CM | POA: Diagnosis not present

## 2023-10-06 DIAGNOSIS — M1990 Unspecified osteoarthritis, unspecified site: Secondary | ICD-10-CM | POA: Diagnosis present

## 2023-10-06 DIAGNOSIS — I480 Paroxysmal atrial fibrillation: Secondary | ICD-10-CM | POA: Diagnosis not present

## 2023-10-06 DIAGNOSIS — R0609 Other forms of dyspnea: Secondary | ICD-10-CM

## 2023-10-06 DIAGNOSIS — D62 Acute posthemorrhagic anemia: Secondary | ICD-10-CM | POA: Diagnosis not present

## 2023-10-06 DIAGNOSIS — J95811 Postprocedural pneumothorax: Secondary | ICD-10-CM | POA: Diagnosis not present

## 2023-10-06 DIAGNOSIS — I10 Essential (primary) hypertension: Secondary | ICD-10-CM | POA: Diagnosis present

## 2023-10-06 DIAGNOSIS — E876 Hypokalemia: Secondary | ICD-10-CM | POA: Diagnosis not present

## 2023-10-06 DIAGNOSIS — J9601 Acute respiratory failure with hypoxia: Secondary | ICD-10-CM | POA: Diagnosis not present

## 2023-10-06 DIAGNOSIS — Z8261 Family history of arthritis: Secondary | ICD-10-CM

## 2023-10-06 DIAGNOSIS — I25118 Atherosclerotic heart disease of native coronary artery with other forms of angina pectoris: Secondary | ICD-10-CM | POA: Diagnosis not present

## 2023-10-06 DIAGNOSIS — I2510 Atherosclerotic heart disease of native coronary artery without angina pectoris: Secondary | ICD-10-CM

## 2023-10-06 DIAGNOSIS — Z87891 Personal history of nicotine dependence: Secondary | ICD-10-CM | POA: Diagnosis not present

## 2023-10-06 DIAGNOSIS — I440 Atrioventricular block, first degree: Secondary | ICD-10-CM | POA: Diagnosis present

## 2023-10-06 DIAGNOSIS — N170 Acute kidney failure with tubular necrosis: Secondary | ICD-10-CM | POA: Diagnosis not present

## 2023-10-06 DIAGNOSIS — N179 Acute kidney failure, unspecified: Secondary | ICD-10-CM

## 2023-10-06 DIAGNOSIS — K5900 Constipation, unspecified: Secondary | ICD-10-CM | POA: Diagnosis not present

## 2023-10-06 DIAGNOSIS — E785 Hyperlipidemia, unspecified: Secondary | ICD-10-CM | POA: Diagnosis present

## 2023-10-06 DIAGNOSIS — N990 Postprocedural (acute) (chronic) kidney failure: Secondary | ICD-10-CM | POA: Diagnosis not present

## 2023-10-06 DIAGNOSIS — I4892 Unspecified atrial flutter: Secondary | ICD-10-CM

## 2023-10-06 DIAGNOSIS — D65 Disseminated intravascular coagulation [defibrination syndrome]: Secondary | ICD-10-CM | POA: Diagnosis not present

## 2023-10-06 DIAGNOSIS — J939 Pneumothorax, unspecified: Secondary | ICD-10-CM | POA: Diagnosis not present

## 2023-10-06 DIAGNOSIS — Z881 Allergy status to other antibiotic agents status: Secondary | ICD-10-CM

## 2023-10-06 DIAGNOSIS — E669 Obesity, unspecified: Secondary | ICD-10-CM | POA: Diagnosis present

## 2023-10-06 DIAGNOSIS — E1165 Type 2 diabetes mellitus with hyperglycemia: Secondary | ICD-10-CM | POA: Diagnosis present

## 2023-10-06 DIAGNOSIS — Z91041 Radiographic dye allergy status: Secondary | ICD-10-CM

## 2023-10-06 DIAGNOSIS — Z9049 Acquired absence of other specified parts of digestive tract: Secondary | ICD-10-CM | POA: Diagnosis not present

## 2023-10-06 DIAGNOSIS — J449 Chronic obstructive pulmonary disease, unspecified: Secondary | ICD-10-CM | POA: Diagnosis present

## 2023-10-06 DIAGNOSIS — Z803 Family history of malignant neoplasm of breast: Secondary | ICD-10-CM

## 2023-10-06 DIAGNOSIS — J9811 Atelectasis: Secondary | ICD-10-CM | POA: Diagnosis not present

## 2023-10-06 DIAGNOSIS — J810 Acute pulmonary edema: Secondary | ICD-10-CM | POA: Diagnosis not present

## 2023-10-06 DIAGNOSIS — M8580 Other specified disorders of bone density and structure, unspecified site: Secondary | ICD-10-CM | POA: Diagnosis present

## 2023-10-06 DIAGNOSIS — Z951 Presence of aortocoronary bypass graft: Secondary | ICD-10-CM

## 2023-10-06 DIAGNOSIS — I959 Hypotension, unspecified: Secondary | ICD-10-CM | POA: Diagnosis not present

## 2023-10-06 DIAGNOSIS — I25799 Atherosclerosis of other coronary artery bypass graft(s) with unspecified angina pectoris: Secondary | ICD-10-CM | POA: Diagnosis not present

## 2023-10-06 DIAGNOSIS — R739 Hyperglycemia, unspecified: Secondary | ICD-10-CM | POA: Diagnosis not present

## 2023-10-06 DIAGNOSIS — I9789 Other postprocedural complications and disorders of the circulatory system, not elsewhere classified: Secondary | ICD-10-CM

## 2023-10-06 DIAGNOSIS — Z801 Family history of malignant neoplasm of trachea, bronchus and lung: Secondary | ICD-10-CM

## 2023-10-06 DIAGNOSIS — Z7982 Long term (current) use of aspirin: Secondary | ICD-10-CM

## 2023-10-06 DIAGNOSIS — Z79899 Other long term (current) drug therapy: Secondary | ICD-10-CM

## 2023-10-06 DIAGNOSIS — Z6834 Body mass index (BMI) 34.0-34.9, adult: Secondary | ICD-10-CM | POA: Diagnosis not present

## 2023-10-06 DIAGNOSIS — Z8249 Family history of ischemic heart disease and other diseases of the circulatory system: Secondary | ICD-10-CM

## 2023-10-06 HISTORY — PX: CORONARY ARTERY BYPASS GRAFT: SHX141

## 2023-10-06 HISTORY — PX: TEE WITHOUT CARDIOVERSION: SHX5443

## 2023-10-06 LAB — POCT I-STAT, CHEM 8
BUN: 10 mg/dL (ref 8–23)
BUN: 7 mg/dL — ABNORMAL LOW (ref 8–23)
BUN: 8 mg/dL (ref 8–23)
BUN: 8 mg/dL (ref 8–23)
BUN: 8 mg/dL (ref 8–23)
BUN: 7 mg/dL — ABNORMAL LOW (ref 8–23)
BUN: 8 mg/dL (ref 8–23)
Calcium, Ion: 1.22 mmol/L (ref 1.15–1.40)
Calcium, Ion: 1.17 mmol/L (ref 1.15–1.40)
Calcium, Ion: 1.2 mmol/L (ref 1.15–1.40)
Calcium, Ion: 0.94 mmol/L — ABNORMAL LOW (ref 1.15–1.40)
Calcium, Ion: 0.95 mmol/L — ABNORMAL LOW (ref 1.15–1.40)
Calcium, Ion: 0.91 mmol/L — ABNORMAL LOW (ref 1.15–1.40)
Calcium, Ion: 1.23 mmol/L (ref 1.15–1.40)
Chloride: 106 mmol/L (ref 98–111)
Chloride: 108 mmol/L (ref 98–111)
Chloride: 106 mmol/L (ref 98–111)
Chloride: 102 mmol/L (ref 98–111)
Chloride: 104 mmol/L (ref 98–111)
Chloride: 105 mmol/L (ref 98–111)
Chloride: 108 mmol/L (ref 98–111)
Creatinine, Ser: 0.6 mg/dL (ref 0.44–1.00)
Creatinine, Ser: 0.5 mg/dL (ref 0.44–1.00)
Creatinine, Ser: 0.6 mg/dL (ref 0.44–1.00)
Creatinine, Ser: 0.5 mg/dL (ref 0.44–1.00)
Creatinine, Ser: 0.5 mg/dL (ref 0.44–1.00)
Creatinine, Ser: 0.4 mg/dL — ABNORMAL LOW (ref 0.44–1.00)
Creatinine, Ser: 0.5 mg/dL (ref 0.44–1.00)
Glucose, Bld: 134 mg/dL — ABNORMAL HIGH (ref 70–99)
Glucose, Bld: 106 mg/dL — ABNORMAL HIGH (ref 70–99)
Glucose, Bld: 116 mg/dL — ABNORMAL HIGH (ref 70–99)
Glucose, Bld: 154 mg/dL — ABNORMAL HIGH (ref 70–99)
Glucose, Bld: 164 mg/dL — ABNORMAL HIGH (ref 70–99)
Glucose, Bld: 154 mg/dL — ABNORMAL HIGH (ref 70–99)
Glucose, Bld: 144 mg/dL — ABNORMAL HIGH (ref 70–99)
HCT: 34 % — ABNORMAL LOW (ref 36.0–46.0)
HCT: 29 % — ABNORMAL LOW (ref 36.0–46.0)
HCT: 28 % — ABNORMAL LOW (ref 36.0–46.0)
HCT: 21 % — ABNORMAL LOW (ref 36.0–46.0)
HCT: 23 % — ABNORMAL LOW (ref 36.0–46.0)
HCT: 20 % — ABNORMAL LOW (ref 36.0–46.0)
HCT: 25 % — ABNORMAL LOW (ref 36.0–46.0)
Hemoglobin: 11.6 g/dL — ABNORMAL LOW (ref 12.0–15.0)
Hemoglobin: 9.9 g/dL — ABNORMAL LOW (ref 12.0–15.0)
Hemoglobin: 9.5 g/dL — ABNORMAL LOW (ref 12.0–15.0)
Hemoglobin: 7.1 g/dL — ABNORMAL LOW (ref 12.0–15.0)
Hemoglobin: 7.8 g/dL — ABNORMAL LOW (ref 12.0–15.0)
Hemoglobin: 6.8 g/dL — CL (ref 12.0–15.0)
Hemoglobin: 8.5 g/dL — ABNORMAL LOW (ref 12.0–15.0)
Potassium: 3.9 mmol/L (ref 3.5–5.1)
Potassium: 3.5 mmol/L (ref 3.5–5.1)
Potassium: 3.8 mmol/L (ref 3.5–5.1)
Potassium: 4.9 mmol/L (ref 3.5–5.1)
Potassium: 5 mmol/L (ref 3.5–5.1)
Potassium: 4.4 mmol/L (ref 3.5–5.1)
Potassium: 4.1 mmol/L (ref 3.5–5.1)
Sodium: 142 mmol/L (ref 135–145)
Sodium: 142 mmol/L (ref 135–145)
Sodium: 142 mmol/L (ref 135–145)
Sodium: 139 mmol/L (ref 135–145)
Sodium: 139 mmol/L (ref 135–145)
Sodium: 140 mmol/L (ref 135–145)
Sodium: 142 mmol/L (ref 135–145)
TCO2: 28 mmol/L (ref 22–32)
TCO2: 23 mmol/L (ref 22–32)
TCO2: 26 mmol/L (ref 22–32)
TCO2: 25 mmol/L (ref 22–32)
TCO2: 26 mmol/L (ref 22–32)
TCO2: 25 mmol/L (ref 22–32)
TCO2: 24 mmol/L (ref 22–32)

## 2023-10-06 LAB — POCT I-STAT 7, (LYTES, BLD GAS, ICA,H+H)
Acid-base deficit: 1 mmol/L (ref 0.0–2.0)
Acid-base deficit: 3 mmol/L — ABNORMAL HIGH (ref 0.0–2.0)
Acid-base deficit: 5 mmol/L — ABNORMAL HIGH (ref 0.0–2.0)
Acid-base deficit: 8 mmol/L — ABNORMAL HIGH (ref 0.0–2.0)
Acid-base deficit: 1 mmol/L (ref 0.0–2.0)
Acid-base deficit: 2 mmol/L (ref 0.0–2.0)
Acid-base deficit: 2 mmol/L (ref 0.0–2.0)
Bicarbonate: 24.7 mmol/L (ref 20.0–28.0)
Bicarbonate: 23.5 mmol/L (ref 20.0–28.0)
Bicarbonate: 23 mmol/L (ref 20.0–28.0)
Bicarbonate: 20.6 mmol/L (ref 20.0–28.0)
Bicarbonate: 25.3 mmol/L (ref 20.0–28.0)
Bicarbonate: 23.3 mmol/L (ref 20.0–28.0)
Bicarbonate: 23.9 mmol/L (ref 20.0–28.0)
Calcium, Ion: 0.96 mmol/L — ABNORMAL LOW (ref 1.15–1.40)
Calcium, Ion: 1.24 mmol/L (ref 1.15–1.40)
Calcium, Ion: 1.2 mmol/L (ref 1.15–1.40)
Calcium, Ion: 1.12 mmol/L — ABNORMAL LOW (ref 1.15–1.40)
Calcium, Ion: 1.08 mmol/L — ABNORMAL LOW (ref 1.15–1.40)
Calcium, Ion: 1.16 mmol/L (ref 1.15–1.40)
Calcium, Ion: 1.15 mmol/L (ref 1.15–1.40)
HCT: 22 % — ABNORMAL LOW (ref 36.0–46.0)
HCT: 23 % — ABNORMAL LOW (ref 36.0–46.0)
HCT: 32 % — ABNORMAL LOW (ref 36.0–46.0)
HCT: 28 % — ABNORMAL LOW (ref 36.0–46.0)
HCT: 28 % — ABNORMAL LOW (ref 36.0–46.0)
HCT: 29 % — ABNORMAL LOW (ref 36.0–46.0)
HCT: 29 % — ABNORMAL LOW (ref 36.0–46.0)
Hemoglobin: 7.5 g/dL — ABNORMAL LOW (ref 12.0–15.0)
Hemoglobin: 7.8 g/dL — ABNORMAL LOW (ref 12.0–15.0)
Hemoglobin: 10.9 g/dL — ABNORMAL LOW (ref 12.0–15.0)
Hemoglobin: 9.5 g/dL — ABNORMAL LOW (ref 12.0–15.0)
Hemoglobin: 9.5 g/dL — ABNORMAL LOW (ref 12.0–15.0)
Hemoglobin: 9.9 g/dL — ABNORMAL LOW (ref 12.0–15.0)
Hemoglobin: 9.9 g/dL — ABNORMAL LOW (ref 12.0–15.0)
O2 Saturation: 100 %
O2 Saturation: 96 %
O2 Saturation: 100 %
O2 Saturation: 98 %
O2 Saturation: 99 %
O2 Saturation: 98 %
O2 Saturation: 97 %
Patient temperature: 36.7
Patient temperature: 36.2
Patient temperature: 36
Patient temperature: 36.53
Patient temperature: 36.9
Potassium: 4.4 mmol/L (ref 3.5–5.1)
Potassium: 4.1 mmol/L (ref 3.5–5.1)
Potassium: 4.2 mmol/L (ref 3.5–5.1)
Potassium: 4.3 mmol/L (ref 3.5–5.1)
Potassium: 4.3 mmol/L (ref 3.5–5.1)
Potassium: 4 mmol/L (ref 3.5–5.1)
Potassium: 3.9 mmol/L (ref 3.5–5.1)
Sodium: 141 mmol/L (ref 135–145)
Sodium: 142 mmol/L (ref 135–145)
Sodium: 142 mmol/L (ref 135–145)
Sodium: 144 mmol/L (ref 135–145)
Sodium: 145 mmol/L (ref 135–145)
Sodium: 144 mmol/L (ref 135–145)
Sodium: 144 mmol/L (ref 135–145)
TCO2: 26 mmol/L (ref 22–32)
TCO2: 25 mmol/L (ref 22–32)
TCO2: 25 mmol/L (ref 22–32)
TCO2: 22 mmol/L (ref 22–32)
TCO2: 27 mmol/L (ref 22–32)
TCO2: 25 mmol/L (ref 22–32)
TCO2: 25 mmol/L (ref 22–32)
pCO2 arterial: 47.3 mm[Hg] (ref 32–48)
pCO2 arterial: 53.1 mm[Hg] — ABNORMAL HIGH (ref 32–48)
pCO2 arterial: 54.7 mm[Hg] — ABNORMAL HIGH (ref 32–48)
pCO2 arterial: 55.4 mm[Hg] — ABNORMAL HIGH (ref 32–48)
pCO2 arterial: 46.9 mm[Hg] (ref 32–48)
pCO2 arterial: 41.7 mm[Hg] (ref 32–48)
pCO2 arterial: 42.4 mm[Hg] (ref 32–48)
pH, Arterial: 7.325 — ABNORMAL LOW (ref 7.35–7.45)
pH, Arterial: 7.254 — ABNORMAL LOW (ref 7.35–7.45)
pH, Arterial: 7.229 — ABNORMAL LOW (ref 7.35–7.45)
pH, Arterial: 7.173 — CL (ref 7.35–7.45)
pH, Arterial: 7.334 — ABNORMAL LOW (ref 7.35–7.45)
pH, Arterial: 7.354 (ref 7.35–7.45)
pH, Arterial: 7.359 (ref 7.35–7.45)
pO2, Arterial: 388 mm[Hg] — ABNORMAL HIGH (ref 83–108)
pO2, Arterial: 100 mm[Hg] (ref 83–108)
pO2, Arterial: 243 mm[Hg] — ABNORMAL HIGH (ref 83–108)
pO2, Arterial: 121 mm[Hg] — ABNORMAL HIGH (ref 83–108)
pO2, Arterial: 125 mm[Hg] — ABNORMAL HIGH (ref 83–108)
pO2, Arterial: 116 mm[Hg] — ABNORMAL HIGH (ref 83–108)
pO2, Arterial: 99 mm[Hg] (ref 83–108)

## 2023-10-06 LAB — CBC
HCT: 31.2 % — ABNORMAL LOW (ref 36.0–46.0)
HCT: 29 % — ABNORMAL LOW (ref 36.0–46.0)
Hemoglobin: 10.4 g/dL — ABNORMAL LOW (ref 12.0–15.0)
Hemoglobin: 9.6 g/dL — ABNORMAL LOW (ref 12.0–15.0)
MCH: 30.3 pg (ref 26.0–34.0)
MCH: 29.7 pg (ref 26.0–34.0)
MCHC: 33.3 g/dL (ref 30.0–36.0)
MCHC: 33.1 g/dL (ref 30.0–36.0)
MCV: 91 fL (ref 80.0–100.0)
MCV: 89.8 fL (ref 80.0–100.0)
Platelets: 136 10*3/uL — ABNORMAL LOW (ref 150–400)
Platelets: 130 10*3/uL — ABNORMAL LOW (ref 150–400)
RBC: 3.43 MIL/uL — ABNORMAL LOW (ref 3.87–5.11)
RBC: 3.23 MIL/uL — ABNORMAL LOW (ref 3.87–5.11)
RDW: 13 % (ref 11.5–15.5)
RDW: 13.1 % (ref 11.5–15.5)
WBC: 14.8 10*3/uL — ABNORMAL HIGH (ref 4.0–10.5)
WBC: 9.8 10*3/uL (ref 4.0–10.5)
nRBC: 0 % (ref 0.0–0.2)
nRBC: 0 % (ref 0.0–0.2)

## 2023-10-06 LAB — PROTIME-INR
INR: 1.6 — ABNORMAL HIGH (ref 0.8–1.2)
Prothrombin Time: 19 s — ABNORMAL HIGH (ref 11.4–15.2)

## 2023-10-06 LAB — BASIC METABOLIC PANEL
Anion gap: 9 (ref 5–15)
BUN: 6 mg/dL — ABNORMAL LOW (ref 8–23)
CO2: 23 mmol/L (ref 22–32)
Calcium: 7.9 mg/dL — ABNORMAL LOW (ref 8.9–10.3)
Chloride: 109 mmol/L (ref 98–111)
Creatinine, Ser: 0.74 mg/dL (ref 0.44–1.00)
GFR, Estimated: >60 mL/min (ref >60)
Glucose, Bld: 159 mg/dL — ABNORMAL HIGH (ref 70–99)
Potassium: 4 mmol/L (ref 3.5–5.1)
Sodium: 141 mmol/L (ref 135–145)

## 2023-10-06 LAB — POCT I-STAT EG7
Acid-base deficit: 3 mmol/L — ABNORMAL HIGH (ref 0.0–2.0)
Bicarbonate: 22.7 mmol/L (ref 20.0–28.0)
Calcium, Ion: 0.97 mmol/L — ABNORMAL LOW (ref 1.15–1.40)
HCT: 19 % — ABNORMAL LOW (ref 36.0–46.0)
Hemoglobin: 6.5 g/dL — CL (ref 12.0–15.0)
O2 Saturation: 73 %
Potassium: 5.5 mmol/L — ABNORMAL HIGH (ref 3.5–5.1)
Sodium: 137 mmol/L (ref 135–145)
TCO2: 24 mmol/L (ref 22–32)
pCO2, Ven: 44.8 mm[Hg] (ref 44–60)
pH, Ven: 7.314 (ref 7.25–7.43)
pO2, Ven: 42 mm[Hg] (ref 32–45)

## 2023-10-06 LAB — GLUCOSE, CAPILLARY
Glucose-Capillary: 107 mg/dL — ABNORMAL HIGH (ref 70–99)
Glucose-Capillary: 139 mg/dL — ABNORMAL HIGH (ref 70–99)
Glucose-Capillary: 119 mg/dL — ABNORMAL HIGH (ref 70–99)
Glucose-Capillary: 139 mg/dL — ABNORMAL HIGH (ref 70–99)
Glucose-Capillary: 166 mg/dL — ABNORMAL HIGH (ref 70–99)
Glucose-Capillary: 149 mg/dL — ABNORMAL HIGH (ref 70–99)
Glucose-Capillary: 162 mg/dL — ABNORMAL HIGH (ref 70–99)
Glucose-Capillary: 132 mg/dL — ABNORMAL HIGH (ref 70–99)

## 2023-10-06 LAB — PREPARE RBC (CROSSMATCH)

## 2023-10-06 LAB — HEMOGLOBIN AND HEMATOCRIT, BLOOD
HCT: 21.5 % — ABNORMAL LOW (ref 36.0–46.0)
Hemoglobin: 7.2 g/dL — ABNORMAL LOW (ref 12.0–15.0)

## 2023-10-06 LAB — PLATELET COUNT: Platelets: 172 10*3/uL (ref 150–400)

## 2023-10-06 LAB — MAGNESIUM: Magnesium: 3.3 mg/dL — ABNORMAL HIGH (ref 1.7–2.4)

## 2023-10-06 LAB — APTT: aPTT: 33 s (ref 24–36)

## 2023-10-06 LAB — ABO/RH: ABO/RH(D): O POS

## 2023-10-06 SURGERY — CORONARY ARTERY BYPASS GRAFTING (CABG)
Anesthesia: General | Site: Chest

## 2023-10-06 MED ORDER — DEXTROSE 50 % IV SOLN: 0.0000 mL | INTRAVENOUS | Status: DC | PRN

## 2023-10-06 MED ORDER — MIDAZOLAM HCL 2 MG/2ML IJ SOLN
INTRAMUSCULAR | Status: AC
Start: 2023-10-06 — End: ?
  Filled 2023-10-06: qty 2

## 2023-10-06 MED ORDER — EPHEDRINE SULFATE (PRESSORS) 50 MG/ML IJ SOLN
INTRAMUSCULAR | Status: DC | PRN
  Administered 2023-10-06 (×2): 2.5 mg via INTRAVENOUS

## 2023-10-06 MED ORDER — PANTOPRAZOLE SODIUM 40 MG IV SOLR
40.0000 mg | Freq: Every day | INTRAVENOUS | Status: AC
  Administered 2023-10-06 – 2023-10-07 (×2): 40 mg via INTRAVENOUS
  Filled 2023-10-06 (×2): qty 10

## 2023-10-06 MED ORDER — PLASMA-LYTE A IV SOLN: INTRAVENOUS | Status: DC | PRN

## 2023-10-06 MED ORDER — MORPHINE SULFATE (PF) 2 MG/ML IV SOLN
1.0000 mg | INTRAVENOUS | Status: DC | PRN
  Administered 2023-10-06: 1 mg via INTRAVENOUS
  Administered 2023-10-06 – 2023-10-07 (×3): 2 mg via INTRAVENOUS
  Administered 2023-10-07: 4 mg via INTRAVENOUS
  Administered 2023-10-07: 2 mg via INTRAVENOUS
  Administered 2023-10-07 – 2023-10-08 (×3): 4 mg via INTRAVENOUS
  Filled 2023-10-06: qty 2
  Filled 2023-10-06: qty 1
  Filled 2023-10-06: qty 2
  Filled 2023-10-06 (×3): qty 1
  Filled 2023-10-06 (×2): qty 2
  Filled 2023-10-06: qty 1

## 2023-10-06 MED ORDER — METOPROLOL TARTRATE 12.5 MG HALF TABLET
12.5000 mg | ORAL_TABLET | Freq: Two times a day (BID) | ORAL | Status: DC
  Administered 2023-10-07 – 2023-10-10 (×7): 12.5 mg via ORAL
  Filled 2023-10-06 (×7): qty 1

## 2023-10-06 MED ORDER — REVEFENACIN 175 MCG/3ML IN SOLN
175.0000 ug | Freq: Every day | RESPIRATORY_TRACT | Status: DC
  Administered 2023-10-06 – 2023-10-18 (×12): 175 ug via RESPIRATORY_TRACT
  Filled 2023-10-06 (×13): qty 3

## 2023-10-06 MED ORDER — PRESERVISION AREDS 2 PO CAPS: 1.0000 | ORAL_CAPSULE | Freq: Two times a day (BID) | ORAL | Status: DC

## 2023-10-06 MED ORDER — PANTOPRAZOLE SODIUM 40 MG PO TBEC
40.0000 mg | DELAYED_RELEASE_TABLET | Freq: Every day | ORAL | Status: DC
  Administered 2023-10-08 – 2023-10-18 (×11): 40 mg via ORAL
  Filled 2023-10-06 (×11): qty 1

## 2023-10-06 MED ORDER — PHENYLEPHRINE 80 MCG/ML (10ML) SYRINGE FOR IV PUSH (FOR BLOOD PRESSURE SUPPORT)
PREFILLED_SYRINGE | INTRAVENOUS | Status: DC | PRN
  Administered 2023-10-06: 40 ug via INTRAVENOUS
  Administered 2023-10-06 (×2): 80 ug via INTRAVENOUS
  Administered 2023-10-06 (×2): 40 ug via INTRAVENOUS
  Administered 2023-10-06 (×2): 80 ug via INTRAVENOUS
  Administered 2023-10-06: 40 ug via INTRAVENOUS
  Administered 2023-10-06: 120 ug via INTRAVENOUS
  Administered 2023-10-06: 40 ug via INTRAVENOUS
  Administered 2023-10-06: 200 ug via INTRAVENOUS
  Administered 2023-10-06: 80 ug via INTRAVENOUS
  Administered 2023-10-06: 40 ug via INTRAVENOUS

## 2023-10-06 MED ORDER — FENTANYL CITRATE (PF) 250 MCG/5ML IJ SOLN
INTRAMUSCULAR | Status: AC
  Filled 2023-10-06: qty 5

## 2023-10-06 MED ORDER — LACTATED RINGERS IV SOLN: INTRAVENOUS | Status: AC

## 2023-10-06 MED ORDER — SODIUM CHLORIDE 0.9% FLUSH: 3.0000 mL | INTRAVENOUS | Status: DC | PRN

## 2023-10-06 MED ORDER — SODIUM CHLORIDE 0.45 % IV SOLN: INTRAVENOUS | Status: AC | PRN

## 2023-10-06 MED ORDER — BISACODYL 10 MG RE SUPP: 10.0000 mg | Freq: Every day | RECTAL | Status: DC

## 2023-10-06 MED ORDER — ROCURONIUM BROMIDE 10 MG/ML (PF) SYRINGE
PREFILLED_SYRINGE | INTRAVENOUS | Status: DC | PRN
  Administered 2023-10-06: 50 mg via INTRAVENOUS
  Administered 2023-10-06: 10 mg via INTRAVENOUS
  Administered 2023-10-06: 100 mg via INTRAVENOUS
  Administered 2023-10-06: 30 mg via INTRAVENOUS

## 2023-10-06 MED ORDER — ARFORMOTEROL TARTRATE 15 MCG/2ML IN NEBU
15.0000 ug | INHALATION_SOLUTION | Freq: Two times a day (BID) | RESPIRATORY_TRACT | Status: DC
  Administered 2023-10-06 – 2023-10-18 (×23): 15 ug via RESPIRATORY_TRACT
  Filled 2023-10-06 (×24): qty 2

## 2023-10-06 MED ORDER — FENTANYL CITRATE (PF) 250 MCG/5ML IJ SOLN
INTRAMUSCULAR | Status: DC | PRN
  Administered 2023-10-06: 50 ug via INTRAVENOUS
  Administered 2023-10-06: 100 ug via INTRAVENOUS
  Administered 2023-10-06 (×3): 50 ug via INTRAVENOUS
  Administered 2023-10-06: 200 ug via INTRAVENOUS
  Administered 2023-10-06 (×2): 50 ug via INTRAVENOUS

## 2023-10-06 MED ORDER — CHLORHEXIDINE GLUCONATE CLOTH 2 % EX PADS
6.0000 | MEDICATED_PAD | Freq: Every day | CUTANEOUS | Status: DC
  Administered 2023-10-06 – 2023-10-18 (×13): 6 via TOPICAL

## 2023-10-06 MED ORDER — ORAL CARE MOUTH RINSE: 15.0000 mL | OROMUCOSAL | Status: DC | PRN

## 2023-10-06 MED ORDER — SODIUM CHLORIDE (PF) 0.9 % IJ SOLN: OROMUCOSAL | Status: DC | PRN

## 2023-10-06 MED ORDER — METOCLOPRAMIDE HCL 5 MG/ML IJ SOLN
10.0000 mg | Freq: Four times a day (QID) | INTRAMUSCULAR | Status: AC
  Administered 2023-10-06 – 2023-10-07 (×6): 10 mg via INTRAVENOUS
  Filled 2023-10-06 (×6): qty 2

## 2023-10-06 MED ORDER — METOPROLOL TARTRATE 12.5 MG HALF TABLET
12.5000 mg | ORAL_TABLET | Freq: Once | ORAL | Status: AC
  Administered 2023-10-06: 12.5 mg via ORAL
  Filled 2023-10-06: qty 1

## 2023-10-06 MED ORDER — ALBUMIN HUMAN 5 % IV SOLN: INTRAVENOUS | Status: DC | PRN

## 2023-10-06 MED ORDER — DIPHENHYDRAMINE HCL 25 MG PO CAPS
50.0000 mg | ORAL_CAPSULE | Freq: Every evening | ORAL | Status: DC | PRN
  Filled 2023-10-06: qty 2

## 2023-10-06 MED ORDER — DOCUSATE SODIUM 100 MG PO CAPS
200.0000 mg | ORAL_CAPSULE | Freq: Every day | ORAL | Status: DC
  Administered 2023-10-07 – 2023-10-16 (×6): 200 mg via ORAL
  Filled 2023-10-06 (×7): qty 2

## 2023-10-06 MED ORDER — ASPIRIN 325 MG PO TBEC
325.0000 mg | DELAYED_RELEASE_TABLET | Freq: Every day | ORAL | Status: DC
Start: 2023-10-07 — End: 2023-10-18
  Administered 2023-10-07 – 2023-10-18 (×10): 325 mg via ORAL
  Filled 2023-10-06 (×11): qty 1

## 2023-10-06 MED ORDER — TRAMADOL HCL 50 MG PO TABS
50.0000 mg | ORAL_TABLET | Freq: Four times a day (QID) | ORAL | Status: DC | PRN
  Administered 2023-10-06 – 2023-10-08 (×3): 50 mg via ORAL
  Filled 2023-10-06 (×3): qty 1

## 2023-10-06 MED ORDER — BISACODYL 5 MG PO TBEC
10.0000 mg | DELAYED_RELEASE_TABLET | Freq: Every day | ORAL | Status: DC
Start: 2023-10-07 — End: 2023-10-18
  Administered 2023-10-07 – 2023-10-16 (×6): 10 mg via ORAL
  Filled 2023-10-06 (×7): qty 2

## 2023-10-06 MED ORDER — CHLORHEXIDINE GLUCONATE 0.12 % MT SOLN
15.0000 mL | Freq: Once | OROMUCOSAL | Status: AC
Start: 2023-10-07 — End: 2023-10-06
  Administered 2023-10-06: 15 mL via OROMUCOSAL
  Filled 2023-10-06: qty 15

## 2023-10-06 MED ORDER — LACTATED RINGERS IV SOLN
INTRAVENOUS | Status: AC
Start: 2023-10-06 — End: 2023-10-07

## 2023-10-06 MED ORDER — LEVOFLOXACIN IN D5W 750 MG/150ML IV SOLN
750.0000 mg | INTRAVENOUS | Status: AC
  Administered 2023-10-07: 750 mg via INTRAVENOUS
  Filled 2023-10-06: qty 150

## 2023-10-06 MED ORDER — PROPOFOL 10 MG/ML IV BOLUS
INTRAVENOUS | Status: AC
  Filled 2023-10-06: qty 20

## 2023-10-06 MED ORDER — VANCOMYCIN HCL IN DEXTROSE 1-5 GM/200ML-% IV SOLN
1000.0000 mg | Freq: Once | INTRAVENOUS | Status: AC
  Administered 2023-10-06: 1000 mg via INTRAVENOUS
  Filled 2023-10-06: qty 200

## 2023-10-06 MED ORDER — NITROGLYCERIN IN D5W 200-5 MCG/ML-% IV SOLN: 0.0000 ug/min | INTRAVENOUS | Status: DC

## 2023-10-06 MED ORDER — EPINEPHRINE HCL 5 MG/250ML IV SOLN IN NS: 0.0000 ug/min | INTRAVENOUS | Status: DC

## 2023-10-06 MED ORDER — METOPROLOL TARTRATE 5 MG/5ML IV SOLN
2.5000 mg | INTRAVENOUS | Status: DC | PRN
  Administered 2023-10-10 (×2): 2.5 mg via INTRAVENOUS
  Filled 2023-10-06 (×2): qty 5

## 2023-10-06 MED ORDER — ACETAMINOPHEN 500 MG PO TABS
1000.0000 mg | ORAL_TABLET | Freq: Every evening | ORAL | Status: DC | PRN
  Administered 2023-10-11 – 2023-10-12 (×3): 1000 mg via ORAL
  Filled 2023-10-06 (×3): qty 2

## 2023-10-06 MED ORDER — ACETAMINOPHEN 160 MG/5ML PO SOLN
1000.0000 mg | Freq: Four times a day (QID) | ORAL | Status: AC
  Administered 2023-10-08: 1000 mg

## 2023-10-06 MED ORDER — VASOPRESSIN 20 UNIT/ML IV SOLN
INTRAVENOUS | Status: AC
  Filled 2023-10-06: qty 1

## 2023-10-06 MED ORDER — CHLORHEXIDINE GLUCONATE 4 % EX SOLN: 30.0000 mL | CUTANEOUS | Status: DC

## 2023-10-06 MED ORDER — DEXMEDETOMIDINE HCL IN NACL 400 MCG/100ML IV SOLN
0.0000 ug/kg/h | INTRAVENOUS | Status: DC
  Administered 2023-10-06: 0.4 ug/kg/h via INTRAVENOUS
  Filled 2023-10-06: qty 100

## 2023-10-06 MED ORDER — PHENYLEPHRINE 80 MCG/ML (10ML) SYRINGE FOR IV PUSH (FOR BLOOD PRESSURE SUPPORT)
PREFILLED_SYRINGE | INTRAVENOUS | Status: AC
  Filled 2023-10-06: qty 10

## 2023-10-06 MED ORDER — SODIUM CHLORIDE 0.9% FLUSH
3.0000 mL | Freq: Two times a day (BID) | INTRAVENOUS | Status: DC
  Administered 2023-10-07 – 2023-10-11 (×9): 3 mL via INTRAVENOUS

## 2023-10-06 MED ORDER — CALCIUM GLUCONATE-NACL 1-0.675 GM/50ML-% IV SOLN
1.0000 g | Freq: Once | INTRAVENOUS | Status: AC
  Administered 2023-10-06: 1000 mg via INTRAVENOUS
  Filled 2023-10-06: qty 50

## 2023-10-06 MED ORDER — POTASSIUM CHLORIDE 10 MEQ/50ML IV SOLN: 10.0000 meq | INTRAVENOUS | Status: AC

## 2023-10-06 MED ORDER — INSULIN REGULAR(HUMAN) IN NACL 100-0.9 UT/100ML-% IV SOLN
INTRAVENOUS | Status: DC
  Administered 2023-10-06: 4 [IU]/h via INTRAVENOUS

## 2023-10-06 MED ORDER — NOREPINEPHRINE 4 MG/250ML-% IV SOLN
0.0000 ug/min | INTRAVENOUS | Status: DC
  Administered 2023-10-06: 6 ug/min via INTRAVENOUS
  Administered 2023-10-07: 7 ug/min via INTRAVENOUS
  Filled 2023-10-06 (×2): qty 250

## 2023-10-06 MED ORDER — MAGNESIUM SULFATE 4 GM/100ML IV SOLN
4.0000 g | Freq: Once | INTRAVENOUS | Status: AC
  Administered 2023-10-06: 4 g via INTRAVENOUS
  Filled 2023-10-06: qty 100

## 2023-10-06 MED ORDER — ASPIRIN 81 MG PO CHEW
324.0000 mg | CHEWABLE_TABLET | Freq: Every day | ORAL | Status: DC
  Administered 2023-10-13 – 2023-10-15 (×2): 324 mg
  Filled 2023-10-06 (×2): qty 4

## 2023-10-06 MED ORDER — HEPARIN SODIUM (PORCINE) 1000 UNIT/ML IJ SOLN
INTRAMUSCULAR | Status: DC | PRN
  Administered 2023-10-06: 27000 [IU] via INTRAVENOUS

## 2023-10-06 MED ORDER — ~~LOC~~ CARDIAC SURGERY, PATIENT & FAMILY EDUCATION
Freq: Once | Status: DC
  Filled 2023-10-06: qty 1

## 2023-10-06 MED ORDER — BUDESONIDE 0.5 MG/2ML IN SUSP
0.5000 mg | Freq: Two times a day (BID) | RESPIRATORY_TRACT | Status: DC
  Administered 2023-10-06 – 2023-10-18 (×23): 0.5 mg via RESPIRATORY_TRACT
  Filled 2023-10-06 (×24): qty 2

## 2023-10-06 MED ORDER — ASPIRIN 81 MG PO CHEW
324.0000 mg | CHEWABLE_TABLET | Freq: Once | ORAL | Status: AC
  Administered 2023-10-06: 324 mg via ORAL
  Filled 2023-10-06: qty 4

## 2023-10-06 MED ORDER — METOPROLOL TARTRATE 25 MG/10 ML ORAL SUSPENSION: 12.5000 mg | Freq: Two times a day (BID) | ORAL | Status: DC

## 2023-10-06 MED ORDER — MIDAZOLAM HCL 2 MG/2ML IJ SOLN
2.0000 mg | INTRAMUSCULAR | Status: DC | PRN
  Administered 2023-10-06: 2 mg via INTRAVENOUS
  Filled 2023-10-06: qty 2

## 2023-10-06 MED ORDER — SODIUM CHLORIDE 0.9 % IV SOLN: 250.0000 mL | INTRAVENOUS | Status: AC

## 2023-10-06 MED ORDER — ALBUMIN HUMAN 5 % IV SOLN
250.0000 mL | INTRAVENOUS | Status: AC | PRN
  Administered 2023-10-06 – 2023-10-07 (×5): 12.5 g via INTRAVENOUS
  Filled 2023-10-06 (×3): qty 250

## 2023-10-06 MED ORDER — PROTAMINE SULFATE 10 MG/ML IV SOLN
INTRAVENOUS | Status: AC
  Filled 2023-10-06: qty 25

## 2023-10-06 MED ORDER — MIDAZOLAM HCL (PF) 5 MG/ML IJ SOLN
INTRAMUSCULAR | Status: DC | PRN
  Administered 2023-10-06: 1 mg via INTRAVENOUS
  Administered 2023-10-06: 2 mg via INTRAVENOUS
  Administered 2023-10-06 (×2): 1 mg via INTRAVENOUS

## 2023-10-06 MED ORDER — PROPOFOL 10 MG/ML IV BOLUS
INTRAVENOUS | Status: DC | PRN
  Administered 2023-10-06: 70 mg via INTRAVENOUS
  Administered 2023-10-06: 10 mg via INTRAVENOUS
  Administered 2023-10-06: 20 mg via INTRAVENOUS
  Administered 2023-10-06: 30 mg via INTRAVENOUS

## 2023-10-06 MED ORDER — NICARDIPINE HCL IN NACL 20-0.86 MG/200ML-% IV SOLN: 0.0000 mg/h | INTRAVENOUS | Status: DC

## 2023-10-06 MED ORDER — SODIUM CHLORIDE 0.9 % IV SOLN: INTRAVENOUS | Status: DC | PRN

## 2023-10-06 MED ORDER — SODIUM CHLORIDE 0.9 % IV SOLN: INTRAVENOUS | Status: AC

## 2023-10-06 MED ORDER — ALBUTEROL SULFATE (2.5 MG/3ML) 0.083% IN NEBU
2.5000 mg | INHALATION_SOLUTION | RESPIRATORY_TRACT | Status: DC | PRN
  Administered 2023-10-06: 2.5 mg via RESPIRATORY_TRACT
  Filled 2023-10-06 (×2): qty 3

## 2023-10-06 MED ORDER — ROCURONIUM BROMIDE 10 MG/ML (PF) SYRINGE
PREFILLED_SYRINGE | INTRAVENOUS | Status: AC
  Filled 2023-10-06: qty 10

## 2023-10-06 MED ORDER — DIPHENHYDRAMINE-APAP (SLEEP) 25-500 MG PO TABS: 2.0000 | ORAL_TABLET | Freq: Every day | ORAL | Status: DC

## 2023-10-06 MED ORDER — LACTATED RINGERS IV SOLN: INTRAVENOUS | Status: DC | PRN

## 2023-10-06 MED ORDER — PROTAMINE SULFATE 10 MG/ML IV SOLN
INTRAVENOUS | Status: DC | PRN
  Administered 2023-10-06: 30 mg via INTRAVENOUS
  Administered 2023-10-06 (×2): 50 mg via INTRAVENOUS
  Administered 2023-10-06 (×2): 30 mg via INTRAVENOUS
  Administered 2023-10-06 (×2): 40 mg via INTRAVENOUS

## 2023-10-06 MED ORDER — PROTAMINE SULFATE 10 MG/ML IV SOLN
INTRAVENOUS | Status: AC
  Filled 2023-10-06: qty 5

## 2023-10-06 MED ORDER — SODIUM BICARBONATE 8.4 % IV SOLN
100.0000 meq | Freq: Once | INTRAVENOUS | Status: AC
  Administered 2023-10-06: 100 meq via INTRAVENOUS

## 2023-10-06 MED ORDER — ACETAMINOPHEN 500 MG PO TABS
1000.0000 mg | ORAL_TABLET | Freq: Four times a day (QID) | ORAL | Status: AC
  Administered 2023-10-06 – 2023-10-09 (×11): 1000 mg via ORAL
  Filled 2023-10-06 (×14): qty 2

## 2023-10-06 MED ORDER — LIDOCAINE 2% (20 MG/ML) 5 ML SYRINGE
INTRAMUSCULAR | Status: AC
  Filled 2023-10-06: qty 5

## 2023-10-06 MED ORDER — OXYCODONE HCL 5 MG PO TABS
5.0000 mg | ORAL_TABLET | ORAL | Status: DC | PRN
  Administered 2023-10-06 – 2023-10-09 (×9): 10 mg via ORAL
  Administered 2023-10-09 (×2): 5 mg via ORAL
  Administered 2023-10-09: 10 mg via ORAL
  Filled 2023-10-06: qty 1
  Filled 2023-10-06 (×2): qty 2
  Filled 2023-10-06: qty 1
  Filled 2023-10-06 (×8): qty 2

## 2023-10-06 MED ORDER — ACETAMINOPHEN 160 MG/5ML PO SOLN
650.0000 mg | Freq: Once | ORAL | Status: AC
  Administered 2023-10-06: 650 mg
  Filled 2023-10-06: qty 20.3

## 2023-10-06 MED ORDER — ONDANSETRON HCL 4 MG/2ML IJ SOLN
4.0000 mg | Freq: Four times a day (QID) | INTRAMUSCULAR | Status: DC | PRN
  Administered 2023-10-08 – 2023-10-09 (×2): 4 mg via INTRAVENOUS
  Filled 2023-10-06 (×3): qty 2

## 2023-10-06 MED ORDER — CHLORHEXIDINE GLUCONATE 0.12 % MT SOLN
15.0000 mL | OROMUCOSAL | Status: AC
  Administered 2023-10-06: 15 mL via OROMUCOSAL
  Filled 2023-10-06: qty 15

## 2023-10-06 MED ORDER — ROSUVASTATIN CALCIUM 20 MG PO TABS
40.0000 mg | ORAL_TABLET | Freq: Every day | ORAL | Status: DC
  Administered 2023-10-07 – 2023-10-18 (×12): 40 mg via ORAL
  Filled 2023-10-06 (×12): qty 2

## 2023-10-06 MED ORDER — 0.9 % SODIUM CHLORIDE (POUR BTL) OPTIME
TOPICAL | Status: DC | PRN
  Administered 2023-10-06: 5000 mL

## 2023-10-06 SURGICAL SUPPLY — 76 items
BAG DECANTER FOR FLEXI CONT (MISCELLANEOUS) ×2 IMPLANT
BLADE CLIPPER SURG (BLADE) ×2 IMPLANT
BLADE STERNUM SYSTEM 6 (BLADE) ×2 IMPLANT
BNDG ELASTIC 4INX 5YD STR LF (GAUZE/BANDAGES/DRESSINGS) IMPLANT
BNDG ELASTIC 6INX 5YD STR LF (GAUZE/BANDAGES/DRESSINGS) IMPLANT
BNDG GAUZE DERMACEA FLUFF 4 (GAUZE/BANDAGES/DRESSINGS) ×2 IMPLANT
CANISTER SUCT 3000ML PPV (MISCELLANEOUS) ×2 IMPLANT
CANNULA MC2 2 STG 29/37 NON-V (CANNULA) ×2 IMPLANT
CANNULA NON VENT 20FR 12 (CANNULA) ×2 IMPLANT
CANNULA VESSEL 3MM BLUNT TIP (CANNULA) IMPLANT
CATH ROBINSON RED A/P 18FR (CATHETERS) ×4 IMPLANT
CLIP FOGARTY SPRING 6M (CLIP) IMPLANT
CLIP TI WIDE RED SMALL 24 (CLIP) IMPLANT
CONNECTOR BLAKE 2:1 CARIO BLK (MISCELLANEOUS) ×2 IMPLANT
CONTAINER PROTECT SURGISLUSH (MISCELLANEOUS) ×4 IMPLANT
DERMABOND ADVANCED .7 DNX12 (GAUZE/BANDAGES/DRESSINGS) IMPLANT
DRAIN CHANNEL 19F RND (DRAIN) ×6 IMPLANT
DRAIN CONNECTOR BLAKE 1:1 (MISCELLANEOUS) ×2 IMPLANT
DRAPE SRG 135X102X78XABS (DRAPES) ×2 IMPLANT
DRAPE WARM FLUID 44X44 (DRAPES) ×2 IMPLANT
DRESSING PEEL AND PLAC PRVNA20 (GAUZE/BANDAGES/DRESSINGS) IMPLANT
DRSG AQUACEL AG ADV 3.5X10 (GAUZE/BANDAGES/DRESSINGS) ×2 IMPLANT
DRSG PEEL AND PLACE PREVENA 20 (GAUZE/BANDAGES/DRESSINGS) ×2
ELECT BLADE 4.0 EZ CLEAN MEGAD (MISCELLANEOUS) ×2
ELECT REM PT RETURN 9FT ADLT (ELECTROSURGICAL) ×4
ELECTRODE BLDE 4.0 EZ CLN MEGD (MISCELLANEOUS) ×2 IMPLANT
ELECTRODE REM PT RTRN 9FT ADLT (ELECTROSURGICAL) ×4 IMPLANT
FELT TEFLON 1X6 (MISCELLANEOUS) ×4 IMPLANT
GAUZE 4X4 16PLY ~~LOC~~+RFID DBL (SPONGE) ×2 IMPLANT
GAUZE SPONGE 4X4 12PLY STRL (GAUZE/BANDAGES/DRESSINGS) ×4 IMPLANT
GLOVE BIO SURGEON STRL SZ 6 (GLOVE) IMPLANT
GLOVE BIOGEL M STRL SZ7.5 (GLOVE) ×4 IMPLANT
GLOVE BIOGEL PI IND STRL 6.5 (GLOVE) IMPLANT
GLOVE SS BIOGEL STRL SZ 6 (GLOVE) IMPLANT
GOWN STRL REUS W/ TWL LRG LVL3 (GOWN DISPOSABLE) ×8 IMPLANT
GOWN STRL REUS W/ TWL XL LVL3 (GOWN DISPOSABLE) ×4 IMPLANT
HEMOSTAT POWDER SURGIFOAM 1G (HEMOSTASIS) ×4 IMPLANT
INSERT SUTURE HOLDER (MISCELLANEOUS) ×2 IMPLANT
KIT BASIN OR (CUSTOM PROCEDURE TRAY) ×2 IMPLANT
KIT TURNOVER KIT B (KITS) ×2 IMPLANT
KIT VASOVIEW HEMOPRO 2 VH 4000 (KITS) ×2 IMPLANT
LEAD PACING MYOCARDI (MISCELLANEOUS) ×2 IMPLANT
MARKER GRAFT CORONARY BYPASS (MISCELLANEOUS) ×6 IMPLANT
NS IRRIG 1000ML POUR BTL (IV SOLUTION) ×10 IMPLANT
OFFPUMP STABILIZER SUV (MISCELLANEOUS) IMPLANT
PACK E OPEN HEART (SUTURE) ×2 IMPLANT
PACK OPEN HEART (CUSTOM PROCEDURE TRAY) ×2 IMPLANT
PAD ELECT DEFIB RADIOL ZOLL (MISCELLANEOUS) ×2 IMPLANT
PENCIL BUTTON HOLSTER BLD 10FT (ELECTRODE) ×2 IMPLANT
POSITIONER ACROBAT-I OFFPUMP (MISCELLANEOUS) IMPLANT
POSITIONER HEAD DONUT 9IN (MISCELLANEOUS) ×2 IMPLANT
PUNCH AORTIC ROTATE 4.0MM (MISCELLANEOUS) ×2 IMPLANT
SET MPS 3-ND DEL (MISCELLANEOUS) IMPLANT
SPONGE T-LAP 18X18 ~~LOC~~+RFID (SPONGE) ×8 IMPLANT
SUPPORT HEART JANKE-BARRON (MISCELLANEOUS) ×2 IMPLANT
SUT ETHIBOND X763 2 0 SH 1 (SUTURE) ×4 IMPLANT
SUT MNCRL AB 3-0 PS2 18 (SUTURE) ×4 IMPLANT
SUT MNCRL AB 4-0 PS2 18 (SUTURE) IMPLANT
SUT PDS AB 1 CTX 36 (SUTURE) ×4 IMPLANT
SUT PROLENE 4 0 SH DA (SUTURE) ×2 IMPLANT
SUT PROLENE 4-0 RB1 .5 CRCL 36 (SUTURE) IMPLANT
SUT PROLENE 5 0 C 1 36 (SUTURE) ×6 IMPLANT
SUT PROLENE 6 0 C 1 30 (SUTURE) IMPLANT
SUT PROLENE 7 0 BV 1 (SUTURE) IMPLANT
SUT PROLENE 7 0 BV1 MDA (SUTURE) ×2 IMPLANT
SUT STEEL 6MS V (SUTURE) ×4 IMPLANT
SUT VIC AB 2-0 CT1 TAPERPNT 27 (SUTURE) IMPLANT
SYSTEM SAHARA CHEST DRAIN ATS (WOUND CARE) ×2 IMPLANT
TAPE CLOTH SURG 4X10 WHT LF (GAUZE/BANDAGES/DRESSINGS) IMPLANT
TAPE PAPER 2X10 WHT MICROPORE (GAUZE/BANDAGES/DRESSINGS) IMPLANT
TOWEL GREEN STERILE (TOWEL DISPOSABLE) ×2 IMPLANT
TOWEL GREEN STERILE FF (TOWEL DISPOSABLE) ×2 IMPLANT
TRAY FOLEY SLVR 14FR TEMP STAT (SET/KITS/TRAYS/PACK) IMPLANT
TUBING LAP HI FLOW INSUFFLATIO (TUBING) ×2 IMPLANT
UNDERPAD 30X36 HEAVY ABSORB (UNDERPADS AND DIAPERS) ×2 IMPLANT
WATER STERILE IRR 1000ML POUR (IV SOLUTION) ×4 IMPLANT

## 2023-10-06 NOTE — Consult Note (Signed)
NAME:  Megan Pham, MRN:  [id number], DOB:  December 20, 1945, LOS: 0 ADMISSION DATE:  10/06/2023, CONSULTATION DATE:  1/27 REFERRING MD:  Cliffton Asters, CHIEF COMPLAINT:  post-CABG   History of Present Illness:  78 year old female with PMH as below, which is significant for carotid stenosis, HTN, HLD, and CAD. Physical activity has become limited by dyspnea over a period of a few months. She became unable to walk to and from her mailbox without significant dyspnea. Also complained of exertional chest tightness intermittently. She underwent coronary CT screening for CAD with her cardiologist with concerning findings. She was referred for LHC demonstrated triple vessel disease including CTO RCA; no LM disease. Echo demonstrated preserved ejection fraction. She was referred to thoracic surgery and was felt to have good distal targets for bypass grafting. She presented to [facility] for CABG 1/27. Surgery was tolerated well with no immediate complications and the patient was transferred to the CVICU postoperatively for ongoing care. PCCM consulted for medical and ventilator management.   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   Significant Hospital Events: Including procedures, antibiotic start and stop dates in addition to other pertinent events   1/27 CABG x 4. LIMA LAD, RSVG PDA, OM1, OM2   Interim History / Subjective:    Objective   Blood pressure (!) 138/90, pulse 75, temperature 98 F (36.7 C), temperature source Oral, resp. rate 17, height 5' (1.524 m), weight 77.2 kg, SpO2 97%.        Intake/Output Summary (Last 24  hours) at 10/06/2023 1317 Last data filed at 10/06/2023 1300 Gross per 24 hour  Intake 2580 ml  Output 1700 ml  Net 880 ml   Filed Weights   10/06/23 6962  Weight: 77.2 kg    Examination: General: elderly female in NAD on vent HENT: [place]/AT, PERRL, no JVD Lungs: Diminished, no wheeze Cardiovascular: RRR, no MRG Abdomen: Soft, NT, ND Extremities: No  acute deformity. Palpable distal pulses.  Neuro: Sedated.  GU: Foley  EKG: NSR rate 78 1st degree AV block.  CXR personally reviewed> ETT in good position. No airspace disease. L apical PTX small by rad read.   CABG: EBL:  Blood Administration: 1 unit pRBCs Xclamp Time:  62 min Pump Time:    Drains: 24 F blake drain:  L, mediastinal  Wires: ventricular Counts: correct  Resolved Hospital Problem list     Assessment & Plan:   3-vessel CAD with NSTEMI; LVEF 60-65% S/p CABG x 4 -post-op care per TCTS -complete post op antibiotics -pain control per protocol- morphine, tramadol, oxycodone -aspirin, statin daily -start metoprolol once weaned off vasopressors  Post-op vent management Hypercarbia: former smoker, ? Undiagnosed COPD -ABG reviewed and settings adjusted > improved -Back on track for rapid wean -VAP prevention protocol -Adding triple neb therapy and PRN albuterol.  -pulmonary hygiene - Precedex for RASS goal 0 to -1.  Vasoplegia status-post cardiac surgery - Hemodynamic monitoring - epi, phenylephrine titrated to MAP goal 65 mmHg - MIVF  Hyperglycemia; h/o DM. A1c 6 -insulin gtt per protocol  ABLA- expected post-op Consumptive thrombocytopenia- expected post-op Consumptive coagulopathy- expected post-op - Close hemodynamic and chest tube output monitoring - TXA infusing per protocol - Trend H&H -  Transfuse per usual ICU guidelines.   Hypertension - holding home medications   Best Practice (right click and "Reselect all SmartList Selections" daily)   Diet/type: NPO; progress as  tolerated DVT prophylaxis SCD Pressure ulcer(s): N/A GI prophylaxis: H2B and PPI Lines: Central line, Arterial Line, and yes and it is still needed Foley:  Yes, and it is still needed Code Status:  full code Last date of multidisciplinary goals of care discussion [ ]   Labs   CBC: Recent Labs  Lab 10/02/23 1500 10/06/23 0800 10/06/23 1117 10/06/23 1126 10/06/23 1214 10/06/23 1246 10/06/23 1249  WBC 7.6  --   --   --   --   --   --   HGB 13.1   < > 7.2* 7.1* 6.8* 7.8* 8.5*  HCT 41.1   < > 21.5* 21.0* 20.0* 23.0* 25.0*  MCV 91.7  --   --   --   --   --   --   PLT 324  --  172  --   --   --   --    < > = values in this interval not displayed.    Basic Metabolic Panel: Recent Labs  Lab 10/02/23 1500 10/06/23 0800 10/06/23 0951 10/06/23 1027 10/06/23 1057 10/06/23 1126 10/06/23 1214 10/06/23 1246 10/06/23 1249  NA 137   < > 142   < > 139 139 140 142 142  K 4.7   < > 3.8   < > 5.0 4.9 4.4 4.1 4.1  CL 103   < > 106  --  102 104 105  --  108  CO2 26  --   --   --   --   --   --   --   --   GLUCOSE 118*   < > 116*  --  154* 164* 154*  --  144*  BUN 13   < > 8  --  8 8 7*  --  8  CREATININE 0.68   < > 0.60  --  0.50 0.50 0.40*  --  0.50  CALCIUM 9.4  --   --   --   --   --   --   --   --    < > = values in this interval not displayed.   GFR: Estimated Creatinine Clearance: 54.1 mL/min (by C-G formula based on SCr of 0.5 mg/dL). Recent Labs  Lab 10/02/23 1500  WBC 7.6    Liver Function Tests: Recent Labs  Lab 10/02/23 1500  AST 25  ALT 24  ALKPHOS 102  BILITOT 0.5  PROT 7.6  ALBUMIN 4.1   No results for input(s): "LIPASE", "AMYLASE" in the last 168 hours. No results for input(s): "AMMONIA" in the last 168 hours.  ABG    Component Value Date/Time   PHART 7.254 (L) 10/06/2023 1246   PCO2ART 53.1 (H) 10/06/2023 1246   PO2ART 100 10/06/2023 1246   HCO3 23.5 10/06/2023 1246   TCO2 24 10/06/2023 1249   ACIDBASEDEF 3.0 (H) 10/06/2023 1246   O2SAT 96  10/06/2023 1246     Coagulation Profile: Recent Labs  Lab 10/02/23 1500  INR 1.0    Cardiac Enzymes: No results for input(s): "CKTOTAL", "CKMB", "CKMBINDEX", "TROPONINI" in the last 168 hours.  HbA1C: Hgb A1c MFr Bld  Date/Time Value Ref Range Status  10/02/2023 03:00 PM 6.0 (H) 4.8 - 5.6 % Final    Comment:    (NOTE) Pre diabetes:  5.7%-6.4%  Diabetes:              >6.4%  Glycemic control for   <7.0% adults with diabetes   11/01/2021 09:39 AM 6.0 4.6 - 6.5 % Final    Comment:    Glycemic Control Guidelines for People with Diabetes:Non Diabetic:  <6%Goal of Therapy: <7%Additional Action Suggested:  >8%     CBG: No results for input(s): "GLUCAP" in the last 168 hours.  Review of Systems:   Patient is encephalopathic and/or intubated; therefore, history has been obtained from chart review.    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   Surgical History:   Past Surgical History:  Procedure Laterality Date   ABDOMINAL HYSTERECTOMY  1973   Partial hysterectomy   APPENDECTOMY  1973   CHOLECYSTECTOMY  2000   COLONOSCOPY  2004   by Dr.James Weissman-had left sided diverticulosis   HAMMER TOE SURGERY  2004   HERNIA REPAIR     LEFT HEART CATH AND CORONARY ANGIOGRAPHY N/A 08/26/2023   Procedure: LEFT HEART CATH AND CORONARY ANGIOGRAPHY;  Surgeon: Tonny Bollman, MD;  Location: [facility] INVASIVE CV LAB;  Service: Cardiovascular;  Laterality: N/A;   TONSILLECTOMY  1970     Social History:   reports that she quit smoking about 17 years ago. Her smoking use included cigarettes. She has never used  smokeless tobacco. She reports that she does not drink alcohol and does not use drugs.   Family History:  Her family history includes Arthritis in her father, maternal aunt, maternal grandfather, maternal grandmother, maternal uncle, mother, paternal aunt, paternal grandfather, paternal grandmother, paternal uncle, and son; Breast cancer in her cousin, maternal aunt, and paternal grandmother; Breast cancer (age of onset: 27) in her sister; Cancer in her father, maternal grandfather, maternal uncle, mother, and paternal grandfather; Gastric cancer in her sister; Healthy in her grandchild; Other in her sister. There is no history of Colon cancer.   Allergies Allergies  Allergen Reactions   Iodinated Contrast Media Hives and Itching   Cefoxitin Sodium In Dextrose Hives   Mefoxin [Cefoxitin] Hives    "Myfoxin" during surgery per pt.      Home Medications  Prior to Admission medications   Medication Sig Start Date End Date Taking? Authorizing Provider  amLODipine (NORVASC) 5 MG tablet Take 1 tablet (5 mg total) by mouth daily. 08/21/23 11/19/23 Yes Tolia, Sunit, DO  aspirin EC 81 MG tablet Take 1 tablet (81 mg total) by mouth daily. Swallow whole. 07/03/23  Yes Tolia, Sunit, DO  Cholecalciferol (VITAMIN D3) 25 MCG (1000 UT) CAPS Take 1,000 Units by mouth at bedtime.   Yes [provider]  diphenhydramine-acetaminophen (TYLENOL PM) 25-500 MG TABS tablet Take 2 tablets by mouth at bedtime.   Yes [provider]  losartan (COZAAR) 25 MG tablet Take 25 mg by mouth in the morning. 06/17/23  Yes [provider]  Multiple Vitamins-Minerals (PRESERVISION AREDS 2 PO) Take 1 tablet by mouth 2 (two) times daily.   Yes [provider]  rosuvastatin (CRESTOR) 40 MG tablet Take 1 tablet (40 mg total) by mouth daily. 08/21/23  Yes Tolia, Sunit, DO  predniSONE & diphenhydrAMINE 3 x 50 MG & 1 x 50 MG KIT Take  first dose of Prednisone 50 mg 13 hours (8 pm) before procedure. Take  second dose of Prednisone 50 mg 7 hours (2 am) before procedure. Take third dose of Prednisone 50 mg and single dose of Benadryl 50 mg 1 hour before procedure (8 am). 08/21/23   Tessa Lerner, DO     Critical care time: 42 min        Joneen Roach, AGACNP-BC [facility]  See Amion for personal pager PCCM on call pager [phone] until 7pm. Please call Elink 7p-7a. [phone]  10/06/2023 4:31 PM

## 2023-10-06 NOTE — Procedures (Signed)
Extubation Procedure Note  Patient Details:   Name: Megan Pham DOB: 10/29/1945 MRN: [id number]   Airway Documentation:    Vent end date: 10/06/23 Vent end time: 1807   Evaluation  O2 sats: stable throughout Complications: No apparent complications Patient did tolerate procedure well. Bilateral Breath Sounds: Diminished   Yes  Pt achieved a NIF of -22 and VC of 1.01 with great pt effort on all attempts. Pt extubated to 2L [place], pt tolerated well. Cuff leak present, no stridor noted, RN at bedside, CCM notified, RT will monitor as needed.   Thornell Mule 10/06/2023, 6:22 PM

## 2023-10-06 NOTE — Discharge Summary (Signed)
[address]       [place] [address]             [phone]    Physician Discharge Summary  Patient ID: Megan Pham MRN: [id number] DOB/AGE: October 20, 1945 78 y.o.  Admit date: 10/06/2023 Discharge date: 10/18/2023  Admission Diagnoses:  Patient Active Problem List   Diagnosis Date Noted   Atrial fib/flutter, transient (HCC) 10/10/2023   Postprocedural pneumothorax 10/09/2023   AKI (acute kidney injury) (HCC) 10/08/2023   Pleural effusion 10/08/2023   Acute pulmonary edema (HCC) 10/08/2023   ABLA (acute blood loss anemia) 10/08/2023   S/P CABG x 4 10/06/2023   Coronary artery disease 10/06/2023   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   Genetic testing 06/18/2022   Essential hypertension 06/11/2022   Family history of breast cancer 05/27/2022   Family history of gastric cancer 05/27/2022   Family history of CTNNA1 mutation  05/27/2022   Dyslipidemia 11/02/2021   Pseudoptosis 11/01/2021   Vaginal irritation 03/02/2018   Osteopenia 11/28/2016   Osteoarthritis 10/11/2010     Discharge Diagnoses:  Patient Active Problem List   Diagnosis Date Noted   Atrial fib/flutter, transient (HCC) 10/10/2023   Postprocedural pneumothorax 10/09/2023   AKI (acute kidney injury) (HCC) 10/08/2023   Pleural effusion 10/08/2023   Acute pulmonary edema (HCC) 10/08/2023   ABLA (acute blood loss anemia) 10/08/2023   S/P CABG x 4 10/06/2023   Coronary artery disease 10/06/2023   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   Genetic testing 06/18/2022   Essential hypertension 06/11/2022   Family history of breast cancer 05/27/2022   Family history of gastric cancer 05/27/2022   Family history of CTNNA1 mutation  05/27/2022   Dyslipidemia 11/02/2021   Pseudoptosis 11/01/2021   Vaginal irritation 03/02/2018   Osteopenia 11/28/2016   Osteoarthritis 10/11/2010     Discharged Condition: Stable  HPI: This is a 78 year old female with a past medical history of coronary artery  disease per CCTA, hypertension, dyslipidemia, carotid artery disease, and former smoker. SShe has developed exertional dyspnea over the past several months, and states that she is unable to walk from her mailbox without getting short of breath. She does occasionally have some chest tightness, but this is only with exertion as well. She was found to have multivessel coronary artery on cardiac catheterization. Dr. Cliffton Asters discussed the need for coronary artery bypass grafting surgery. Potential risks, benefits, and complications of the surgery were discussed with the patient and she agreed to proceed with surgery. Pre operative carotid duplex US showed no significant right internal carotid artery stenosis and a 40-59% left internal carotid artery stenosis.  Hospital Course: Patient underwent a CABG x 4. She was transported from the OR to [facility] ICU in stable condition. She was extubated early the evening of surgery. She was weaned off Levophed drip. A line, chest tubes, and foley were removed early in her post op course. Epicardial pacing wires were removed on post op day one. She was stared on Lopressor on post op day 2. She was above her pre op weight and was diuresed accordingly. She was transitioned off the Insulin drip. Her pre op HGA1C was 6. She like has pre diabetes. Nutrition information will be provided with discharge paperwork. She will need further surveillance with her medical doctor after discharge. She had nausea post op and was given Zofran PRN and scheduled Reglan. She was given Sorbitol for constipation as well. She had an increased left pneumothorax  with subcutaneous emphysema after chest tube removal on 01/29. She refused a chest tube. She was on 10 L of HFNC and chest x ray done 01/30 showed enlarging large left-sided pneumothorax with extensive subcutaneous emphysema in the left chest wall and left cervical region. I discussed the importance of allowing [place] to place a left chest tube and  she was  now agreeable. Dr. Cliffton Asters placed a left chest on 01/30. Follow up chest x ray showed small, left apical pneumothorax (slightly increased), small right pleural effusion and mild bibasilar atelectasis. Of note, chest tube on water seal, not suction (suction was off at the wall). Chest tube placed to suction. She was stable for transfer from the ICU to 4E for further convalescence on 02/01 but a bed was not available until 02/02. She was weaned from HFNC to [place] over the next few days to 2 LI [facility] [place].  Left pigtail chest tube was placed to water seal on 02/03 as there was no air leak. CXR 02/04 showed possible small residual left apical pneumothorax, new density in left hilum may represent partially collapsed lung. Chest tube was placed back to suction. CXR 02/05 showed minimal left apical pneumothorax. IR was asked to evaluate for right thoracentesis on 02/05;350 cc was removed. She is now on room air and based on oxygen saturation will not need home oxygen. Chest tube was placed back to water seal on 02/06. Chest tube was removed 02/07. PA/LAT CXR on 02/08 showed no recurrent pneumothorax, minimal left basilar atelectasis and small pleural effusion.. She is very deconditioned and continues with PT/OT. She has been tolerating a diet and has had a bowel movement. Amiodarone was decreased to 200 mg daily and Toprol XL was decreased to 25 mg daily. All wounds are clean, dry, healing without signs of infection. She is stable for discharge today.  Consults: pulmonary/intensive care  Significant Diagnostic Studies:  Narrative & Impression  CLINICAL DATA:  Pneumothorax   EXAM: CHEST - 2 VIEW   COMPARISON:  10/17/2023   FINDINGS: Cardiomediastinal silhouette and pulmonary vasculature are within normal limits.   Interval removal of left basilar chest tube. No recurrent pneumothorax is seen. Mild left axillary and chest wall emphysema is unchanged. Minimal left pleural effusion and basilar atelectasis  is present. Postsurgical changes of CABG again seen.   IMPRESSION: 1. No recurrent pneumothorax status post removal of left basilar chest tube. 2. Minimal left basilar atelectasis and pleural effusion is present.     Electronically Signed   By: Acquanetta Belling M.D.   On: 10/18/2023 10:11   Narrative & Impression  CLINICAL DATA:  78 year old female with history of acute respiratory failure. Pneumothorax.   EXAM: PORTABLE CHEST 1 VIEW   COMPARISON:  Chest x-ray 10/08/2023.   FINDINGS: Lung volumes are low. Bibasilar opacities may reflect areas of atelectasis and/or consolidation, likely with superimposed moderate right and small left pleural effusions. Large left-sided pneumothorax occupying at least 50% of the volume of the left hemithorax, increased in size compared to the recent prior study. No chest tube is noted. Extensive subcutaneous emphysema noted throughout the left chest wall tracking cephalad into the lower left cervical region. No right pneumothorax. No evidence of pulmonary edema. Heart size is mildly enlarged. The patient is rotated to the left on today's exam, resulting in distortion of the mediastinal contours and reduced diagnostic sensitivity and specificity for mediastinal pathology. Atherosclerotic calcifications are noted in the thoracic aorta. Status post median sternotomy for CABG.   IMPRESSION: 1. Enlarging large  left-sided pneumothorax with extensive subcutaneous emphysema in the left chest wall and left cervical region. 2. Bibasilar areas of atelectasis and/or consolidation in the lungs with superimposed moderate right and small left pleural effusions. 3. Aortic atherosclerosis.   These results will be called to the ordering clinician or representative by the Radiologist Assistant, and communication documented in the PACS or [employer].     Electronically Signed   By: Trudie Reed M.D.   On: 10/09/2023 06:38   Narrative &  Impression  CLINICAL DATA:  [id number] Pneumothorax [id number]   EXAM: PORTABLE CHEST 1 VIEW   COMPARISON:  10/06/2023   FINDINGS: Interval removal of endotracheal and enteric tubes. Left basilar chest tube in place. Stable right IJ central venous catheter. Tubing overlying the central chest may represent a mediastinal drain or inferior approach pulmonary arterial catheter. Stable heart size dense post sternotomy and CABG. Small bilateral pleural effusions with bibasilar atelectasis, slightly increased. Small left apical pneumothorax has increased in size from prior, now approximately 10% volume.   IMPRESSION: 1. Small left apical pneumothorax has increased in size from prior, now approximately 10% volume. Left basilar chest tube in place. 2. Small bilateral pleural effusions with bibasilar atelectasis, slightly increased.     Electronically Signed   By: Duanne Guess D.O.   On: 10/07/2023 09:54      Treatments: Surgery:  CABG X 4.  LIMA LAD, RSVG PDA, OM1, OM2   Endoscopic greater saphenous vein harvest on the right and left by  Dr. Cliffton Asters on 10/06/2023.   Discharge Exam: Blood pressure (!) 119/57, pulse 63, temperature 98.1 F (36.7 C), temperature source Oral, resp. rate 20, height 5' (1.524 m), weight 76.3 kg, SpO2 96%. Cardiovascular: RRR Pulmonary: Clear to auscultation bilaterally Abdomen: Soft, non tender, bowel sounds present. Extremities:Trace bilateral lower extremity edema. Wounds: Clean and dry.  No erythema or signs of infection.  Discharge Medications:  The patient has been discharged on:   1.Beta Blocker:  Yes [ x  ]                              No   [   ]                              If No, reason:  2.Ace Inhibitor/ARB: Yes [   ]                                     No  [  x  ]                                     If No, reason:Labile BP  3.Statin:   Yes [ x  ]                  No  [   ]                  If No, reason:  4.Ecasa:  Yes  [  x ]                   No   [   ]  If No, reason:  Patient had ACS upon admission:  Plavix/P2Y12 inhibitor: Yes [   ]                                      No  [  x ]     Discharge Instructions     Amb Referral to Cardiac Rehabilitation   Complete by: As directed    Diagnosis: CABG   CABG X ___: 4   After initial evaluation and assessments completed: Virtual Based Care may be provided alone or in conjunction with Phase 2 Cardiac Rehab based on patient barriers.: Yes   Intensive Cardiac Rehabilitation (ICR) [facility] location only OR Traditional Cardiac Rehabilitation (TCR) *If criteria for ICR are not met will enroll in TCR [facility] only): Yes      Allergies as of 10/18/2023       Reactions   Iodinated Contrast Media Hives, Itching   Cefoxitin Sodium In Dextrose Hives   Mefoxin [cefoxitin] Hives   "Myfoxin" during surgery per pt.         Medication List     STOP taking these medications    amLODipine 5 MG tablet Commonly known as: NORVASC   losartan 25 MG tablet Commonly known as: COZAAR   predniSONE & diphenhydrAMINE 3 x 50 MG & 1 x 50 MG Kit       TAKE these medications    amiodarone 200 MG tablet Commonly known as: PACERONE Take 1 tablet (200 mg total) by mouth daily.   aspirin EC 325 MG tablet Take 1 tablet (325 mg total) by mouth daily. Start taking on: October 19, 2023 What changed:  medication strength how much to take additional instructions   diphenhydramine-acetaminophen 25-500 MG Tabs tablet Commonly known as: TYLENOL PM Take 2 tablets by mouth at bedtime.   furosemide 40 MG tablet Commonly known as: LASIX Take 1 tablet (40 mg total) by mouth daily. For 4 days then stop Start taking on: October 19, 2023   metoprolol succinate 25 MG 24 hr tablet Commonly known as: TOPROL-XL Take 1 tablet (25 mg total) by mouth daily. Start taking on: October 19, 2023   potassium chloride SA 20 MEQ tablet Commonly known as: KLOR-CON M Take 1 tablet  (20 mEq total) by mouth daily. For 4 days then stop. Start taking on: October 19, 2023   PRESERVISION AREDS 2 PO Take 1 tablet by mouth 2 (two) times daily.   rosuvastatin 40 MG tablet Commonly known as: CRESTOR Take 1 tablet (40 mg total) by mouth daily.   traMADol 50 MG tablet Commonly known as: ULTRAM Take 1 tablet (50 mg total) by mouth every 6 (six) hours as needed for moderate pain (pain score 4-6).   Vitamin D3 25 MCG (1000 UT) Caps Take 1,000 Units by mouth at bedtime.               Durable Medical Equipment  (From admission, onward)           Start     Ordered   10/16/23 1013  For home use only DME 4 wheeled rolling walker with seat  Once       Comments: S/p CABG  Question:  Patient needs a walker to treat with the following condition  Answer:  Physical deconditioning   10/16/23 1012   10/15/23 0657  For home use only DME Walker rolling  Once  Question Answer Comment  Walker: With 5 Inch Wheels   Patient needs a walker to treat with the following condition Physical deconditioning      10/15/23 0657   10/14/23 0707  For home use only DME 3 n 1  Once        10/14/23 0706   10/14/23 0707  For home use only DME Bedside commode  Once       Question:  Patient needs a bedside commode to treat with the following condition  Answer:  Physical deconditioning   10/14/23 1610            Follow-up Information     Beam, Chales Salmon, MD. Call.   Specialty: Family Medicine Why: for a follow up appointment regarding further surveillance of HGA1C 6 (pre diabetes) Contact information: [address] [place] [address] [phone]         Corliss Skains, MD Follow up.   Specialty: Cardiothoracic Surgery Why: Appointment is VIRTUAL;please do NOT go to the office. Dr. Cliffton Asters will call you on 02/14 at 2:30 pm Contact information: [address] [place] [address] [phone]         Tereso Newcomer T, PA-C. Go on  10/24/2023.   Specialties: Cardiology, Physician Assistant Why: Appointment time is at 8:25 am Contact information: [address] [place] [address] [phone]         [facility] Follow up.   Why: (Adapt) rolllator and bedside commode arranged- to be delivered to room prior to discharge Contact information: [address] [place] [address] [phone]         [facility] Follow up.   Why: HH arranged (RN/PT/OT) - they will contact you to schedule post discharge Contact information: [address] [place], [place] [address]  Phone: [phone]                Signed:  Ardelle Balls, PA-C 10/18/2023, 10:41 AM

## 2023-10-06 NOTE — Anesthesia Procedure Notes (Signed)
Central Venous Catheter Insertion Performed by: Collene Schlichter, MD, anesthesiologist Start/End1/27/2025 6:55 AM, 10/06/2023 7:05 AM Patient location: Pre-op. Preanesthetic checklist: patient identified, IV checked, site marked, risks and benefits discussed, surgical consent, monitors and equipment checked, pre-op evaluation, timeout performed and anesthesia consent Position: Trendelenburg Lidocaine 1% used for infiltration and patient sedated Hand hygiene performed , maximum sterile barriers used  and Seldinger technique used Catheter size: 8.5 Fr Central line was placed.Sheath introducer Procedure performed using ultrasound guided technique. Ultrasound Notes:anatomy identified, needle tip was noted to be adjacent to the nerve/plexus identified, no ultrasound evidence of intravascular and/or intraneural injection and image(s) printed for medical record Attempts: 1 Following insertion, line sutured, dressing applied and Biopatch. Post procedure assessment: free fluid flow, blood return through all ports and no air  Patient tolerated the procedure well with no immediate complications. Additional procedure comments: Triple Infusion Catheter through introducer port.

## 2023-10-06 NOTE — Interval H&P Note (Signed)
History and Physical Interval Note:  10/06/2023 7:31 AM  Megan Pham  has presented today for surgery, with the diagnosis of CAD.  The various methods of treatment have been discussed with the patient and family. After consideration of risks, benefits and other options for treatment, the patient has consented to  Procedure(s): CORONARY ARTERY BYPASS GRAFTING (CABG) (N/A) TRANSESOPHAGEAL ECHOCARDIOGRAM (TEE) (N/A) as a surgical intervention.  The patient's history has been reviewed, patient examined, no change in status, stable for surgery.  I have reviewed the patient's chart and labs.  Questions were answered to the patient's satisfaction.     Tahnee Cifuentes Keane Scrape

## 2023-10-06 NOTE — Progress Notes (Signed)
      [address]       [place] [address]             [phone]       S/p CABG x 4  Intubated, starting to wake up  BP (!) 138/90   Pulse 84   Temp (!) 96.8 F (36 C)   Resp (!) 23   Ht 5' (1.524 m)   Wt 77.2 kg   SpO2 98%   BMI 33.26 kg/m  CVP 6, CI 3.4 by Flow track Norepi @ 5   Intake/Output Summary (Last 24 hours) at 10/06/2023 1729 Last data filed at 10/06/2023 1600 Gross per 24 hour  Intake 4361.92 ml  Output 2210 ml  Net 2151.92 ml   K 4.3 Hct 28  Weaning vent now  [employer]. Dorris Fetch, MD [facility] [phone]

## 2023-10-06 NOTE — Anesthesia Procedure Notes (Signed)
Procedure Name: Intubation Date/Time: 10/06/2023 7:54 AM  Performed by: Kayleen Memos, CRNAPre-anesthesia Checklist: Patient identified, Emergency Drugs available, Suction available and Patient being monitored Patient Re-evaluated:Patient Re-evaluated prior to induction Oxygen Delivery Method: Circle System Utilized Preoxygenation: Pre-oxygenation with 100% oxygen Induction Type: IV induction Ventilation: Mask ventilation without difficulty Laryngoscope Size: Mac and 3 Grade View: Grade II Tube type: Oral Tube size: 8.0 mm Number of attempts: 1 Airway Equipment and Method: Stylet Placement Confirmation: ETT inserted through vocal cords under direct vision, positive ETCO2 and breath sounds checked- equal and bilateral Secured at: 21 cm Tube secured with: Tape Dental Injury: Teeth and Oropharynx as per pre-operative assessment

## 2023-10-06 NOTE — Hospital Course (Addendum)
HPI: This is a 78 year old female with a past medical history of coronary artery disease per CCTA, hypertension, dyslipidemia, carotid artery disease, and former smoker. SShe has developed exertional dyspnea over the past several months, and states that she is unable to walk from her mailbox without getting short of breath. She does occasionally have some chest tightness, but this is only with exertion as well. She was found to have multivessel coronary artery on cardiac catheterization. Dr. Cliffton Asters discussed the need for coronary artery bypass grafting surgery. Potential risks, benefits, and complications of the surgery were discussed with the patient and she agreed to proceed with surgery. Pre operative carotid duplex US showed no significant right internal carotid artery stenosis and a 40-59% left internal carotid artery stenosis.  Hospital Course: Patient underwent a CABG x 4. She was transported from the OR to [facility] ICU in stable condition. She was extubated early the evening of surgery. She was weaned off Levophed drip. A line, chest tubes, and foley were removed early in her post op course. Epicardial pacing wires were removed on post op day one. She was stared on Lopressor on post op day 2. She was above her pre op weight and was diuresed accordingly. She was transitioned off the Insulin drip. Her pre op HGA1C was 6. She like has pre diabetes. Nutrition information will be provided with discharge paperwork. She will need further surveillance with her medical doctor after discharge. She had nausea post op and was given Zofran PRN and scheduled Reglan. She was given Sorbitol for constipation as well. She had an increased left pneumothorax with subcutaneous emphysema after chest tube removal on 01/29. She refused a chest tube. She was on 10 L of HFNC and chest x ray done 01/30 showed enlarging large left-sided pneumothorax with extensive subcutaneous emphysema in the left chest wall and left cervical region.  I discussed the importance of allowing [place] to place a left chest tube and  she was now agreeable. Dr. Cliffton Asters placed a left chest on 01/30. Follow up chest x ray showed small, left apical pneumothorax (slightly increased), small right pleural effusion and mild bibasilar atelectasis. Of note, chest tube on water seal, not suction (suction was off at the wall). Chest tube placed to suction. She was stable for transfer from the ICU to 4E for further convalescence on 02/01. She was weaned from HFNC to [place] over the next few days. She has been tolerating a diet and has had a bowel movement. All wounds are clean, dry, healing without signs of infection.

## 2023-10-06 NOTE — Op Note (Signed)
[address]       [place] [address]             [phone]                                          10/06/2023 Patient:  Megan Pham Pre-Op Dx: 3V CAD HTN HLP Obesity DM   Post-op Dx:  same Procedure: CABG X 4.  LIMA LAD, RSVG PDA, OM1, OM2   Endoscopic greater saphenous vein harvest on the right and left   Surgeon and Role:      * Jaylon Grode, Eliezer Lofts, MD - Primary    * Jacques Earthly , PA-C - assisting An experienced assistant was required given the complexity of this surgery and the standard of surgical care. The assistant was needed for exposure, dissection, suctioning, retraction of delicate tissues and sutures, instrument exchange and for overall help during this procedure.    Anesthesia  general EBL:  Blood Administration: 1 unit pRBCs Xclamp Time:  62 min Pump Time:   Drains: 62 F blake drain:  L, mediastinal  Wires: ventricular Counts: correct   Indications: 78 year old female with three-vessel coronary artery disease. Left heart catheterization shows good distal targets. Echocardiogram shows preserved biventricular function and no significant valvular disease. We discussed the risks and benefits of coronary bypass grafting, and she is agreeable to proceed.  Findings: Good LIMA.  Good vein.  Good LAD, small OM1, good OM2, and PDA  Operative Technique: All invasive lines were placed in pre-op holding.  After the risks, benefits and alternatives were thoroughly discussed, the patient was brought to the operative theatre.  Anesthesia was induced, and the patient was prepped and draped in normal sterile fashion.  An appropriate surgical pause was performed, and pre-operative antibiotics were dosed accordingly.  We began with simultaneous incisions along the right leg for harvesting of the greater saphenous vein and the chest for the sternotomy.  In regards to the sternotomy, this was carried down with bovie cautery, and the  sternum was divided with a reciprocating saw.  Meticulous hemostasis was obtained.  The left internal thoracic artery was exposed and harvested in in pedicled fashion.  The patient was systemically heparinized, and the artery was divided distally, and placed in a papaverine sponge.    The sternal elevator was removed, and a retractor was placed.  The pericardium was divided in the midline and fashioned into a cradle with pericardial stitches.   After we confirmed an appropriate ACT, the ascending aorta was cannulated in standard fashion.  The right atrial appendage was used for venous cannulation site.  Cardiopulmonary bypass was initiated, and the heart retractor was placed. The cross clamp was applied, and a dose of anterograde cardioplegia was given with good arrest of the heart.  We moved to the posterior wall of the heart, and found a good target on the PDA.  An arteriotomy was made, and the vein graft was anastomosed to it in an end to side fashion.  Next we exposed the lateral wall, and found a good target on the OM2.  An end to side anastomosis with the vein graft was then created.  Next, we exposed another target the lateral wall of the heart.  An arteriotomy was created.  The vein was anastomosed in an end to side fashion.  Finally, we exposed  a good target on the LAD, and fashioned an end to side anastomosis between it and the LITA.  We began to re-warm, and a re-animation dose of cardioplegia was given.  The heart was de-aired, and the cross clamp was removed.  Meticulous hemostasis was obtained.    A partial occludding clamp was then placed on the ascending aorta, and we created an end to side anastomosis between it and the proximal vein grafts.  Rings were placed on the proximal anastomosis.  Hemostasis was obtained, and we separated from cardiopulmonary bypass without event.  The heparin was reversed with protamine.  Chest tubes and wires were placed, and the sternum was re-approximated with  sternal wires.  The soft tissue and skin were re-approximated wth absorbable suture.    The patient tolerated the procedure without any immediate complications, and was transferred to the ICU in guarded condition.  Megan Pham

## 2023-10-06 NOTE — Anesthesia Procedure Notes (Signed)
Arterial Line Insertion Start/End1/27/2025 6:45 AM, 10/06/2023 6:50 AM Performed by: Collene Schlichter, MD, Kayleen Memos, CRNA, CRNA  Patient location: Pre-op. Preanesthetic checklist: patient identified, IV checked, site marked, risks and benefits discussed, surgical consent, monitors and equipment checked, pre-op evaluation, timeout performed and anesthesia consent Lidocaine 1% used for infiltration and patient sedated Left, radial was placed Catheter size: 20 G Hand hygiene performed  and maximum sterile barriers used   Attempts: 2 Procedure performed without using ultrasound guided technique. Following insertion, Biopatch and dressing applied. Post procedure assessment: normal  Patient tolerated the procedure well with no immediate complications.

## 2023-10-06 NOTE — Transfer of Care (Signed)
Immediate Anesthesia Transfer of Care Note  Patient: Megan Pham  Procedure(s) Performed: CORONARY ARTERY BYPASS GRAFTING (CABG) TIMES FOUR USING LEFT INTERNAL MAMMARY ARTERY AND BILATERALLY ENDOSCOPICALLY HARVESTED GREATER SAPHENOUS VEINS (Chest) TRANSESOPHAGEAL ECHOCARDIOGRAM (TEE)  Patient Location: ICU  Anesthesia Type:General  Level of Consciousness: Patient remains intubated per anesthesia plan  Airway & Oxygen Therapy: Patient remains intubated per anesthesia plan and Patient placed on Ventilator (see vital sign flow sheet for setting)  Post-op Assessment: Report given to RN and Post -op Vital signs reviewed and stable  Post vital signs: Reviewed and stable  Last Vitals:  Vitals Value Taken Time  BP 98/44 10/06/23 1350  Temp    Pulse 71 10/06/23 1348  Resp 16 10/06/23 1348  SpO2 100 % 10/06/23 1348  Vitals shown include unfiled device data.  Last Pain:  Vitals:   10/06/23 0613  TempSrc: Oral  PainSc:          Complications: No notable events documented.

## 2023-10-06 NOTE — Brief Op Note (Signed)
10/06/2023  12:03 PM  PATIENT:  Megan Pham  78 y.o. female  PRE-OPERATIVE DIAGNOSIS:  Coronary Artery Disease  POST-OPERATIVE DIAGNOSIS:  Coronary Artery Disease  PROCEDURE:TRANSESOPHAGEAL ECHOCARDIOGRAM (TEE), CORONARY ARTERY BYPASS GRAFTING (CABG) TIMES FOUR (LIMA to LAD, SVG to OM1, SVG to OM2, SVG to PDA) USING LEFT INTERNAL MAMMARY ARTERY AND BILATERALLY ENDOSCOPICALLY HARVESTED GREATER SAPHENOUS VEINS    Vein harvest time: 52 min Vein prep time: 24 min  SURGEON:  Surgeons and Role:    Corliss Skains, MD - Primary  PHYSICIAN ASSISTANT: Doree Fudge PA-C  ASSISTANTS: Virgilio Frees RNFA   ANESTHESIA:   general  EBL: Per anesthesia, perfusion record  BLOOD ADMINISTERED: Per perfusion record  DRAINS:  Chest tubes placed in the mediastinal and pleural spaces    COUNTS CORRECT:  YES  DICTATION: .Dragon Dictation  PLAN OF CARE: Admit to inpatient   PATIENT DISPOSITION:  ICU - intubated and hemodynamically stable.   Delay start of Pharmacological VTE agent (>24hrs) due to surgical blood loss or risk of bleeding: no  BASELINE WEIGHT: 77.2  kg

## 2023-10-07 ENCOUNTER — Encounter (HOSPITAL_COMMUNITY): Payer: Self-pay | Admitting: Thoracic Surgery (Cardiothoracic Vascular Surgery)

## 2023-10-07 ENCOUNTER — Inpatient Hospital Stay (HOSPITAL_COMMUNITY): Payer: 59

## 2023-10-07 DIAGNOSIS — Z951 Presence of aortocoronary bypass graft: Secondary | ICD-10-CM | POA: Diagnosis not present

## 2023-10-07 DIAGNOSIS — I959 Hypotension, unspecified: Secondary | ICD-10-CM

## 2023-10-07 DIAGNOSIS — D62 Acute posthemorrhagic anemia: Secondary | ICD-10-CM

## 2023-10-07 DIAGNOSIS — R739 Hyperglycemia, unspecified: Secondary | ICD-10-CM | POA: Diagnosis not present

## 2023-10-07 LAB — CBC
HCT: 28.3 % — ABNORMAL LOW (ref 36.0–46.0)
HCT: 29.4 % — ABNORMAL LOW (ref 36.0–46.0)
Hemoglobin: 9.3 g/dL — ABNORMAL LOW (ref 12.0–15.0)
Hemoglobin: 9.6 g/dL — ABNORMAL LOW (ref 12.0–15.0)
MCH: 29.5 pg (ref 26.0–34.0)
MCH: 30 pg (ref 26.0–34.0)
MCHC: 32.9 g/dL (ref 30.0–36.0)
MCHC: 32.7 g/dL (ref 30.0–36.0)
MCV: 89.8 fL (ref 80.0–100.0)
MCV: 91.9 fL (ref 80.0–100.0)
Platelets: 142 10*3/uL — ABNORMAL LOW (ref 150–400)
Platelets: 139 10*3/uL — ABNORMAL LOW (ref 150–400)
RBC: 3.15 MIL/uL — ABNORMAL LOW (ref 3.87–5.11)
RBC: 3.2 MIL/uL — ABNORMAL LOW (ref 3.87–5.11)
RDW: 13.2 % (ref 11.5–15.5)
RDW: 13.5 % (ref 11.5–15.5)
WBC: 10.5 10*3/uL (ref 4.0–10.5)
WBC: 12.3 10*3/uL — ABNORMAL HIGH (ref 4.0–10.5)
nRBC: 0 % (ref 0.0–0.2)
nRBC: 0 % (ref 0.0–0.2)

## 2023-10-07 LAB — BASIC METABOLIC PANEL
Anion gap: 8 (ref 5–15)
Anion gap: 10 (ref 5–15)
BUN: 7 mg/dL — ABNORMAL LOW (ref 8–23)
BUN: 12 mg/dL (ref 8–23)
CO2: 23 mmol/L (ref 22–32)
CO2: 24 mmol/L (ref 22–32)
Calcium: 7.9 mg/dL — ABNORMAL LOW (ref 8.9–10.3)
Calcium: 8.4 mg/dL — ABNORMAL LOW (ref 8.9–10.3)
Chloride: 108 mmol/L (ref 98–111)
Chloride: 105 mmol/L (ref 98–111)
Creatinine, Ser: 0.55 mg/dL (ref 0.44–1.00)
Creatinine, Ser: 0.89 mg/dL (ref 0.44–1.00)
GFR, Estimated: >60 mL/min (ref >60)
GFR, Estimated: >60 mL/min (ref >60)
Glucose, Bld: 120 mg/dL — ABNORMAL HIGH (ref 70–99)
Glucose, Bld: 143 mg/dL — ABNORMAL HIGH (ref 70–99)
Potassium: 3.8 mmol/L (ref 3.5–5.1)
Potassium: 4.7 mmol/L (ref 3.5–5.1)
Sodium: 139 mmol/L (ref 135–145)
Sodium: 139 mmol/L (ref 135–145)

## 2023-10-07 LAB — MAGNESIUM
Magnesium: 2.8 mg/dL — ABNORMAL HIGH (ref 1.7–2.4)
Magnesium: 2.6 mg/dL — ABNORMAL HIGH (ref 1.7–2.4)

## 2023-10-07 LAB — GLUCOSE, CAPILLARY
Glucose-Capillary: 101 mg/dL — ABNORMAL HIGH (ref 70–99)
Glucose-Capillary: 113 mg/dL — ABNORMAL HIGH (ref 70–99)
Glucose-Capillary: 114 mg/dL — ABNORMAL HIGH (ref 70–99)
Glucose-Capillary: 122 mg/dL — ABNORMAL HIGH (ref 70–99)
Glucose-Capillary: 125 mg/dL — ABNORMAL HIGH (ref 70–99)
Glucose-Capillary: 126 mg/dL — ABNORMAL HIGH (ref 70–99)
Glucose-Capillary: 132 mg/dL — ABNORMAL HIGH (ref 70–99)
Glucose-Capillary: 133 mg/dL — ABNORMAL HIGH (ref 70–99)
Glucose-Capillary: 136 mg/dL — ABNORMAL HIGH (ref 70–99)
Glucose-Capillary: 137 mg/dL — ABNORMAL HIGH (ref 70–99)
Glucose-Capillary: 143 mg/dL — ABNORMAL HIGH (ref 70–99)

## 2023-10-07 LAB — ECHO INTRAOPERATIVE TEE
Height: 60 in
Weight: 2724.82 [oz_av]

## 2023-10-07 MED ORDER — INSULIN ASPART 100 UNIT/ML IJ SOLN
0.0000 [IU] | INTRAMUSCULAR | Status: DC
  Administered 2023-10-07 – 2023-10-08 (×4): 2 [IU] via SUBCUTANEOUS
  Administered 2023-10-08: 3 [IU] via SUBCUTANEOUS
  Administered 2023-10-08 – 2023-10-10 (×4): 2 [IU] via SUBCUTANEOUS
  Administered 2023-10-11: 3 [IU] via SUBCUTANEOUS
  Administered 2023-10-11 – 2023-10-12 (×4): 2 [IU] via SUBCUTANEOUS

## 2023-10-07 NOTE — Progress Notes (Signed)
      [address]       [place] [address]             [phone]                 1 Day Post-Op Procedure(s) (LRB): CORONARY ARTERY BYPASS GRAFTING (CABG) TIMES FOUR USING LEFT INTERNAL MAMMARY ARTERY AND BILATERALLY ENDOSCOPICALLY HARVESTED GREATER SAPHENOUS VEINS (N/A) TRANSESOPHAGEAL ECHOCARDIOGRAM (TEE) (N/A)   Events: No events extubated _______________________________________________________________ Vitals: BP (!) 145/51 (BP Location: Right Arm)   Pulse 72   Temp 98.6 F (37 C)   Resp 19   Ht 5' (1.524 m)   Wt 81 kg   SpO2 90%   BMI 34.88 kg/m  Filed Weights   10/06/23 0613 10/07/23 0547  Weight: 77.2 kg 81 kg     - Neuro: alert NAd  - Cardiovascular: sinus  Drips: levo 8.   CVP:  [3 mmHg-21 mmHg] 11 mmHg CO:  [4 L/min-6.4 L/min] 5.9 L/min CI:  [2.3 L/min/m2-3.7 L/min/m2] 3.4 L/min/m2  - Pulm: EWOB  ABG    Component Value Date/Time   PHART 7.359 10/06/2023 1859   PCO2ART 42.4 10/06/2023 1859   PO2ART 99 10/06/2023 1859   HCO3 23.9 10/06/2023 1859   TCO2 25 10/06/2023 1859   ACIDBASEDEF 2.0 10/06/2023 1859   O2SAT 97 10/06/2023 1859    - Abd: ND - Extremity: warm  .Intake/Output      01/27 0701 01/28 0700 01/28 0701 01/29 0700   P.O. 240    I.V. (mL/kg) 2443.8 (30.2) 449.7 (5.6)   Blood 480    IV Piggyback 2207.3    Total Intake(mL/kg) 5371.1 (66.3) 449.7 (5.6)   Urine (mL/kg/hr) 2065 (1.1) 20 (0.2)   Blood 850    Chest Tube 380    Total Output 3295 20   Net +2076.1 +429.7           _______________________________________________________________ Labs:    Latest Ref Rng & Units 10/07/2023    3:09 AM 10/06/2023    8:00 PM 10/06/2023    6:59 PM  CBC  WBC 4.0 - 10.5 K/uL 10.5  9.8    Hemoglobin 12.0 - 15.0 g/dL 9.3  9.6  9.9   Hematocrit 36.0 - 46.0 % 28.3  29.0  29.0   Platelets 150 - 400 K/uL 142  130        Latest Ref Rng & Units 10/07/2023    3:09 AM 10/06/2023    8:00 PM 10/06/2023    6:59 PM  CMP   Glucose 70 - 99 mg/dL 784  696    BUN 8 - 23 mg/dL 7  6    Creatinine 2.95 - 1.00 mg/dL 2.84  1.32    Sodium 440 - 145 mmol/L 139  141  144   Potassium 3.5 - 5.1 mmol/L 3.8  4.0  3.9   Chloride 98 - 111 mmol/L 108  109    CO2 22 - 32 mmol/L 23  23    Calcium 8.9 - 10.3 mg/dL 7.9  7.9      CXR: PV congestion  _______________________________________________________________  Assessment and Plan: POD 1 s/p CABG  Neuro: pain controlled CV: wean levo for SBP >100.  Will remove wires.  On A/S Pulm: IS, ambulation Renal: creat stable GI: on diet Heme: stsable ID: [email or web address] Endo: SSI Dispo: continue ICU   Corliss Skains 10/07/2023 8:38 AM

## 2023-10-07 NOTE — Anesthesia Postprocedure Evaluation (Signed)
Anesthesia Post Note  Patient: Megan Pham  Procedure(s) Performed: CORONARY ARTERY BYPASS GRAFTING (CABG) TIMES FOUR USING LEFT INTERNAL MAMMARY ARTERY AND BILATERALLY ENDOSCOPICALLY HARVESTED GREATER SAPHENOUS VEINS (Chest) TRANSESOPHAGEAL ECHOCARDIOGRAM (TEE)     Patient location during evaluation: SICU Anesthesia Type: General Level of consciousness: sedated Pain management: pain level controlled Vital Signs Assessment: post-procedure vital signs reviewed and stable Respiratory status: patient remains intubated per anesthesia plan Cardiovascular status: stable Postop Assessment: no apparent nausea or vomiting Anesthetic complications: no Comments: Requiring levophed infusion for BP support.   No notable events documented.  Last Vitals:  Vitals:   10/07/23 1400 10/07/23 1500  BP: (!) 102/49 (!) 128/52  Pulse: 64 78  Resp: 11 20  Temp:    SpO2: 94% 94%    Last Pain:  Vitals:   10/07/23 1540  TempSrc:   PainSc: Asleep                 Collene Schlichter

## 2023-10-07 NOTE — Consult Note (Addendum)
 NAME:  SHERISE GEERDES, MRN:  [id number], DOB:  Jan 11, 1946, LOS: 1 ADMISSION DATE:  10/06/2023, CONSULTATION DATE:  1/27 REFERRING MD:  Cliffton Asters, CHIEF COMPLAINT:  post-CABG   History of Present Illness:  78 year old female with PMH as below, which is significant for carotid stenosis, HTN, HLD, and CAD. Physical activity has become limited by dyspnea over a period of a few months. She became unable to walk to and from her mailbox without significant dyspnea. Also complained of exertional chest tightness intermittently. She underwent coronary CT screening for CAD with her cardiologist with concerning findings. She was referred for LHC demonstrated triple vessel disease including CTO RCA; no LM disease. Echo demonstrated preserved ejection fraction. She was referred to thoracic surgery and was felt to have good distal targets for bypass grafting. She presented to [facility]G 1/27. Surgery was tolerated well with no immediate complications and the patient was transferred to the CVICU postoperatively for ongoing care. PCCM consulted for medical and ventilator management.   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   Significant Hospital Events: Including procedures, antibiotic start and stop dates in addition to other pertinent events   1/27 CABG x 4. LIMA LAD, RSVG PDA, OM1, OM2   Interim History / Subjective:  Overnight extubated per rapid wean. She has some pain, no nausea.  Objective   Blood pressure (!) 107/40, pulse 68, temperature 98.6 F (37 C), resp. rate (!) 8, height 5' (1.524 m), weight 81 kg, SpO2 92%. CVP:   [3 mmHg-21 mmHg] 12 mmHg CO:  [4 L/min-6.4 L/min] 4.5 L/min CI:  [2.3 L/min/m2-3.7 L/min/m2] 2.6 L/min/m2  Vent Mode: PSV;CPAP FiO2 (%):  [40 %-50 %] 40 % Set Rate:  [16 bmp-24 bmp] 24 bmp Vt Set:  [360 mL] 360 mL PEEP:  [5 cmH20] 5 cmH20 Pressure Support:  [10 cmH20] 10 cmH20 Plateau Pressure:  [13 cmH20] 13 cmH20   Intake/Output Summary (Last 24 hours) at 10/07/2023 0716 Last data filed at 10/07/2023 0701 Gross per 24 hour  Intake 5769.89 ml  Output 3295 ml  Net 2474.89 ml   Filed Weights   10/06/23 0613 10/07/23 0547  Weight: 77.2 kg 81 kg    Examination: General: ill appearing woman lying in bed in NAD HENT: [place]/AT, eyes anicteric Lungs: breathing comfortably on [place], faint wheezing. Serosanguinous chest tube output Cardiovascular: S1S2, RRR Abdomen: soft, NT Extremities: mild edema, no cyanosis Neuro: awake, alert, no focal deficits GU: foley with clear UOP  Chest tube output 380cc  BUN 7 Cr 0.55 WBC 10.5 H/H 9.3/28.3 Platelets 142 CXR personally reviewed> chest tubes, atelectasis bilateral bases EKG: NSR, ST elevations II, AVF  Resolved Hospital Problem list     Assessment & Plan:   3-vessel CAD; LVEF 60-65% S/p CABG x 4 Post-op vasoplegia -post-op care per TCTS -wean NE to maintain SBP >100; no MAP goal with wide pulse pressure -complete post-op antibiotics -post-op pain control per protocol -aspirin, statin -hold metoprolol until off NE -d/c foley  Post-op vent management Hypercarbia: former smoker, possible undiagnosed COPD -pulmonary hygiene -wean supplemental O2 to maintain SpO2 >90% -con't nebs  Vasoplegia status-post cardiac surgery -wean NE for SBP>100  Hyperglycemia; h/o DM. A1c 6 -transition to SSI PRN -  goal BG <180  ABLA- expected post-op Consumptive thrombocytopenia- expected post-op Consumptive coagulopathy- expected post-op -monitor -transfuse for Hb <7 or hemodynamically significant bleeding  Hypertension -hold PTA  losartan & amlodipine    Best Practice (right click and "Reselect all SmartList Selections" daily)   Diet/type: clear liquids; progress as tolerated DVT prophylaxis LMWH Pressure ulcer(s): N/A GI prophylaxis: PPI Lines: Central line, Arterial Line, and yes and it is still needed Foley:  removal ordered  Code Status:  full code Last date of multidisciplinary goals of care discussion [ ]   Labs   CBC: Recent Labs  Lab 10/02/23 1500 10/06/23 0800 10/06/23 1117 10/06/23 1126 10/06/23 1350 10/06/23 1352 10/06/23 1610 10/06/23 1803 10/06/23 1859 10/06/23 2000 10/07/23 0309  WBC 7.6  --   --   --  14.8*  --   --   --   --  9.8 10.5  HGB 13.1   < > 7.2*   < > 10.4*   < > 9.5* 9.9* 9.9* 9.6* 9.3*  HCT 41.1   < > 21.5*   < > 31.2*   < > 28.0* 29.0* 29.0* 29.0* 28.3*  MCV 91.7  --   --   --  91.0  --   --   --   --  89.8 89.8  PLT 324  --  172  --  136*  --   --   --   --  130* 142*   < > = values in this interval not displayed.    Basic Metabolic Panel: Recent Labs  Lab 10/02/23 1500 10/06/23 0800 10/06/23 1126 10/06/23 1214 10/06/23 1246 10/06/23 1249 10/06/23 1352 10/06/23 1610 10/06/23 1803 10/06/23 1859 10/06/23 2000 10/07/23 0309  NA 137   < > 139 140   < > 142   < > 145 144 144 141 139  K 4.7   < > 4.9 4.4   < > 4.1   < > 4.3 4.0 3.9 4.0 3.8  CL 103   < > 104 105  --  108  --   --   --   --  109 108  CO2 26  --   --   --   --   --   --   --   --   --  23 23  GLUCOSE 118*   < > 164* 154*  --  144*  --   --   --   --  159* 120*  BUN 13   < > 8 7*  --  8  --   --   --   --  6* 7*  CREATININE 0.68   < > 0.50 0.40*  --  0.50  --   --   --   --  0.74 0.55  CALCIUM 9.4  --   --   --   --   --   --   --   --   --  7.9* 7.9*  MG  --   --   --   --   --   --   --   --   --   --  3.3* 2.8*   < > = values in this interval not displayed.   GFR: Estimated Creatinine Clearance: 55.5 mL/min (by C-G formula based on SCr of 0.55 mg/dL). Recent Labs  Lab 10/02/23 1500  10/06/23 1350 10/06/23 2000 10/07/23 0309  WBC 7.6 14.8* 9.8 10.5    Liver Function Tests: Recent Labs  Lab 10/02/23 1500  AST 25  ALT 24  ALKPHOS 102  BILITOT 0.5  PROT 7.6  ALBUMIN 4.1    Critical care time:         This patient is critically ill with multiple organ system failure which requires frequent high complexity decision making, assessment, support, evaluation, and titration of therapies. This was completed through the application of advanced monitoring technologies and extensive interpretation of multiple databases. During this encounter critical care time was devoted to patient care services described in this note for 34 minutes.  Steffanie Dunn, DO 10/07/23 12:42 PM [facility]  For contact information, see Amion. If no response to pager, please call PCCM consult pager. After hours, 7PM- 7AM, please call Elink.

## 2023-10-07 NOTE — Discharge Instructions (Addendum)
Discharge Instructions:  1. You may shower, please wash incisions daily with soap and water and keep dry.  If you wish to cover wounds with dressing you may do so but please keep clean and change daily.  No tub baths or swimming until incisions have completely healed.  If your incisions become red or develop any drainage please call our office at [phone]  2. No Driving until cleared by Dr. Lucilla Lame office and you are no longer using narcotic pain medications  3. Monitor your weight daily.. Please use the same scale and weigh at same time... If you gain 5-10 lbs in 48 hours with associated lower extremity swelling, please contact our office at [phone]  4. Fever of 101.5 for at least 24 hours with no source, please contact our office at [phone]  5. Activity- up as tolerated, please walk at least 3 times per day.  Avoid strenuous activity, no lifting, pushing, or pulling with your arms over 8-10 lbs for a minimum of 6 weeks  6. If any questions or concerns arise, please do not hesitate to contact our office at [phone]

## 2023-10-07 NOTE — Plan of Care (Signed)
  Problem: Education: Goal: Knowledge of General Education information will improve Description: Including pain rating scale, medication(s)/side effects and non-pharmacologic comfort measures Outcome: Progressing   Problem: Health Behavior/Discharge Planning: Goal: Ability to manage health-related needs will improve Outcome: Progressing   Problem: Pain Managment: Goal: General experience of comfort will improve and/or be controlled Outcome: Progressing

## 2023-10-08 ENCOUNTER — Inpatient Hospital Stay (HOSPITAL_COMMUNITY): Payer: 59

## 2023-10-08 DIAGNOSIS — J810 Acute pulmonary edema: Secondary | ICD-10-CM

## 2023-10-08 DIAGNOSIS — E1165 Type 2 diabetes mellitus with hyperglycemia: Secondary | ICD-10-CM | POA: Diagnosis not present

## 2023-10-08 DIAGNOSIS — J90 Pleural effusion, not elsewhere classified: Secondary | ICD-10-CM | POA: Diagnosis not present

## 2023-10-08 DIAGNOSIS — D62 Acute posthemorrhagic anemia: Secondary | ICD-10-CM

## 2023-10-08 DIAGNOSIS — N179 Acute kidney failure, unspecified: Secondary | ICD-10-CM

## 2023-10-08 LAB — BASIC METABOLIC PANEL
Anion gap: 6 (ref 5–15)
BUN: 21 mg/dL (ref 8–23)
CO2: 25 mmol/L (ref 22–32)
Calcium: 8.4 mg/dL — ABNORMAL LOW (ref 8.9–10.3)
Chloride: 106 mmol/L (ref 98–111)
Creatinine, Ser: 1.18 mg/dL — ABNORMAL HIGH (ref 0.44–1.00)
GFR, Estimated: 48 mL/min — ABNORMAL LOW (ref >60)
Glucose, Bld: 126 mg/dL — ABNORMAL HIGH (ref 70–99)
Potassium: 5.1 mmol/L (ref 3.5–5.1)
Sodium: 137 mmol/L (ref 135–145)

## 2023-10-08 LAB — CBC
HCT: 29.4 % — ABNORMAL LOW (ref 36.0–46.0)
Hemoglobin: 9.3 g/dL — ABNORMAL LOW (ref 12.0–15.0)
MCH: 29.5 pg (ref 26.0–34.0)
MCHC: 31.6 g/dL (ref 30.0–36.0)
MCV: 93.3 fL (ref 80.0–100.0)
Platelets: 137 10*3/uL — ABNORMAL LOW (ref 150–400)
RBC: 3.15 MIL/uL — ABNORMAL LOW (ref 3.87–5.11)
RDW: 13.4 % (ref 11.5–15.5)
WBC: 12.4 10*3/uL — ABNORMAL HIGH (ref 4.0–10.5)
nRBC: 0 % (ref 0.0–0.2)

## 2023-10-08 LAB — GLUCOSE, CAPILLARY
Glucose-Capillary: 105 mg/dL — ABNORMAL HIGH (ref 70–99)
Glucose-Capillary: 99 mg/dL (ref 70–99)
Glucose-Capillary: 160 mg/dL — ABNORMAL HIGH (ref 70–99)
Glucose-Capillary: 120 mg/dL — ABNORMAL HIGH (ref 70–99)
Glucose-Capillary: 133 mg/dL — ABNORMAL HIGH (ref 70–99)
Glucose-Capillary: 141 mg/dL — ABNORMAL HIGH (ref 70–99)
Glucose-Capillary: 108 mg/dL — ABNORMAL HIGH (ref 70–99)

## 2023-10-08 MED ORDER — SODIUM CHLORIDE 0.9% FLUSH
3.0000 mL | Freq: Two times a day (BID) | INTRAVENOUS | Status: DC
  Administered 2023-10-08 – 2023-10-09 (×3): 3 mL via INTRAVENOUS

## 2023-10-08 MED ORDER — EZETIMIBE 10 MG PO TABS
10.0000 mg | ORAL_TABLET | Freq: Every day | ORAL | Status: DC
  Administered 2023-10-08 – 2023-10-09 (×2): 10 mg via ORAL
  Filled 2023-10-08 (×2): qty 1

## 2023-10-08 MED ORDER — SODIUM CHLORIDE 0.9 % IV SOLN
250.0000 mL | INTRAVENOUS | Status: DC | PRN
Start: 2023-10-08 — End: 2023-10-09

## 2023-10-08 MED ORDER — FUROSEMIDE 10 MG/ML IJ SOLN: 40.0000 mg | Freq: Once | INTRAMUSCULAR | Status: DC

## 2023-10-08 MED ORDER — METOCLOPRAMIDE HCL 5 MG/ML IJ SOLN
10.0000 mg | Freq: Four times a day (QID) | INTRAMUSCULAR | Status: AC
  Administered 2023-10-08 – 2023-10-09 (×7): 10 mg via INTRAVENOUS
  Filled 2023-10-08 (×7): qty 2

## 2023-10-08 MED ORDER — ~~LOC~~ CARDIAC SURGERY, PATIENT & FAMILY EDUCATION: Freq: Once | Status: AC

## 2023-10-08 MED ORDER — SODIUM CHLORIDE 0.9% FLUSH
3.0000 mL | INTRAVENOUS | Status: DC | PRN
Start: 2023-10-08 — End: 2023-10-09

## 2023-10-08 MED ORDER — FUROSEMIDE 40 MG PO TABS
40.0000 mg | ORAL_TABLET | Freq: Every day | ORAL | Status: DC
Start: 2023-10-08 — End: 2023-10-09
  Administered 2023-10-08: 40 mg via ORAL
  Filled 2023-10-08: qty 1

## 2023-10-08 MED FILL — Electrolyte-R (PH 7.4) Solution: INTRAVENOUS | Qty: 5000 | Status: AC

## 2023-10-08 MED FILL — Calcium Chloride Inj 10%: INTRAVENOUS | Qty: 10 | Status: AC

## 2023-10-08 MED FILL — Sodium Chloride IV Soln 0.9%: INTRAVENOUS | Qty: 1000 | Status: AC

## 2023-10-08 MED FILL — Sodium Bicarbonate IV Soln 8.4%: INTRAVENOUS | Qty: 50 | Status: AC

## 2023-10-08 MED FILL — Heparin Sodium (Porcine) Inj 1000 Unit/ML: INTRAMUSCULAR | Qty: 20 | Status: AC

## 2023-10-08 MED FILL — Mannitol IV Soln 20%: INTRAVENOUS | Qty: 250 | Status: AC

## 2023-10-08 MED FILL — Albumin, Human Inj 5%: INTRAVENOUS | Qty: 250 | Status: AC

## 2023-10-08 NOTE — Progress Notes (Addendum)
Addendum-  PCCM MD and primary CVTS MD have discussed, plan will be to watch this for now. To remain in ICU. Will cancel IR order.   ____________________________   Spoken w on call TCTS who recommends replacement of chest tube (small bore) in this POD 2 CABG x4 who had chest tube removal earlier today for interval enlargement of L ptx.   I placed an IR consult for this & have a page out to the on call provider. I also have been to bedside to evaluate the pt.  She is breathing comfortably in NAD. On HFNC, actually weaned a bit from this morning. I did not see apical lung slide on ultrasound.   No chest pain.  Trachea is midline.    I talked to her about replacing the chest tube with a smaller tube than her surgical tube. She wants to think about this. We talked about the possibility of this worsening and becoming an urgent/emergent need. She still would like to think about it.   I am cancelling her transfer to progressive. Will keep her in ICU as I anticipate she will need a chest tube placed tonight (either by IR or by PCCM).      Tessie Fass MSN, AGACNP-BC [facility] 10/08/2023, 6:20 PM

## 2023-10-08 NOTE — Progress Notes (Signed)
Notified by radiology of left pneumothorax with [place] air. Working on contacting TCTS to discuss replacing chest tube.  Steffanie Dunn, DO 10/08/23 5:20 PM [facility]  For contact information, see Amion. If no response to pager, please call PCCM consult pager. After hours, 7PM- 7AM, please call Elink.

## 2023-10-08 NOTE — Plan of Care (Signed)

## 2023-10-08 NOTE — Progress Notes (Addendum)
      [address]       [place] [address]             [phone]        2 Days Post-Op Procedure(s) (LRB): CORONARY ARTERY BYPASS GRAFTING (CABG) TIMES FOUR USING LEFT INTERNAL MAMMARY ARTERY AND BILATERALLY ENDOSCOPICALLY HARVESTED GREATER SAPHENOUS VEINS (N/A) TRANSESOPHAGEAL ECHOCARDIOGRAM (TEE) (N/A)  Subjective: Patient eating grits this am. She does have incisional, sternal pain and nausea this am. She has already walked this am.  Objective: Vital signs in last 24 hours: Temp:  [97.9 F (36.6 C)-98.4 F (36.9 C)] 97.9 F (36.6 C) (01/29 0400) Pulse Rate:  [60-83] 83 (01/29 0600) Cardiac Rhythm: Normal sinus rhythm (01/29 0000) Resp:  [8-22] 18 (01/29 0600) BP: (88-145)/(29-103) 144/60 (01/29 0600) SpO2:  [80 %-96 %] 91 % (01/29 0600) Arterial Line BP: (68-181)/(32-135) 123/40 (01/28 1100) Weight:  [67.4 kg] 67.4 kg (01/29 0500)  Pre op weight 77.2 kg Current Weight  10/08/23 67.4 kg    Hemodynamic parameters for last 24 hours: CVP:  [6 mmHg-19 mmHg] 10 mmHg CO:  [4.5 L/min-6.1 L/min] 5.6 L/min CI:  [2.6 L/min/m2-3.5 L/min/m2] 3.2 L/min/m2  Intake/Output from previous day: 01/28 0701 - 01/29 0700 In: 687.4 [I.V.:537.3; IV Piggyback:150.1] Out: 690 [Urine:365; Chest Tube:325]   Physical Exam:  Cardiovascular: RRR Pulmonary: Diminished bibasilar breath sounds Abdomen: Soft, non tender, occasional bowel sounds present. Extremities: Mild bilateral lower extremity edema. Wounds: RLE and LLE wounds are clean and dry.  No erythema or signs of infection. Prevena intact on sternal wound.  Lab Results: CBC: Recent Labs    10/07/23 1629 10/08/23 0436  WBC 12.3* 12.4*  HGB 9.6* 9.3*  HCT 29.4* 29.4*  PLT 139* 137*   BMET:  Recent Labs    10/07/23 1629 10/08/23 0436  NA 139 137  K 4.7 5.1  CL 105 106  CO2 24 25  GLUCOSE 143* 126*  BUN 12 21  CREATININE 0.89 1.18*  CALCIUM 8.4* 8.4*    PT/INR:  Lab Results  Component Value  Date   INR 1.6 (H) 10/06/2023   INR 1.0 10/02/2023   ABG:  INR: Will add last result for INR, ABG once components are confirmed Will add last 4 CBG results once components are confirmed  Assessment/Plan:  1. CV - SR. On Lopressor 12.5 mg bid. 2.  Pulmonary - On HFNC this am. She may have undiagnosed COPD. Continue nebs as ordered per pulmonary. Chest tubes with 325 cc last 24 hours. Check CXR. As discussed with Dr. Cliffton Asters, will remove chest tubes. Encourage incentive spirometer. 3. Above pre op weight, requires further diuresis-will give Lasix 40 mg PO this am. Will not supplement potassium as is 5.1 this am 4.  Expected post op acute blood loss anemia - H and H this am stable at 9.3 and 29.4 5. CBGs 125/99/105. Pre op HGA1C 6. She has pre diabetes. Will stop accu checks and SS PRN after transfer to the floor. Will provide nutrition information with discharge paperwork.  6. Thrombocytopenia-platelets this am 137,000. Monitor 7. GI-will give scheduled Reglan for nausea, continue Zofran PRN 8. As discussed with Dr. Cliffton Asters, transfer to 4E  Donielle M ZimmermanPA-C 7:16 AM  Agree with above Will remove chest tube Floor today  Corliss Skains

## 2023-10-08 NOTE — Progress Notes (Signed)
NAME:  Megan Pham, MRN:  [id number], DOB:  1946-08-23, LOS: 2 ADMISSION DATE:  10/06/2023, CONSULTATION DATE:  1/27 REFERRING MD:  Cliffton Asters, CHIEF COMPLAINT:  post-CABG   History of Present Illness:  78 year old female with PMH as below, which is significant for carotid stenosis, HTN, HLD, and CAD. Physical activity has become limited by dyspnea over a period of a few months. She became unable to walk to and from her mailbox without significant dyspnea. Also complained of exertional chest tightness intermittently. She underwent coronary CT screening for CAD with her cardiologist with concerning findings. She was referred for LHC demonstrated triple vessel disease including CTO RCA; no LM disease. Echo demonstrated preserved ejection fraction. She was referred to thoracic surgery and was felt to have good distal targets for bypass grafting. She presented to [facility] for CABG 1/27. Surgery was tolerated well with no immediate complications and the patient was transferred to the CVICU postoperatively for ongoing care. PCCM consulted for medical and ventilator management.   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   Significant Hospital Events: Including procedures, antibiotic start and stop dates in addition to other pertinent events   1/27 CABG x 4. LIMA LAD, RSVG PDA, OM1, OM2  1/28 less than 400 UOP  1/29 lasix txf our of ICU   Interim History / Subjective:  NAEO   Not making great urine (365 yesterday) 325 from chest tube yesterday    Objective   Blood pressure (!) 122/46, pulse 63, temperature 97.9 F (36.6  C), temperature source Oral, resp. rate 11, height 5' (1.524 m), weight 67.4 kg, SpO2 94%. CVP:  [8 mmHg-15 mmHg] 10 mmHg CO:  [5 L/min-5.8 L/min] 5.6 L/min CI:  [2.8 L/min/m2-3.3 L/min/m2] 3.2 L/min/m2      Intake/Output Summary (Last 24 hours) at 10/08/2023 0957 Last data filed at 10/08/2023 0900 Gross per 24 hour  Intake 206.34 ml  Output 700 ml  Net -493.66 ml   Filed Weights   10/06/23 0613 10/07/23 0547 10/08/23 0500  Weight: 77.2 kg 81 kg 67.4 kg    Examination: General: obese elderly F NAD  HENT: NCAT pink mm  Lungs: Shallow respirations, diminished basilar sounds  Cardiovascular: rrr cap refill < 3 sec. Chest tube w serosang output   Abdomen: soft ndnt  Extremities: no acute joint deformity  Neuro: AAOx3  GU: foley    Resolved Hospital Problem list    Vasoplegia   Assessment & Plan:   S/p CABG x4  3v CAD NSTEMI  Hx HTN  P -L/T/D per CVTS -transferring out of ICU 1/29  -lasix  -metop 12.5 BID  -crestor   AKI  P -diurese 1/29 -follow UOP renal indices   L apical ptx  Possible undx COPD  Hx tobacco use  P -CXR is pending  -wean O2 for goal > 92 -cont nebs  -pulm hygiene -- spent a long time talking w her about using IS   Expected ABLA, thrombocytopenia  -- stable  -PRN CBC   Hyperglycemia  -SSI       Best Practice (right click and "Reselect all SmartList Selections" daily)   Diet/type: Reg  DVT prophylaxis LMWH Pressure ulcer(s): N/A GI prophylaxis: PPI Lines: CVC  Foley:  yest  Code Status:  full code Last date of multidisciplinary goals of care discussion [ per primary ]  Labs   CBC: Recent Labs  Lab 10/06/23 1350 10/06/23 1352 10/06/23 1859 10/06/23 2000 10/07/23 0309 10/07/23 1629 10/08/23 0436  WBC 14.8*  --   --  9.8 10.5 12.3* 12.4*  HGB 10.4*   < > 9.9* 9.6* 9.3* 9.6* 9.3*  HCT 31.2*   < > 29.0* 29.0* 28.3* 29.4* 29.4*  MCV 91.0  --   --  89.8 89.8 91.9 93.3  PLT 136*  --   --  130* 142* 139* 137*   < > =  values in this interval not displayed.    Basic Metabolic Panel: Recent Labs  Lab 10/02/23 1500 10/06/23 0800 10/06/23 1249 10/06/23 1352 10/06/23 1859 10/06/23 2000 10/07/23 0309 10/07/23 1629 10/08/23 0436  NA 137   < > 142   < > 144 141 139 139 137  K 4.7   < > 4.1   < > 3.9 4.0 3.8 4.7 5.1  CL 103   < > 108  --   --  109 108 105 106  CO2 26  --   --   --   --  23 23 24 25   GLUCOSE 118*   < > 144*  --   --  159* 120* 143* 126*  BUN 13   < > 8  --   --  6* 7* 12 21  CREATININE 0.68   < > 0.50  --   --  0.74 0.55 0.89 1.18*  CALCIUM 9.4  --   --   --   --  7.9* 7.9* 8.4* 8.4*  MG  --   --   --   --   --  3.3* 2.8* 2.6*  --    < > = values in this interval not displayed.   GFR: Estimated Creatinine Clearance: 34.2 mL/min (A) (by C-G formula based on SCr of 1.18 mg/dL (H)). Recent Labs  Lab 10/06/23 2000 10/07/23 0309 10/07/23 1629 10/08/23 0436  WBC 9.8 10.5 12.3* 12.4*    Liver Function Tests: Recent Labs  Lab 10/02/23 1500  AST 25  ALT 24  ALKPHOS 102  BILITOT 0.5  PROT 7.6  ALBUMIN 4.1     Mod MDM    Tessie Fass MSN, AGACNP-BC [place] [facility]/Critical [facility] Medicine Amion for pager  10/08/2023, 9:57 AM

## 2023-10-09 ENCOUNTER — Inpatient Hospital Stay (HOSPITAL_COMMUNITY): Payer: 59

## 2023-10-09 DIAGNOSIS — D62 Acute posthemorrhagic anemia: Secondary | ICD-10-CM | POA: Diagnosis not present

## 2023-10-09 DIAGNOSIS — J95811 Postprocedural pneumothorax: Secondary | ICD-10-CM

## 2023-10-09 DIAGNOSIS — N179 Acute kidney failure, unspecified: Secondary | ICD-10-CM | POA: Diagnosis not present

## 2023-10-09 DIAGNOSIS — J9601 Acute respiratory failure with hypoxia: Secondary | ICD-10-CM | POA: Diagnosis not present

## 2023-10-09 DIAGNOSIS — R739 Hyperglycemia, unspecified: Secondary | ICD-10-CM | POA: Diagnosis not present

## 2023-10-09 LAB — BASIC METABOLIC PANEL
Anion gap: 8 (ref 5–15)
BUN: 35 mg/dL — ABNORMAL HIGH (ref 8–23)
CO2: 24 mmol/L (ref 22–32)
Calcium: 8.4 mg/dL — ABNORMAL LOW (ref 8.9–10.3)
Chloride: 103 mmol/L (ref 98–111)
Creatinine, Ser: 1.18 mg/dL — ABNORMAL HIGH (ref 0.44–1.00)
GFR, Estimated: 48 mL/min — ABNORMAL LOW (ref >60)
Glucose, Bld: 117 mg/dL — ABNORMAL HIGH (ref 70–99)
Potassium: 4.9 mmol/L (ref 3.5–5.1)
Sodium: 135 mmol/L (ref 135–145)

## 2023-10-09 LAB — CBC
HCT: 29.5 % — ABNORMAL LOW (ref 36.0–46.0)
Hemoglobin: 9.4 g/dL — ABNORMAL LOW (ref 12.0–15.0)
MCH: 29.7 pg (ref 26.0–34.0)
MCHC: 31.9 g/dL (ref 30.0–36.0)
MCV: 93.1 fL (ref 80.0–100.0)
Platelets: 155 10*3/uL (ref 150–400)
RBC: 3.17 MIL/uL — ABNORMAL LOW (ref 3.87–5.11)
RDW: 13 % (ref 11.5–15.5)
WBC: 13.3 10*3/uL — ABNORMAL HIGH (ref 4.0–10.5)
nRBC: 0 % (ref 0.0–0.2)

## 2023-10-09 LAB — MAGNESIUM: Magnesium: 2.7 mg/dL — ABNORMAL HIGH (ref 1.7–2.4)

## 2023-10-09 LAB — GLUCOSE, CAPILLARY
Glucose-Capillary: 104 mg/dL — ABNORMAL HIGH (ref 70–99)
Glucose-Capillary: 108 mg/dL — ABNORMAL HIGH (ref 70–99)
Glucose-Capillary: 108 mg/dL — ABNORMAL HIGH (ref 70–99)
Glucose-Capillary: 116 mg/dL — ABNORMAL HIGH (ref 70–99)
Glucose-Capillary: 125 mg/dL — ABNORMAL HIGH (ref 70–99)

## 2023-10-09 LAB — LIPID PANEL
Cholesterol: 62 mg/dL (ref 0–200)
HDL: 35 mg/dL — ABNORMAL LOW (ref >40)
LDL Cholesterol: 13 mg/dL (ref 0–99)
Total CHOL/HDL Ratio: 1.8 {ratio}
Triglycerides: 68 mg/dL (ref <150)
VLDL: 14 mg/dL (ref 0–40)

## 2023-10-09 MED ORDER — FUROSEMIDE 10 MG/ML IJ SOLN
40.0000 mg | Freq: Once | INTRAMUSCULAR | Status: AC
  Administered 2023-10-09: 40 mg via INTRAVENOUS
  Filled 2023-10-09: qty 4

## 2023-10-09 MED ORDER — SORBITOL 70 % SOLN
30.0000 mL | Freq: Once | Status: AC
  Administered 2023-10-09: 30 mL via ORAL
  Filled 2023-10-09: qty 30

## 2023-10-09 MED ORDER — SENNA 8.6 MG PO TABS
2.0000 | ORAL_TABLET | Freq: Two times a day (BID) | ORAL | Status: DC
  Administered 2023-10-09 – 2023-10-16 (×5): 17.2 mg via ORAL
  Filled 2023-10-09 (×8): qty 2

## 2023-10-09 MED ORDER — ENOXAPARIN SODIUM 40 MG/0.4ML IJ SOSY
40.0000 mg | PREFILLED_SYRINGE | INTRAMUSCULAR | Status: DC
  Administered 2023-10-09 – 2023-10-16 (×8): 40 mg via SUBCUTANEOUS
  Filled 2023-10-09 (×9): qty 0.4

## 2023-10-09 MED ORDER — ENOXAPARIN SODIUM 40 MG/0.4ML IJ SOSY: 40.0000 mg | PREFILLED_SYRINGE | INTRAMUSCULAR | Status: DC

## 2023-10-09 MED ORDER — FUROSEMIDE 10 MG/ML IJ SOLN: 40.0000 mg | Freq: Once | INTRAMUSCULAR | Status: DC

## 2023-10-09 MED ORDER — POLYETHYLENE GLYCOL 3350 17 G PO PACK
17.0000 g | PACK | Freq: Two times a day (BID) | ORAL | Status: DC
  Administered 2023-10-09 – 2023-10-16 (×4): 17 g via ORAL
  Filled 2023-10-09 (×8): qty 1

## 2023-10-09 MED ORDER — FUROSEMIDE 40 MG PO TABS
40.0000 mg | ORAL_TABLET | Freq: Every day | ORAL | Status: DC
  Administered 2023-10-10: 40 mg via ORAL
  Filled 2023-10-09: qty 1

## 2023-10-09 NOTE — Plan of Care (Signed)
  Problem: Clinical Measurements: Goal: Cardiovascular complication will be avoided Outcome: Progressing   Problem: Activity: Goal: Risk for activity intolerance will decrease Outcome: Progressing   Problem: Nutrition: Goal: Adequate nutrition will be maintained Outcome: Progressing   Problem: Coping: Goal: Level of anxiety will decrease Outcome: Progressing   Problem: Elimination: Goal: Will not experience complications related to bowel motility Outcome: Progressing Goal: Will not experience complications related to urinary retention Outcome: Progressing   Problem: Pain Managment: Goal: General experience of comfort will improve and/or be controlled Outcome: Progressing   Problem: Safety: Goal: Ability to remain free from injury will improve Outcome: Progressing   Problem: Cardiac: Goal: Will achieve and/or maintain hemodynamic stability Outcome: Progressing

## 2023-10-09 NOTE — Progress Notes (Signed)
      [address]       [place] [address]             [phone]      POD # 3 CABG  Up in chair  BP (!) 113/38 (BP Location: Right Arm)   Pulse 73   Temp 97.6 F (36.4 C) (Oral)   Resp 11   Ht 5' (1.524 m)   Wt 67.1 kg   SpO2 98%   BMI 28.89 kg/m    Intake/Output Summary (Last 24 hours) at 10/09/2023 1747 Last data filed at 10/09/2023 1247 Gross per 24 hour  Intake 3 ml  Output 1550 ml  Net -1547 ml   CT in place  Awaiting bed on 4E  Krislynn Gronau C. Dorris Fetch, MD [facility] [phone]

## 2023-10-09 NOTE — TOC Initial Note (Signed)
Transition of Care [facility]) - Initial/Assessment Note    Patient Details  Name: Megan Pham MRN: [id number] Date of Birth: 1945-12-18  Transition of Care [facility]) CM/SW Contact:    Gala Lewandowsky, RN Phone Number: 10/09/2023, 2:46 PM  Clinical Narrative:  Patient POD-3 CABG. PTA patient was independent from home alone. Patient states she will be returning home with her friend Megan Pham. Case Manager received verbal permission to call Megan and she is agreeable to patient coming to her home. [place] Qu[place] address is [address] [place]  [address]. Megan is concerned with patients mobility. Patient will benefit from PT/OT consult for recommendations and DME needs. Patient has been prearranged via the office protocol for [facility] PT/OT with [facility]. Case Manager will continue to follow for additional transition of care needs as the patient progresses.              Expected Discharge Plan: Home w Home Health Services Barriers to Discharge: Continued Medical Work up   Patient Goals and CMS Choice Patient states their goals for this hospitalization and ongoing recovery are:: patient states she wants to return to her friends Megan's home.  Expected Discharge Plan and Services   Discharge Planning Services: CM Consult Post Acute Care Choice: Home Health Living arrangements for the past 2 months: Single Family Home  Prior Living Arrangements/Services Living arrangements for the past 2 months: Single Family Home Lives with:: Self Patient language and need for interpreter reviewed:: Yes Do you feel safe going back to the place where you live?: Yes      Need for Family Participation in Patient Care: Yes (Comment) Care giver support system in place?: Yes (comment)   Criminal Activity/Legal Involvement Pertinent to Current Situation/Hospitalization: No - Comment as needed  Activities of Daily Living   ADL Screening (condition at time of admission) Independently performs  ADLs?: Yes (appropriate for developmental age) Is the patient deaf or have difficulty hearing?: Yes Does the patient have difficulty seeing, even when wearing glasses/contacts?: Yes Does the patient have difficulty concentrating, remembering, or making decisions?: No  Permission Sought/Granted Permission sought to share information with : Case Manager, Family Supports (Friend Megan Pham) Permission granted to share information with : Yes, Verbal Permission Granted              Emotional Assessment Appearance:: Appears stated age Attitude/Demeanor/Rapport: Unable to Assess Affect (typically observed): Unable to Assess Orientation: : Oriented to Self, Oriented to Place Alcohol / Substance Use: Not Applicable Psych Involvement: No (comment)  Admission diagnosis:  Coronary artery disease [I25.10] Patient Active Problem List   Diagnosis Date Noted   AKI (acute kidney injury) (HCC) 10/08/2023   Pleural effusion 10/08/2023   Acute pulmonary edema (HCC) 10/08/2023   ABLA (acute blood loss anemia) 10/08/2023   S/P CABG x 4 10/06/2023   Coronary artery disease 10/06/2023   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   Genetic testing 06/18/2022   Essential hypertension 06/11/2022   Family history of breast cancer 05/27/2022   Family history of gastric cancer 05/27/2022   Family history of CTNNA1 mutation  05/27/2022   Dyslipidemia 11/02/2021   Pseudoptosis 11/01/2021   Vaginal irritation 03/02/2018   Osteopenia 11/28/2016   Osteoarthritis 10/11/2010   PCP:  Beam, Chales Salmon, MD Pharmacy:   [id number] - [place], [place] - [address] [place] [address] Phone: [phone] Fax: [phone]     Social Drivers  of Health (SDOH) Social History: SDOH Screenings   Food Insecurity: No Food Insecurity (10/07/2023)  Housing: Low Risk  (10/07/2023)  Transportation Needs: No Transportation Needs (10/07/2023)  Utilities:  Not At Risk (10/07/2023)  Depression (PHQ2-9): Low Risk  (06/11/2022)  Financial Resource Strain: Low Risk  (04/08/2023)   Received from [facility]  Physical Activity: Sufficiently Active (04/08/2023)   Received from [facility]  Social Connections: Moderately Isolated (10/07/2023)  Stress: No Stress Concern Present (04/08/2023)   Received from [facility]  Tobacco Use: Medium Risk (10/06/2023)   SDOH Interventions:     Readmission Risk Interventions     No data to display

## 2023-10-09 NOTE — Progress Notes (Signed)
NAME:  Megan Pham, MRN:  [id number], DOB:  03-23-1946, LOS: 3 ADMISSION DATE:  10/06/2023, CONSULTATION DATE:  1/27 REFERRING MD:  Cliffton Asters, CHIEF COMPLAINT:  post-CABG   History of Present Illness:  78 year old female with PMH as below, which is significant for carotid stenosis, HTN, HLD, and CAD. Physical activity has become limited by dyspnea over a period of a few months. She became unable to walk to and from her mailbox without significant dyspnea. Also complained of exertional chest tightness intermittently. She underwent coronary CT screening for CAD with her cardiologist with concerning findings. She was referred for LHC demonstrated triple vessel disease including CTO RCA; no LM disease. Echo demonstrated preserved ejection fraction. She was referred to thoracic surgery and was felt to have good distal targets for bypass grafting. She presented to [facility] for CABG 1/27. Surgery was tolerated well with no immediate complications and the patient was transferred to the CVICU postoperatively for ongoing care. PCCM consulted for medical and ventilator management.   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   Significant Hospital Events: Including procedures, antibiotic start and stop dates in addition to other pertinent events   1/27 CABG x 4. LIMA LAD, RSVG PDA, OM1, OM2  1/28 less than 400 UOP  1/29 lasix txf our of ICU   Interim History / Subjective:  NAEO   Not making great urine (365 yesterday) 325 from chest tube yesterday    Objective   Blood pressure (!) 105/40, pulse 77, temperature 97.8 F (36.6  C), temperature source Axillary, resp. rate (!) 9, height 5' (1.524 m), weight 67.1 kg, SpO2 97%.        Intake/Output Summary (Last 24 hours) at 10/09/2023 0942 Last data filed at 10/09/2023 0900 Gross per 24 hour  Intake 3 ml  Output 1156 ml  Net -1153 ml   Filed Weights   10/07/23 0547 10/08/23 0500 10/09/23 0500  Weight: 81 kg 67.4 kg 67.1 kg    Examination: General: \obese elderly F  HENT: NCAT.  Lungs: Cannot appreciate L sided sounds. Trap muscle use   Cardiovascular. S1s2 decreased heart tones  Abdomen: soft obese  Extremities: no acute joint deformity  Neuro: AAOx3  GU: foley    Resolved Hospital Problem list    Vasoplegia   Assessment & Plan:    S/p CABG x4 3v CAD NSTEMI Hx HTN  P -L/T/D per CVTS -- see chest tube plans below  -lasix 1/30  -metop 12.5 BID  -crestor   Enlarging L ptx, post procedural   -was removed 1/29, progressively has incr in size. See discussions 1/29 evening. Initially d/w on call CVTS had rec replacing chest tube, pt would not consent. Ultimately d/w primary CVTS --rec deferring and watching.  P -She needs to have her chest tube replaced. CVTS plans to replace   AKI, stable  P -follow UOP renal indices  -rec repeated diuresis 1/30   Possible undx COPD  Hx tobacco use  -cont nebs -pulm hygiene   Expected ABLA, thrombocytopenia  -- stable  -PRN CBC   Hyperglycemia  -SSI    Best Practice (right click and "Reselect all SmartList Selections" daily)   Diet/type: Reg  DVT prophylaxis LMWH Pressure ulcer(s):  N/A GI prophylaxis: PPI Lines: CVC   Foley:  yest  Code Status:  full code Last date of multidisciplinary goals of care discussion [ per primary ]  Labs   CBC: Recent Labs  Lab 10/06/23 2000 10/07/23 0309 10/07/23 1629 10/08/23 0436 10/09/23 0217  WBC 9.8 10.5 12.3* 12.4* 13.3*  HGB 9.6* 9.3* 9.6* 9.3* 9.4*  HCT 29.0* 28.3* 29.4* 29.4* 29.5*  MCV 89.8 89.8 91.9 93.3 93.1  PLT 130* 142* 139* 137* 155     Basic Metabolic Panel: Recent Labs  Lab 10/06/23 2000 10/07/23 0309 10/07/23 1629 10/08/23 0436 10/09/23 0217  NA 141 139 139 137 135  K 4.0 3.8 4.7 5.1 4.9  CL 109 108 105 106 103  CO2 23 23 24 25 24   GLUCOSE 159* 120* 143* 126* 117*  BUN 6* 7* 12 21 35*  CREATININE 0.74 0.55 0.89 1.18* 1.18*  CALCIUM 7.9* 7.9* 8.4* 8.4* 8.4*  MG 3.3* 2.8* 2.6*  --  2.7*   GFR: Estimated Creatinine Clearance: 34.1 mL/min (A) (by C-G formula based on SCr of 1.18 mg/dL (H)). Recent Labs  Lab 10/07/23 0309 10/07/23 1629 10/08/23 0436 10/09/23 0217  WBC 10.5 12.3* 12.4* 13.3*    Liver Function Tests: Recent Labs  Lab 10/02/23 1500  AST 25  ALT 24  ALKPHOS 102  BILITOT 0.5  PROT 7.6  ALBUMIN 4.1     Mod MDM    Tessie Fass MSN, AGACNP-BC [facility] Pulmonary/Critical Care Medicine Amion for pager 10/09/2023, 9:42 AM

## 2023-10-09 NOTE — Procedures (Signed)
Insertion of Chest Tube Procedure Note  Megan Pham  [id number]  02-Sep-1946  Date:10/09/23  Time:11:53 AM    Provider Performing: Corliss Skains   Procedure: Chest Tube Insertion (32551)  Indication(s) Pneumothorax  Consent Risks of the procedure as well as the alternatives and risks of each were explained to the patient and/or caregiver.  Consent for the procedure was obtained and is signed in the bedside chart  Anesthesia Topical only with 1% lidocaine    Time Out Verified patient identification, verified procedure, site/side was marked, verified correct patient position, special equipment/implants available, medications/allergies/relevant history reviewed, required imaging and test results available.   Sterile Technique Maximal sterile technique including full sterile barrier drape, hand hygiene, sterile gown, sterile gloves, Pham, hair covering, sterile ultrasound probe cover (if used).   Procedure Description Ultrasound not used to identify appropriate pleural anatomy for placement and overlying skin marked. Area of placement cleaned and draped in sterile fashion.  A 14 French pigtail pleural catheter was placed into the left pleural space using Seldinger technique. Appropriate return of air was obtained.  The tube was connected to atrium and placed on -20 cm H2O wall suction.   Complications/Tolerance None; patient tolerated the procedure well. Chest X-ray is ordered to verify placement.   EBL Minimal  Specimen(s) none

## 2023-10-09 NOTE — Progress Notes (Addendum)
      [address]       [place] [address]             [phone]        3 Days Post-Op Procedure(s) (LRB): CORONARY ARTERY BYPASS GRAFTING (CABG) TIMES FOUR USING LEFT INTERNAL MAMMARY ARTERY AND BILATERALLY ENDOSCOPICALLY HARVESTED GREATER SAPHENOUS VEINS (N/A) TRANSESOPHAGEAL ECHOCARDIOGRAM (TEE) (N/A)  Subjective: Patient sitting in chair. Her breathing effort has worsened since yesterday. I discussed the importance of placing a chest tube (apparently, she refused yesterday) and she now seems agreeable. She still has nausea, no bowel movement.  Objective: Vital signs in last 24 hours: Temp:  [97.9 F (36.6 C)-98.5 F (36.9 C)] 97.9 F (36.6 C) (01/29 2330) Pulse Rate:  [59-72] 66 (01/30 0600) Cardiac Rhythm: Normal sinus rhythm (01/29 2000) Resp:  [7-16] 12 (01/30 0600) BP: (93-130)/(34-76) 121/43 (01/30 0600) SpO2:  [82 %-96 %] 93 % (01/30 0600) FiO2 (%):  [0 %] 0 % (01/29 0804) Weight:  [67.1 kg] 67.1 kg (01/30 0500)  Pre op weight 77.2 kg Current Weight  10/09/23 67.1 kg      Intake/Output from previous day: 01/29 0701 - 01/30 0700 In: 3 [I.V.:3] Out: 851 [Urine:810; Emesis/NG output:1; Chest Tube:40]   Physical Exam:  Cardiovascular: RRR Pulmonary: Very diminished breath sounds on the left with extensive subcutaneous emphysema left chest wall. Right lung is clear. Abdomen: Soft, non tender, occasional bowel sounds present. Extremities: Mild bilateral lower extremity edema. Wounds: RLE and LLE wounds are clean and dry.  No erythema or signs of infection. Prevena intact on sternal wound.  Lab Results: CBC: Recent Labs    10/08/23 0436 10/09/23 0217  WBC 12.4* 13.3*  HGB 9.3* 9.4*  HCT 29.4* 29.5*  PLT 137* 155   BMET:  Recent Labs    10/08/23 0436 10/09/23 0217  NA 137 135  K 5.1 4.9  CL 106 103  CO2 25 24  GLUCOSE 126* 117*  BUN 21 35*  CREATININE 1.18* 1.18*  CALCIUM 8.4* 8.4*    PT/INR:  Lab Results  Component  Value Date   INR 1.6 (H) 10/06/2023   INR 1.0 10/02/2023   ABG:  INR: Will add last result for INR, ABG once components are confirmed Will add last 4 CBG results once components are confirmed  Assessment/Plan:  1. CV - SR. On Lopressor 12.5 mg bid. 2.  Pulmonary - On 10L HFNC this am. She may have undiagnosed COPD. Continue nebs as ordered per pulmonary. Chest tubes removed yesterday. CXR this am shows enlarging left pneumothorax with extensive subcutaneous emphysema, bibasilar atelectasis.She needs left chest tube. Encourage incentive spirometer and flutter valve. 3. Above pre op weight, requires further diuresis-will give Lasix 40 mg IV this am. Will not supplement potassium as is 4.9 this am 4.  Expected post op acute blood loss anemia - H and H this am stable at 9.4 and 29.5 5. CBGs 141/108/104. Pre op HGA1C 6. She has pre diabetes. Will stop accu checks and SS PRN after transfer to the floor. Will provide nutrition information with discharge paperwork.  6. Thrombocytopenia resolved-platelets this am 155,000.  7. GI-continue scheduled Reglan for nausea, continue Zofran PRN. LOC constipation.    Donielle M ZimmermanPA-C 7:00 AM   Agree with above CT placed Will transfer to floor  Salik Grewell O Keyani Rigdon

## 2023-10-10 ENCOUNTER — Inpatient Hospital Stay (HOSPITAL_COMMUNITY): Payer: 59

## 2023-10-10 DIAGNOSIS — I480 Paroxysmal atrial fibrillation: Secondary | ICD-10-CM

## 2023-10-10 DIAGNOSIS — I25799 Atherosclerosis of other coronary artery bypass graft(s) with unspecified angina pectoris: Secondary | ICD-10-CM | POA: Diagnosis not present

## 2023-10-10 DIAGNOSIS — J939 Pneumothorax, unspecified: Secondary | ICD-10-CM | POA: Diagnosis not present

## 2023-10-10 DIAGNOSIS — N179 Acute kidney failure, unspecified: Secondary | ICD-10-CM | POA: Diagnosis not present

## 2023-10-10 DIAGNOSIS — I4892 Unspecified atrial flutter: Secondary | ICD-10-CM

## 2023-10-10 DIAGNOSIS — I9789 Other postprocedural complications and disorders of the circulatory system, not elsewhere classified: Secondary | ICD-10-CM

## 2023-10-10 LAB — TYPE AND SCREEN
ABO/RH(D): O POS
Antibody Screen: NEGATIVE
Unit division: 0
Unit division: 0
Unit division: 0
Unit division: 0
Unit division: 0
Unit division: 0

## 2023-10-10 LAB — BPAM RBC
Blood Product Expiration Date: 202502132359
Blood Product Expiration Date: 202502152359
Blood Product Expiration Date: 202502212359
Blood Product Expiration Date: 202502212359
Blood Product Expiration Date: 202502232359
Blood Product Expiration Date: 202502232359
ISSUE DATE / TIME: 202501271038
ISSUE DATE / TIME: 202501271038
ISSUE DATE / TIME: 202501272336
ISSUE DATE / TIME: 202501281413
Unit Type and Rh: 5100
Unit Type and Rh: 5100
Unit Type and Rh: 5100
Unit Type and Rh: 5100
Unit Type and Rh: 5100
Unit Type and Rh: 5100

## 2023-10-10 LAB — MAGNESIUM: Magnesium: 2.6 mg/dL — ABNORMAL HIGH (ref 1.7–2.4)

## 2023-10-10 LAB — CBC
HCT: 30.4 % — ABNORMAL LOW (ref 36.0–46.0)
Hemoglobin: 9.8 g/dL — ABNORMAL LOW (ref 12.0–15.0)
MCH: 29.7 pg (ref 26.0–34.0)
MCHC: 32.2 g/dL (ref 30.0–36.0)
MCV: 92.1 fL (ref 80.0–100.0)
Platelets: 223 10*3/uL (ref 150–400)
RBC: 3.3 MIL/uL — ABNORMAL LOW (ref 3.87–5.11)
RDW: 12.8 % (ref 11.5–15.5)
WBC: 11.5 10*3/uL — ABNORMAL HIGH (ref 4.0–10.5)
nRBC: 0 % (ref 0.0–0.2)

## 2023-10-10 LAB — BASIC METABOLIC PANEL
Anion gap: 7 (ref 5–15)
BUN: 32 mg/dL — ABNORMAL HIGH (ref 8–23)
CO2: 28 mmol/L (ref 22–32)
Calcium: 8.4 mg/dL — ABNORMAL LOW (ref 8.9–10.3)
Chloride: 105 mmol/L (ref 98–111)
Creatinine, Ser: 1.05 mg/dL — ABNORMAL HIGH (ref 0.44–1.00)
GFR, Estimated: 55 mL/min — ABNORMAL LOW (ref >60)
Glucose, Bld: 125 mg/dL — ABNORMAL HIGH (ref 70–99)
Potassium: 4 mmol/L (ref 3.5–5.1)
Sodium: 140 mmol/L (ref 135–145)

## 2023-10-10 LAB — GLUCOSE, CAPILLARY
Glucose-Capillary: 103 mg/dL — ABNORMAL HIGH (ref 70–99)
Glucose-Capillary: 114 mg/dL — ABNORMAL HIGH (ref 70–99)
Glucose-Capillary: 111 mg/dL — ABNORMAL HIGH (ref 70–99)
Glucose-Capillary: 143 mg/dL — ABNORMAL HIGH (ref 70–99)
Glucose-Capillary: 112 mg/dL — ABNORMAL HIGH (ref 70–99)

## 2023-10-10 MED ORDER — AMIODARONE LOAD VIA INFUSION
150.0000 mg | Freq: Once | INTRAVENOUS | Status: DC
  Filled 2023-10-10: qty 83.34

## 2023-10-10 MED ORDER — AMIODARONE HCL IN DEXTROSE 360-4.14 MG/200ML-% IV SOLN: 30.0000 mg/h | INTRAVENOUS | Status: DC

## 2023-10-10 MED ORDER — OXYCODONE HCL 5 MG PO TABS
5.0000 mg | ORAL_TABLET | ORAL | Status: DC | PRN
Start: 2023-10-10 — End: 2023-10-11
  Administered 2023-10-10 (×2): 5 mg via ORAL
  Filled 2023-10-10 (×2): qty 1

## 2023-10-10 MED ORDER — METOPROLOL SUCCINATE ER 25 MG PO TB24
12.5000 mg | ORAL_TABLET | Freq: Two times a day (BID) | ORAL | Status: DC
  Administered 2023-10-10: 12.5 mg via ORAL
  Filled 2023-10-10: qty 1

## 2023-10-10 MED ORDER — POTASSIUM CHLORIDE CRYS ER 20 MEQ PO TBCR
20.0000 meq | EXTENDED_RELEASE_TABLET | Freq: Every day | ORAL | Status: DC
  Administered 2023-10-10: 20 meq via ORAL
  Filled 2023-10-10: qty 1

## 2023-10-10 MED ORDER — AMIODARONE HCL IN DEXTROSE 360-4.14 MG/200ML-% IV SOLN
60.0000 mg/h | INTRAVENOUS | Status: DC
  Administered 2023-10-10 – 2023-10-11 (×2): 60 mg/h via INTRAVENOUS
  Filled 2023-10-10 (×2): qty 200

## 2023-10-10 MED ORDER — AMIODARONE LOAD VIA INFUSION
150.0000 mg | Freq: Once | INTRAVENOUS | Status: AC
  Administered 2023-10-10: 150 mg via INTRAVENOUS
  Filled 2023-10-10: qty 83.34

## 2023-10-10 MED ORDER — AMIODARONE HCL IN DEXTROSE 360-4.14 MG/200ML-% IV SOLN: 60.0000 mg/h | INTRAVENOUS | Status: DC

## 2023-10-10 MED ORDER — MELATONIN 3 MG PO TABS
3.0000 mg | ORAL_TABLET | Freq: Every day | ORAL | Status: DC
Start: 2023-10-10 — End: 2023-10-18
  Administered 2023-10-10 – 2023-10-17 (×8): 3 mg via ORAL
  Filled 2023-10-10 (×8): qty 1

## 2023-10-10 MED ORDER — AMIODARONE HCL 200 MG PO TABS
400.0000 mg | ORAL_TABLET | Freq: Two times a day (BID) | ORAL | Status: DC
  Administered 2023-10-10 – 2023-10-18 (×17): 400 mg via ORAL
  Filled 2023-10-10 (×18): qty 2

## 2023-10-10 MED ORDER — QUETIAPINE FUMARATE 50 MG PO TABS
50.0000 mg | ORAL_TABLET | Freq: Every day | ORAL | Status: DC
  Administered 2023-10-10: 50 mg via ORAL
  Filled 2023-10-10: qty 1

## 2023-10-10 MED ORDER — GUAIFENESIN ER 600 MG PO TB12
600.0000 mg | ORAL_TABLET | Freq: Two times a day (BID) | ORAL | Status: DC
  Administered 2023-10-10 – 2023-10-18 (×17): 600 mg via ORAL
  Filled 2023-10-10 (×17): qty 1

## 2023-10-10 NOTE — Progress Notes (Signed)
Notified CCM about subcutaneous air d/t concern of slight changes of air location, MD at bedside. Chest xray ordered and reviewed, no changes at this time. Pt respiratory assessment unchanged from previous assessments, still on 4L [place].

## 2023-10-10 NOTE — Evaluation (Signed)
Occupational Therapy Evaluation Patient Details Name: Megan Pham MRN: [id number] DOB: 11/23/1945 Today's Date: 10/10/2023   History of Present Illness Pt is a 78 y/o female admitted for CABG x 4 1/27 in setting of CAD. Found to have L pneumothorax and required chest tube placement. PMH: CAD, HTN, OA, HLD   Clinical Impression   PTA, pt lives alone and typically completely Independent in all ADLs, IADLs and mobility without AD. Pt presents now with deficits in pain, cardiopulmonary endurance, cognition (suspect due to pain meds), strength and balance. Pt requires Mod A x 2 for bed mobility, Min A x 2 for transfers with RW. Pt requires Min A for UB ADL and up to Max A for LB ADLs. Began education on sternal precautions for ADLs and transfers w/ continued education needed. Pt plans to DC to friend, June's, home and reports her friend can provide 24/7 assist. Anticipate good progress acutely to return home w/ HHOT at DC. Will continue to monitor and update DC recs as appropriate.       If plan is discharge home, recommend the following: A lot of help with walking and/or transfers;A lot of help with bathing/dressing/bathroom    Functional Status Assessment  Patient has had a recent decline in their functional status and demonstrates the ability to make significant improvements in function in a reasonable and predictable amount of time.  Equipment Recommendations  Other (comment);BSC/3in1 (RW)    Recommendations for Other Services       Precautions / Restrictions Precautions Precautions: Fall;Sternal Precaution Booklet Issued: Yes (comment) Restrictions Weight Bearing Restrictions Per Provider Order: No      Mobility Bed Mobility Overal bed mobility: Needs Assistance Bed Mobility: Supine to Sit     Supine to sit: Mod assist, +2 for physical assistance, +2 for safety/equipment, HOB elevated     General bed mobility comments: cues to hold heart pillow. able to initially assist  LE to EOB partially but required assist to bring fully off of bed due to reported pain. Assist for lifting trunk    Transfers Overall transfer level: Needs assistance Equipment used: Rolling walker (2 wheels) Transfers: Sit to/from Stand, Bed to chair/wheelchair/BSC Sit to Stand: Min assist, +2 physical assistance, +2 safety/equipment     Step pivot transfers: Min assist, +2 physical assistance, +2 safety/equipment     General transfer comment: Min A x 2 with cues to hold to heart pillow and assist to rise due to short stature in comparison to elevated ICU bed. Once up, able to step to recliner with Min A x 2 with line safety, assist with RW and cues for stepping to recliner appropriately before sitting down      Balance Overall balance assessment: Needs assistance Sitting-balance support: No upper extremity supported, Feet supported Sitting balance-Leahy Scale: Fair     Standing balance support: Bilateral upper extremity supported, During functional activity Standing balance-Leahy Scale: Poor                             ADL either performed or assessed with clinical judgement   ADL Overall ADL's : Needs assistance/impaired Eating/Feeding: Set up;Sitting   Grooming: Set up;Sitting   Upper Body Bathing: Minimal assistance;Sitting   Lower Body Bathing: Maximal assistance;Sit to/from stand;Sitting/lateral leans   Upper Body Dressing : Minimal assistance;Sitting   Lower Body Dressing: Maximal assistance;Sitting/lateral leans;Sit to/from stand   Toilet Transfer: Minimal assistance;+2 for safety/equipment;+2 for physical assistance;Stand-pivot;Rolling walker (2 wheels)  Toileting- Clothing Manipulation and Hygiene: Maximal assistance;Sitting/lateral lean;Sit to/from stand               Vision Ability to See in Adequate Light: 0 Adequate Patient Visual Report: No change from baseline Vision Assessment?: No apparent visual deficits     Perception          Praxis         Pertinent Vitals/Pain Pain Assessment Pain Assessment: Faces Faces Pain Scale: Hurts little more Pain Location: sternal incision Pain Descriptors / Indicators: Sore Pain Intervention(s): Monitored during session, Premedicated before session     Extremity/Trunk Assessment Upper Extremity Assessment Upper Extremity Assessment: Generalized weakness;Right hand dominant   Lower Extremity Assessment Lower Extremity Assessment: Defer to PT evaluation   Cervical / Trunk Assessment Cervical / Trunk Assessment: Normal   Communication Communication Communication: No apparent difficulties   Cognition Arousal: Alert, Lethargic Behavior During Therapy: WFL for tasks assessed/performed, Flat affect Overall Cognitive Status: Impaired/Different from baseline Area of Impairment: Memory, Attention, Following commands, Safety/judgement, Awareness, Problem solving                   Current Attention Level: Sustained Memory: Decreased recall of precautions, Decreased short-term memory Following Commands: Follows one step commands with increased time Safety/Judgement: Decreased awareness of deficits, Decreased awareness of safety Awareness: Emergent Problem Solving: Slow processing, Decreased initiation, Difficulty sequencing, Requires verbal cues, Requires tactile cues General Comments: pt with slower processing, minor confusion, often talking to June who was not present during session. cues for sternal precautions and seqeuncing with fair follow through overall. confusion likely due to pain meds     General Comments  VSS on  O2    Exercises     Shoulder Instructions      Home Living Family/patient expects to be discharged to:: Private residence Living Arrangements: Alone Available Help at Discharge: Friend(s);Available 24 hours/day Type of Home: Other(Comment) (condo) Home Access: Level entry     Home Layout: One level     Bathroom Shower/Tub: Multimedia programmer: Standard     Home Equipment: None   Additional Comments: planning to discharge to friend, June's condo (detailed above). June can provide 24/7 assist      Prior Functioning/Environment Prior Level of Function : Independent/Modified Independent;Driving             Mobility Comments: no AD ADLs Comments: Indep with ADLs, IADLs        OT Problem List: Decreased strength;Decreased activity tolerance;Impaired balance (sitting and/or standing);Decreased cognition;Decreased knowledge of use of DME or AE;Cardiopulmonary status limiting activity;Pain      OT Treatment/Interventions: Self-care/ADL training;Therapeutic exercise;Energy conservation;DME and/or AE instruction;Therapeutic activities;Balance training;Patient/family education    OT Goals(Current goals can be found in the care plan section) Acute Rehab OT Goals Patient Stated Goal: pain meds, have a ginger ale OT Goal Formulation: With patient Time For Goal Achievement: 10/24/23 Potential to Achieve Goals: Good ADL Goals Pt Will Perform Lower Body Bathing: with set-up;sit to/from stand;sitting/lateral leans Pt Will Perform Lower Body Dressing: with set-up;sitting/lateral leans;sit to/from stand Pt Will Transfer to Toilet: with contact guard assist;ambulating  OT Frequency: Min 1X/week    Co-evaluation PT/OT/SLP Co-Evaluation/Treatment: Yes Reason for Co-Treatment: For patient/therapist safety;To address functional/ADL transfers   OT goals addressed during session: ADL's and self-care;Proper use of Adaptive equipment and DME      AM-PAC OT "6 Clicks" Daily Activity     Outcome Measure Help from another person eating meals?: A Little Help from  another person taking care of personal grooming?: A Little Help from another person toileting, which includes using toliet, bedpan, or urinal?: A Lot Help from another person bathing (including washing, rinsing, drying)?: A Lot Help from another person  to put on and taking off regular upper body clothing?: A Little Help from another person to put on and taking off regular lower body clothing?: A Lot 6 Click Score: 15   End of Session Equipment Utilized During Treatment: Gait belt;Rolling walker (2 wheels);Oxygen Nurse Communication: Mobility status  Activity Tolerance: Patient tolerated treatment well Patient left: in chair;with call bell/phone within reach  OT Visit Diagnosis: Unsteadiness on feet (R26.81);Other abnormalities of gait and mobility (R26.89);Muscle weakness (generalized) (M62.81)                Time: 1610-9604 OT Time Calculation (min): 32 min Charges:  OT General Charges $OT Visit: 1 Visit OT Evaluation $OT Eval Moderate Complexity: 1 Mod  Bradd Canary, OTR/L Acute Rehab Services Office: [phone]   Lorre Munroe 10/10/2023, 12:24 PM

## 2023-10-10 NOTE — Progress Notes (Signed)
NAME:  Megan Pham, MRN:  [id number], DOB:  1946-08-29, LOS: 4 ADMISSION DATE:  10/06/2023, CONSULTATION DATE:  1/27 REFERRING MD:  Cliffton Asters, CHIEF COMPLAINT:  post-CABG   History of Present Illness:  78 year old female with PMH as below, which is significant for carotid stenosis, HTN, HLD, and CAD. Physical activity has become limited by dyspnea over a period of a few months. She became unable to walk to and from her mailbox without significant dyspnea. Also complained of exertional chest tightness intermittently. She underwent coronary CT screening for CAD with her cardiologist with concerning findings. She was referred for LHC demonstrated triple vessel disease including CTO RCA; no LM disease. Echo demonstrated preserved ejection fraction. She was referred to thoracic surgery and was felt to have good distal targets for bypass grafting. She presented to [facility] for CABG 1/27. Surgery was tolerated well with no immediate complications and the patient was transferred to the CVICU postoperatively for ongoing care. PCCM consulted for medical and ventilator management.   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   Significant Hospital Events: Including procedures, antibiotic start and stop dates in addition to other pertinent events   1/27 CABG x 4. LIMA LAD, RSVG PDA, OM1, OM2  1/28 less than 400 UOP  1/29 lasix 1/30 chest tube re[placed 1/31 afib RVR   Interim History / Subjective:   870 urine out  At some point chest tube was off suction Reportedly went into transient afib overngiht   Back in fib this morning,  with RVR    Feels like she got run over by a tractor trailer   Objective   Blood pressure (!) 133/57, pulse 74, temperature 99.2 F (37.3 C), temperature source Axillary, resp. rate 15, height 5' (1.524 m), weight 67.1 kg, SpO2 96%.        Intake/Output Summary (Last 24 hours) at 10/10/2023 1145 Last data filed at 10/10/2023 0400 Gross per 24 hour  Intake 0 ml  Output 310 ml  Net -310 ml   Filed Weights   10/07/23 0547 10/08/23 0500 10/09/23 0500  Weight: 81 kg 67.4 kg 67.1 kg    Examination: General:  Obese elderly F NAD  HENT: NCAT pink mm  Lungs: Symmetrical chest expansion. L chest tube in place  Cardiovascular. Irir cap refill brisk  Abdomen: soft obese  Extremities: no acute joint deformity  Neuro: AAOx3 Psych: flat affect  GU: defer    Resolved Hospital Problem list    Vasoplegia   Assessment & Plan:   S/p CABG x4 3vCAD  NSTEMI Hx HTN New Afib RVR  P -L/T/D per CVTS -- see chest tube plans below  -lasix 1/30  -metop 12.5 BID  -crestor  -lasix 40 PO  -adding amio 1/31  -multimodal analgesia   L ptx -replaced 1/30  P -follow CXR -mgmnt per cvts   AKI, improving  P -follow renal indices uop  -cont lasix   Possible undx COPD  Hx tobacco use  -cont nebs -pulm hygiene   Expected ABLA, thrombocytopenia  -- stable  -PRN CBC   Hyperglycemia  -SSI    Best Practice (right click and "Reselect all SmartList Selections" daily)   Diet/type: Reg  DVT prophylaxis LMWH Pressure  ulcer(s): N/A GI prophylaxis: PPI Lines: CVC   Foley:  yest  Code Status:  full code Last date of multidisciplinary goals of care discussion [ per primary ]  Labs   CBC: Recent Labs  Lab 10/07/23 0309 10/07/23 1629 10/08/23 0436 10/09/23 0217 10/10/23 0222  WBC 10.5 12.3* 12.4* 13.3* 11.5*  HGB 9.3* 9.6* 9.3* 9.4* 9.8*  HCT 28.3* 29.4* 29.4* 29.5* 30.4*  MCV 89.8 91.9 93.3 93.1 92.1  PLT 142* 139* 137* 155 223    Basic Metabolic Panel: Recent Labs   Lab 10/06/23 2000 10/07/23 0309 10/07/23 1629 10/08/23 0436 10/09/23 0217 10/10/23 0222  NA 141 139 139 137 135 140  K 4.0 3.8 4.7 5.1 4.9 4.0  CL 109 108 105 106 103 105  CO2 23 23 24 25 24 28   GLUCOSE 159* 120* 143* 126* 117* 125*  BUN 6* 7* 12 21 35* 32*  CREATININE 0.74 0.55 0.89 1.18* 1.18* 1.05*  CALCIUM 7.9* 7.9* 8.4* 8.4* 8.4* 8.4*  MG 3.3* 2.8* 2.6*  --  2.7* 2.6*   GFR: Estimated Creatinine Clearance: 38.3 mL/min (A) (by C-G formula based on SCr of 1.05 mg/dL (H)). Recent Labs  Lab 10/07/23 1629 10/08/23 0436 10/09/23 0217 10/10/23 0222  WBC 12.3* 12.4* 13.3* 11.5*    Liver Function Tests: No results for input(s): "AST", "ALT", "ALKPHOS", "BILITOT", "PROT", "ALBUMIN" in the last 168 hours.    Moderate MDM   Tessie Fass MSN, AGACNP-BC [facility] Pulmonary/Critical Care Medicine Amion for pager 10/10/2023, 11:45 AM

## 2023-10-10 NOTE — Progress Notes (Addendum)
      [address]       [place] [address]             [phone]        4 Days Post-Op Procedure(s) (LRB): CORONARY ARTERY BYPASS GRAFTING (CABG) TIMES FOUR USING LEFT INTERNAL MAMMARY ARTERY AND BILATERALLY ENDOSCOPICALLY HARVESTED GREATER SAPHENOUS VEINS (N/A) TRANSESOPHAGEAL ECHOCARDIOGRAM (TEE) (N/A)  Subjective: Patient irritable this am. She had a bowel movement. She sounds nasally from subcutaneous emphysema  Objective: Vital signs in last 24 hours: Temp:  [97 F (36.1 C)-98.6 F (37 C)] 98.6 F (37 C) (01/30 2100) Pulse Rate:  [65-105] 91 (01/31 0600) Cardiac Rhythm: Normal sinus rhythm (01/30 2127) Resp:  [8-21] 14 (01/31 0600) BP: (87-166)/(38-79) 124/67 (01/31 0600) SpO2:  [87 %-99 %] 96 % (01/31 0600)  Pre op weight 77.2 kg Current Weight  10/09/23 67.1 kg      Intake/Output from previous day: 01/30 0701 - 01/31 0700 In: 0  Out: 1180 [Urine:870; Chest Tube:310]   Physical Exam:  Cardiovascular: RRR Pulmonary: Wheezing this am. Extensive subcutaneous emphysema left chest wall/neck.  Abdomen: Soft, non tender, occasional bowel sounds present. Extremities: SCDs in place Wounds: RLE and LLE wounds are clean and dry.  No erythema or signs of infection. Prevena intact on sternal wound. Mild bruising around [facility] wounds Left chest tube: Suction not on this am so turned on. Small air leak  Lab Results: CBC: Recent Labs    10/09/23 0217 10/10/23 0222  WBC 13.3* 11.5*  HGB 9.4* 9.8*  HCT 29.5* 30.4*  PLT 155 223   BMET:  Recent Labs    10/09/23 0217 10/10/23 0222  NA 135 140  K 4.9 4.0  CL 103 105  CO2 24 28  GLUCOSE 117* 125*  BUN 35* 32*  CREATININE 1.18* 1.05*  CALCIUM 8.4* 8.4*    PT/INR:  Lab Results  Component Value Date   INR 1.6 (H) 10/06/2023   INR 1.0 10/02/2023   ABG:  INR: Will add last result for INR, ABG once components are confirmed Will add last 4 CBG results once components are  confirmed  Assessment/Plan:  1. CV - SR. On Lopressor 12.5 mg bid. 2.  Pulmonary - On 6L HFNC this am. She may have undiagnosed COPD. Continue nebs as ordered per pulmonary. Left chest tube placed yesterday for left pneumothorax. Chest tube to suction now, small air leak. CXR this am shows ? Trace left apical pneumothorax, left lateral chest walls/neck subcutaneous emphysema,bibasilar atelectasis. Mucinex bid. Encourage incentive spirometer and flutter valve. 3. Above pre op weight, requires further diuresis-on Lasix 40 mg orally 4.  Expected post op acute blood loss anemia - H and H this am stable at 9.8 and 30.4 5. CBGs 116/125/103. Pre op HGA1C 6. She has pre diabetes. Will stop accu checks and SS PRN after transfer to the floor. Will provide nutrition information with discharge paperwork.   6. GI-continue scheduled Reglan for nausea, continue Zofran PRN.  7. On Lovenox for DVT prophylaxis 8. Deconditioned PT/OT 9. Likely transfer  Donielle M ZimmermanPA-C 7:08 AM  Agree with above Short run of afib, starting amio Floor  Sherrel Shafer O Iyana Topor

## 2023-10-10 NOTE — Evaluation (Signed)
Physical Therapy Evaluation Patient Details Name: Megan Pham MRN: [id number] DOB: Feb 09, 1946 Today's Date: 10/10/2023  History of Present Illness  Pt is a 78 y/o female admitted for CABG x 4 1/27 in setting of CAD. Found to have L pneumothorax and required chest tube placement. PMH: CAD, HTN, OA, HLD  Clinical Impression  Pt is presenting below baseline level of functioning. Currently pt is Mod A +2 for bed mobility and Min A +2 for sit to stand from elevated bed and for transfer with RW. Pt requires frequent reminders for sternal precautions. Pt states she has good support from friends at home. Due to pt current functional status, home set up and available assistance at home recommending skilled physical therapy services 3x/week in order to address strength, balance and functional mobility to decrease risk for falls, injury and re-hospitalization.            If plan is discharge home, recommend the following: A little help with walking and/or transfers;Assistance with cooking/housework;Assist for transportation     Equipment Recommendations Rolling walker (2 wheels);BSC/3in1     Functional Status Assessment Patient has had a recent decline in their functional status and demonstrates the ability to make significant improvements in function in a reasonable and predictable amount of time.     Precautions / Restrictions Precautions Precautions: Fall;Sternal Precaution Booklet Issued: No Precaution Comments: OT provided book, went over precautions throughout session. Restrictions Weight Bearing Restrictions Per Provider Order: No (sternal precautions)      Mobility  Bed Mobility Overal bed mobility: Needs Assistance Bed Mobility: Supine to Sit     Supine to sit: Mod assist, +2 for physical assistance, +2 for safety/equipment, HOB elevated     General bed mobility comments: cues to hold heart pillow. able to initially assist LE to EOB partially but required assist to bring fully  off of bed due to reported pain. Assist for lifting trunk    Transfers Overall transfer level: Needs assistance Equipment used: Rolling walker (2 wheels) Transfers: Sit to/from Stand, Bed to chair/wheelchair/BSC Sit to Stand: Min assist, +2 physical assistance, +2 safety/equipment   Step pivot transfers: Min assist, +2 physical assistance, +2 safety/equipment       General transfer comment: Min A x 2 with cues to hold to heart pillow and assist to rise due to short stature in comparison to elevated ICU bed. Once up, able to step to recliner with Min A x 2 with line safety, assist with RW and cues for stepping to recliner appropriately before sitting down    Ambulation/Gait   Pre-gait activities: Pt took steps from EOB to recliner with short step height and multiple steps for turning from EOB to recliner. Pt require Min A +2 for balance with light UE support on RW.        Balance Overall balance assessment: Needs assistance Sitting-balance support: No upper extremity supported, Feet supported Sitting balance-Leahy Scale: Fair     Standing balance support: Bilateral upper extremity supported, During functional activity Standing balance-Leahy Scale: Poor           Pertinent Vitals/Pain Pain Assessment Pain Assessment: Faces Faces Pain Scale: Hurts little more Facial Expression: Relaxed, neutral Body Movements: Absence of movements Muscle Tension: Relaxed Compliance with ventilator (intubated pts.): N/A Vocalization (extubated pts.): Talking in normal tone or no sound CPOT Total: 0 Pain Location: sternal incision Pain Descriptors / Indicators: Sore Pain Intervention(s): Limited activity within patient's tolerance, Monitored during session, Premedicated before session    Home Living  Family/patient expects to be discharged to:: Private residence Living Arrangements: Alone Available Help at Discharge: Friend(s);Available 24 hours/day Type of Home: Other(Comment)  (condo) Home Access: Level entry       Home Layout: One level Home Equipment: None Additional Comments: planning to discharge to friend, June's condo (detailed above). June can provide 24/7 assist    Prior Function Prior Level of Function : Independent/Modified Independent;Driving             Mobility Comments: no AD ADLs Comments: Indep with ADLs, IADLs     Extremity/Trunk Assessment   Upper Extremity Assessment Upper Extremity Assessment: Defer to OT evaluation    Lower Extremity Assessment Lower Extremity Assessment: Generalized weakness    Cervical / Trunk Assessment Cervical / Trunk Assessment: Normal  Communication   Communication Communication: No apparent difficulties Cueing Techniques: Verbal cues;Tactile cues  Cognition Arousal: Alert, Lethargic Behavior During Therapy: WFL for tasks assessed/performed, Flat affect Overall Cognitive Status: Impaired/Different from baseline Area of Impairment: Memory, Following commands, Safety/judgement, Awareness     Memory: Decreased recall of precautions, Decreased short-term memory Following Commands: Follows one step commands with increased time Safety/Judgement: Decreased awareness of deficits, Decreased awareness of safety   Problem Solving: Slow processing, Decreased initiation, Difficulty sequencing, Requires verbal cues, Requires tactile cues General Comments: pt with slower processing, minor confusion, often talking to June who was not present during session. cues for sternal precautions and seqeuncing with fair follow through overall. confusion likely due to pain meds        General Comments General comments (skin integrity, edema, etc.): VS remained stable on O2 during functional mobility        Assessment/Plan    PT Assessment Patient needs continued PT services  PT Problem List Decreased strength;Decreased activity tolerance;Decreased balance;Decreased mobility       PT Treatment Interventions  DME instruction;Therapeutic activities;Balance training;Gait training;Functional mobility training;Therapeutic exercise;Patient/family education    PT Goals (Current goals can be found in the Care Plan section)  Acute Rehab PT Goals Patient Stated Goal: to go to Friend June's house. PT Goal Formulation: With patient Time For Goal Achievement: 10/24/23 Potential to Achieve Goals: Good    Frequency Min 1X/week     Co-evaluation PT/OT/SLP Co-Evaluation/Treatment: Yes Reason for Co-Treatment: For patient/therapist safety;To address functional/ADL transfers   OT goals addressed during session: ADL's and self-care;Proper use of Adaptive equipment and DME       AM-PAC PT "6 Clicks" Mobility  Outcome Measure Help needed turning from your back to your side while in a flat bed without using bedrails?: A Lot Help needed moving from lying on your back to sitting on the side of a flat bed without using bedrails?: A Lot Help needed moving to and from a bed to a chair (including a wheelchair)?: A Lot Help needed standing up from a chair using your arms (e.g., wheelchair or bedside chair)?: A Lot Help needed to walk in hospital room?: A Lot Help needed climbing 3-5 steps with a railing? : A Lot 6 Click Score: 12    End of Session Equipment Utilized During Treatment: Gait belt;Oxygen Activity Tolerance: Patient tolerated treatment well;Patient limited by fatigue Patient left: in chair;with call bell/phone within reach Nurse Communication: Mobility status;Other (comment) (no alarm under patient) PT Visit Diagnosis: Unsteadiness on feet (R26.81);Other abnormalities of gait and mobility (R26.89);Pain Pain - part of body:  (sternum)    Time: 4098-1191 PT Time Calculation (min) (ACUTE ONLY): 32 min   Charges:   PT Evaluation $PT Eval  Low Complexity: 1 Low   PT General Charges $$ ACUTE PT VISIT: 1 Visit        Harrel Carina, DPT, CLT  Acute Rehabilitation Services Office:  [phone] (Secure chat preferred)   Claudia Desanctis 10/10/2023, 2:30 PM

## 2023-10-10 NOTE — Progress Notes (Addendum)
RN entered patients room for midnight assessment, CBG, and temperature. Pt. Declined CBG and temperature. Pt. Is aware of where she is, but is unaware of why she is here. Progressively becoming more confused as the shift goes on. When ask, patient stated she would like something to help her sleep. Will reach out to surgical MD on call for new orders.   PRN Benadryl present in [facility], retrieved from [facility], per patient request, once I attempted to administer the medication patient declined.

## 2023-10-10 NOTE — Plan of Care (Signed)
  Problem: Education: Goal: Knowledge of General Education information will improve Description: Including pain rating scale, medication(s)/side effects and non-pharmacologic comfort measures Outcome: Not Progressing   Problem: Health Behavior/Discharge Planning: Goal: Ability to manage health-related needs will improve Outcome: Not Progressing   Problem: Clinical Measurements: Goal: Ability to maintain clinical measurements within normal limits will improve Outcome: Progressing Goal: Will remain free from infection Outcome: Progressing Goal: Diagnostic test results will improve Outcome: Progressing Goal: Respiratory complications will improve Outcome: Not Progressing   Problem: Nutrition: Goal: Adequate nutrition will be maintained Outcome: Progressing   Problem: Coping: Goal: Level of anxiety will decrease Outcome: Not Progressing   Problem: Cardiac: Goal: Will achieve and/or maintain hemodynamic stability Outcome: Progressing   Problem: Respiratory: Goal: Respiratory status will improve Outcome: Not Progressing   Problem: Skin Integrity: Goal: Wound healing without signs and symptoms of infection Outcome: Not Progressing   Problem: Urinary Elimination: Goal: Ability to achieve and maintain adequate renal perfusion and functioning will improve Outcome: Progressing   Problem: Fluid Volume: Goal: Ability to maintain a balanced intake and output will improve Outcome: Not Progressing   Problem: Skin Integrity: Goal: Risk for impaired skin integrity will decrease Outcome: Not Progressing

## 2023-10-11 DIAGNOSIS — N179 Acute kidney failure, unspecified: Secondary | ICD-10-CM | POA: Diagnosis not present

## 2023-10-11 DIAGNOSIS — I480 Paroxysmal atrial fibrillation: Secondary | ICD-10-CM | POA: Diagnosis not present

## 2023-10-11 DIAGNOSIS — I25799 Atherosclerosis of other coronary artery bypass graft(s) with unspecified angina pectoris: Secondary | ICD-10-CM | POA: Diagnosis not present

## 2023-10-11 DIAGNOSIS — J939 Pneumothorax, unspecified: Secondary | ICD-10-CM | POA: Diagnosis not present

## 2023-10-11 LAB — BASIC METABOLIC PANEL
Anion gap: 10 (ref 5–15)
BUN: 24 mg/dL — ABNORMAL HIGH (ref 8–23)
CO2: 28 mmol/L (ref 22–32)
Calcium: 8.3 mg/dL — ABNORMAL LOW (ref 8.9–10.3)
Chloride: 101 mmol/L (ref 98–111)
Creatinine, Ser: 0.8 mg/dL (ref 0.44–1.00)
GFR, Estimated: >60 mL/min (ref >60)
Glucose, Bld: 133 mg/dL — ABNORMAL HIGH (ref 70–99)
Potassium: 3.3 mmol/L — ABNORMAL LOW (ref 3.5–5.1)
Sodium: 139 mmol/L (ref 135–145)

## 2023-10-11 LAB — CBC
HCT: 26.7 % — ABNORMAL LOW (ref 36.0–46.0)
Hemoglobin: 8.8 g/dL — ABNORMAL LOW (ref 12.0–15.0)
MCH: 29.3 pg (ref 26.0–34.0)
MCHC: 33 g/dL (ref 30.0–36.0)
MCV: 89 fL (ref 80.0–100.0)
Platelets: 235 10*3/uL (ref 150–400)
RBC: 3 MIL/uL — ABNORMAL LOW (ref 3.87–5.11)
RDW: 12.5 % (ref 11.5–15.5)
WBC: 8.6 10*3/uL (ref 4.0–10.5)
nRBC: 0 % (ref 0.0–0.2)

## 2023-10-11 LAB — MAGNESIUM: Magnesium: 2.1 mg/dL (ref 1.7–2.4)

## 2023-10-11 LAB — GLUCOSE, CAPILLARY
Glucose-Capillary: 108 mg/dL — ABNORMAL HIGH (ref 70–99)
Glucose-Capillary: 93 mg/dL (ref 70–99)
Glucose-Capillary: 141 mg/dL — ABNORMAL HIGH (ref 70–99)
Glucose-Capillary: 159 mg/dL — ABNORMAL HIGH (ref 70–99)
Glucose-Capillary: 132 mg/dL — ABNORMAL HIGH (ref 70–99)

## 2023-10-11 MED ORDER — QUETIAPINE FUMARATE 25 MG PO TABS
12.5000 mg | ORAL_TABLET | Freq: Every day | ORAL | Status: DC
  Administered 2023-10-11 – 2023-10-17 (×7): 12.5 mg via ORAL
  Filled 2023-10-11 (×7): qty 1

## 2023-10-11 MED ORDER — POTASSIUM CHLORIDE CRYS ER 20 MEQ PO TBCR
40.0000 meq | EXTENDED_RELEASE_TABLET | ORAL | Status: AC
Start: 2023-10-11 — End: 2023-10-11
  Administered 2023-10-11 (×2): 40 meq via ORAL
  Filled 2023-10-11 (×2): qty 2

## 2023-10-11 MED ORDER — METOPROLOL SUCCINATE ER 25 MG PO TB24
25.0000 mg | ORAL_TABLET | Freq: Two times a day (BID) | ORAL | Status: DC
Start: 2023-10-11 — End: 2023-10-18
  Administered 2023-10-11 – 2023-10-18 (×15): 25 mg via ORAL
  Filled 2023-10-11 (×15): qty 1

## 2023-10-11 MED ORDER — QUETIAPINE FUMARATE 25 MG PO TABS
25.0000 mg | ORAL_TABLET | Freq: Every day | ORAL | Status: DC
Start: 2023-10-11 — End: 2023-10-11

## 2023-10-11 MED ORDER — POTASSIUM CHLORIDE CRYS ER 20 MEQ PO TBCR
20.0000 meq | EXTENDED_RELEASE_TABLET | Freq: Every day | ORAL | Status: DC
  Administered 2023-10-12: 20 meq via ORAL
  Filled 2023-10-11: qty 1

## 2023-10-11 MED ORDER — FUROSEMIDE 10 MG/ML IJ SOLN
40.0000 mg | Freq: Once | INTRAMUSCULAR | Status: AC
  Administered 2023-10-11: 40 mg via INTRAVENOUS
  Filled 2023-10-11: qty 4

## 2023-10-11 NOTE — Evaluation (Signed)
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   Dyslipidemia    Dyspnea    Family history of breast cancer 05/27/2022   Family history of gastric cancer 05/27/2022   HTN (hypertension)    Hyperlipidemia    Osteoarthritis    Osteopenia    Poison ivy    Pseudoptosis, unspecified laterality    Ulcer 1979   ulcerative colitis   Past Surgical History:  Past Surgical History:  Procedure Laterality Date   ABDOMINAL HYSTERECTOMY  1973   Partial hysterectomy   APPENDECTOMY  1973   CHOLECYSTECTOMY  2000   COLONOSCOPY  2004   by Dr.James Weissman-had left sided diverticulosis   CORONARY ARTERY BYPASS GRAFT N/A 10/06/2023   Procedure: CORONARY ARTERY BYPASS GRAFTING (CABG) TIMES FOUR USING LEFT INTERNAL MAMMARY ARTERY AND BILATERALLY ENDOSCOPICALLY HARVESTED GREATER SAPHENOUS VEINS;  Surgeon: Corliss Skains, MD;  Location: [facility] OR;  Service: Open Heart Surgery;  Laterality: N/A;   HAMMER TOE SURGERY  2004   HERNIA REPAIR     LEFT HEART CATH AND CORONARY ANGIOGRAPHY N/A 08/26/2023   Procedure: LEFT HEART CATH AND CORONARY ANGIOGRAPHY;  Surgeon: Tonny Bollman, MD;  Location: [facility] INVASIVE CV LAB;  Service: Cardiovascular;  Laterality: N/A;   TEE WITHOUT CARDIOVERSION N/A 10/06/2023   Procedure: TRANSESOPHAGEAL ECHOCARDIOGRAM (TEE);  Surgeon: Corliss Skains, MD;  Location: [facility] OR;  Service: Open Heart Surgery;  Laterality: N/A;   TONSILLECTOMY  1970   HPI:  Pt is a 78 y.o. female admitted for CABG x 4 1/27 in setting of CAD. Found to have L pneumothorax and required chest  tube placement. RN reports pt with increased confusion and difficulty swallowing overnight. Pt reported to be holding pills, requiring RN to manually remove them. Pt subsequently stating "I can't swallow". BSE ordered. PMH: carotid stenosis, HTN, HLD,CAD, Dyslipidemia, Dyspnea.    Assessment / Plan / Recommendation  Clinical Impression  RN reports that since incident this morning, in which pt had difficulty swallowing pills, pt has improved. Upon SLP arrival, pt eating regular textured cookies and sipping on Ensure without overt difficulties. RN suspects am incidence may have been related to drowsiness/ICU delirium. Prior to the incident, pt had been eating/drinking and taking pills without s/sx of aspiration per RN. Pt required intermittent verbal cues during evaluation in order to follow commands, but was alert and verbally responding to all questions appropriately. Oral mechanism examination WFL. Self-fed successive sips of thin liquids and bites of puree/regular textures were without clinical signs of aspiration across repeated attempts. Pt reported no complaints, difficulty or pain with swallowing. Suspect single episode this am was related to mentation/alertness given RN report. Continue on regular diet/thin liquids with universal swallow precautions and intermittent supervision. Will s/o for now, but please reconsult if there is a repeated incidence of dysphagia.  SLP Visit Diagnosis: Dysphagia, unspecified (R13.10)    Aspiration Risk  Mild aspiration risk    Diet Recommendation Regular;Thin liquid    Liquid Administration via: Cup;Straw Medication Administration: Whole meds with liquid (or whole in puree as tolerated) Supervision: Intermittent  supervision to cue for compensatory strategies Compensations: Minimize environmental distractions;Slow rate;Small sips/bites;Follow solids with liquid Postural Changes: Seated upright at 90 degrees    Other  Recommendations Oral Care Recommendations:  Oral care BID    Recommendations for follow up therapy are one component of a multi-disciplinary discharge planning process, led by the attending physician.  Recommendations may be updated based on patient status, additional functional criteria and insurance authorization.  Follow up Recommendations No SLP follow up      Assistance Recommended at Discharge    Functional Status Assessment Patient has not had a recent decline in their functional status  Frequency and Duration            Prognosis        Swallow Study   General Date of Onset: 10/11/23 HPI: Pt is a 78 y.o. female admitted for CABG x 4 1/27 in setting of CAD. Found to have L pneumothorax and required chest tube placement. RN reports pt with increased confusion and difficulty swallowing overnight. Pt reported to be holding pills, requiring RN to manually remove them. Pt subsequently stating "I can't swallow". BSE ordered. PMH: carotid stenosis, HTN, HLD,CAD, Dyslipidemia, Dyspnea. Type of Study: Bedside Swallow Evaluation Previous Swallow Assessment: none per EMR Diet Prior to this Study: Regular;Thin liquids (Level 0) Temperature Spikes Noted: No Respiratory Status: Nasal cannula History of Recent Intubation: No Behavior/Cognition: Alert;Cooperative;Pleasant mood;Confused Oral Cavity Assessment: Within Functional Limits Oral Care Completed by SLP: No Oral Cavity - Dentition: Adequate natural dentition Vision: Functional for self-feeding Self-Feeding Abilities: Able to feed self Patient Positioning: Upright in chair;Postural control adequate for testing Baseline Vocal Quality: Normal Volitional Cough:  (throat clear) Volitional Swallow: Unable to elicit    Oral/Motor/Sensory Function Overall Oral Motor/Sensory Function: Within functional limits   Ice Chips Ice chips: Not tested   Thin Liquid Thin Liquid: Within functional limits Presentation: Self Fed;Straw    Nectar Thick Nectar Thick Liquid: Not tested    Honey Thick Honey Thick Liquid: Not tested   Puree Puree: Within functional limits Presentation: Self Fed;Spoon   Solid     Solid: Within functional limits Presentation: Self Fed       Avie Echevaria, MA, CCC-SLP Acute Rehabilitation Services Office Number: [phone]  Paulette Blanch 10/11/2023,12:33 PM

## 2023-10-11 NOTE — Progress Notes (Signed)
NAME:  Megan Pham, MRN:  [id number], DOB:  [birth year], LOS: 5 ADMISSION DATE:  10/06/2023, CONSULTATION DATE:  1/27 REFERRING MD:  Cliffton Asters, CHIEF COMPLAINT:  post-CABG   History of Present Illness:  78 year old female with PMH as below, which is significant for carotid stenosis, HTN, HLD, and CAD. Physical activity has become limited by dyspnea over a period of a few months. She became unable to walk to and from her mailbox without significant dyspnea. Also complained of exertional chest tightness intermittently. She underwent coronary CT screening for CAD with her cardiologist with concerning findings. She was referred for LHC demonstrated triple vessel disease including CTO RCA; no LM disease. Echo demonstrated preserved ejection fraction. She was referred to thoracic surgery and was felt to have good distal targets for bypass grafting. She presented to [facility] for CABG 1/27. Surgery was tolerated well with no immediate complications and the patient was transferred to the CVICU postoperatively for ongoing care. PCCM consulted for medical and ventilator management.   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   Significant Hospital Events: Including procedures, antibiotic start and stop dates in addition to other pertinent events   1/27 CABG x 4. LIMA LAD, RSVG PDA, OM1, OM2  1/28 less than 400 UOP  1/29 lasix 1/30 chest tube re[placed 1/31 afib RVR   Interim History / Subjective:  Patient went into A-fib with RVR, required amiodarone bolus and infusion This morning amiodarone was switched to p.o. Goes in and out  of A-fib Chest tube was put on suction due to slight increase in pneumothorax Remained afebrile She is confused to this morning  Objective   Blood pressure (!) 145/57, pulse 86, temperature 98.2 F (36.8 C), temperature source Oral, resp. rate 15, height 5' (1.524 m), weight 67.1 kg, SpO2 98%.        Intake/Output Summary (Last 24 hours) at 10/11/2023 1032 Last data filed at 10/11/2023 0700 Gross per 24 hour  Intake 935.1 ml  Output 2310 ml  Net -1374.9 ml   Filed Weights   10/07/23 0547 10/08/23 0500 10/09/23 0500  Weight: 81 kg 67.4 kg 67.1 kg    Examination: General: Elderly female, lying on the bed HEENT: [place]/AT, eyes anicteric.  moist mucus membranes Neuro: Awake, following commands, moving all 4 extremities.  Intermittently gets confused Chest: Central sternotomy incision looks clean and dry, reduced air entry at the bases bilaterally, no wheezes or rhonchi.  Left-sided pigtail chest tube in place Heart: Irregularly irregular, no murmurs or gallops Abdomen: Soft, nontender, nondistended, bowel sounds present Skin: No rash   Chest tube output 230 cc  Labs and images reviewed Serum creatinine trended down to 0.8  Resolved Hospital Problem list   Vasoplegia  AKI Thrombocytopenia Assessment & Plan:  Coronary artery disease status post CABG x 4 Paroxysmal A-fib Left-sided pneumothorax AKI due to cardiorenal syndrome, resolved Probable COPD Hypokalemia Expected acute blood loss anemia  Continue aspirin and statin TCTS following Encourage incentive spirometry and ambulation PT/OT evaluation Went into A-fib with RVR last night, requiring amiodarone bolus and amiodarone infusion This morning converted to sinus rhythm but going in and out of A-fib Amiodarone switched to p.o. Continue Toprol 25 mg daily Continue chest tube on  suction as yesterday x-ray chest showed slight increase in pneumothorax Serum creatinine came down to baseline Continue nebs Continue  aggressive electrolyte replacement H&H remained stable between 8-9  Best Practice (right click and "Reselect all SmartList Selections" daily)   Diet/type: Reg, encourage oral intake DVT prophylaxis LMWH Pressure ulcer(s): N/A GI prophylaxis: PPI Lines: CVC   Foley:  yes Code Status:  full code Last date of multidisciplinary goals of care discussion [ per primary ]  Labs   CBC: Recent Labs  Lab 10/07/23 1629 10/08/23 0436 10/09/23 0217 10/10/23 0222 10/11/23 0326  WBC 12.3* 12.4* 13.3* 11.5* 8.6  HGB 9.6* 9.3* 9.4* 9.8* 8.8*  HCT 29.4* 29.4* 29.5* 30.4* 26.7*  MCV 91.9 93.3 93.1 92.1 89.0  PLT 139* 137* 155 223 235    Basic Metabolic Panel: Recent Labs  Lab 10/07/23 0309 10/07/23 1629 10/08/23 0436 10/09/23 0217 10/10/23 0222 10/11/23 0326  NA 139 139 137 135 140 139  K 3.8 4.7 5.1 4.9 4.0 3.3*  CL 108 105 106 103 105 101  CO2 23 24 25 24 28 28   GLUCOSE 120* 143* 126* 117* 125* 133*  BUN 7* 12 21 35* 32* 24*  CREATININE 0.55 0.89 1.18* 1.18* 1.05* 0.80  CALCIUM 7.9* 8.4* 8.4* 8.4* 8.4* 8.3*  MG 2.8* 2.6*  --  2.7* 2.6* 2.1   GFR: Estimated Creatinine Clearance: 50.3 mL/min (by C-G formula based on SCr of 0.8 mg/dL). Recent Labs  Lab 10/08/23 0436 10/09/23 0217 10/10/23 0222 10/11/23 0326  WBC 12.4* 13.3* 11.5* 8.6    Liver Function Tests: No results for input(s): "AST", "ALT", "ALKPHOS", "BILITOT", "PROT", "ALBUMIN" in the last 168 hours.     Cheri Fowler, MD [facility] See Amion for pager If no response to pager, please call [phone] until 7pm After 7pm, Please call E-link [phone]

## 2023-10-11 NOTE — Progress Notes (Signed)
      [address]       [place] [address]             [phone]      5 Days Post-Op  Procedure(s) (LRB): CORONARY ARTERY BYPASS GRAFTING (CABG) TIMES FOUR USING LEFT INTERNAL MAMMARY ARTERY AND BILATERALLY ENDOSCOPICALLY HARVESTED GREATER SAPHENOUS VEINS (N/A) TRANSESOPHAGEAL ECHOCARDIOGRAM (TEE) (N/A)   Total Length of Stay:  LOS: 5 days    SUBJECTIVE: Sleepy this am Slept all night Went back into afib now NSR on amiodarone drip  Vitals:   10/11/23 0730 10/11/23 0802  BP: (!) 120/47   Pulse: 79   Resp: 15   Temp:    SpO2: 98% 98%    Intake/Output      01/31 0701 02/01 0700 02/01 0701 02/02 0700   P.O. 840    I.V. (mL/kg) 335.1 (5)    Chest Tube     Total Intake(mL/kg) 1175.1 (17.5)    Urine (mL/kg/hr) 2400 (1.5)    Emesis/NG output 0    Stool 0    Chest Tube 230    Total Output 2630    Net -1454.9             amiodarone 30 mg/hr (10/11/23 0700)    CBC    Component Value Date/Time   WBC 8.6 10/11/2023 0326   RBC 3.00 (L) 10/11/2023 0326   HGB 8.8 (L) 10/11/2023 0326   HGB 13.1 08/21/2023 1004   HCT 26.7 (L) 10/11/2023 0326   HCT 40.0 08/21/2023 1004   PLT 235 10/11/2023 0326   PLT 309 08/21/2023 1004   MCV 89.0 10/11/2023 0326   MCV 89 08/21/2023 1004   MCH 29.3 10/11/2023 0326   MCHC 33.0 10/11/2023 0326   RDW 12.5 10/11/2023 0326   RDW 11.4 (L) 08/21/2023 1004   LYMPHSABS 3.0 12/15/2017 0845   MONOABS 0.4 12/15/2017 0845   EOSABS 0.1 12/15/2017 0845   BASOSABS 0.0 12/15/2017 0845   CMP     Component Value Date/Time   NA 139 10/11/2023 0326   NA 141 08/21/2023 1004   K 3.3 (L) 10/11/2023 0326   CL 101 10/11/2023 0326   CO2 28 10/11/2023 0326   GLUCOSE 133 (H) 10/11/2023 0326   BUN 24 (H) 10/11/2023 0326   BUN 12 08/21/2023 1004   CREATININE 0.80 10/11/2023 0326   CALCIUM 8.3 (L) 10/11/2023 0326   PROT 7.6 10/02/2023 1500   ALBUMIN 4.1 10/02/2023 1500   AST 25 10/02/2023 1500   ALT 24 10/02/2023 1500    ALKPHOS 102 10/02/2023 1500   BILITOT 0.5 10/02/2023 1500   GFRNONAA >60 10/11/2023 0326   ABG    Component Value Date/Time   PHART 7.359 10/06/2023 1859   PCO2ART 42.4 10/06/2023 1859   PO2ART 99 10/06/2023 1859   HCO3 23.9 10/06/2023 1859   TCO2 25 10/06/2023 1859   ACIDBASEDEF 2.0 10/06/2023 1859   O2SAT 97 10/06/2023 1859   CBG (last 3)  Recent Labs    10/10/23 1632 10/10/23 2033 10/11/23 0345  GLUCAP 143* 112* 108*  EXAM Lungs: decreased at bases Card: RR with frequent PACs Ext: warm Neuro: sleepy but oriented   ASSESSMENT: SP CABG Afib: converted to NSR; will go to po amiodarone and increase BB. Hold on noac for now Remove sternal dressing CXR better but still wet. No pneumo. Still with air leak. Keep pig tail in for now Cont diuresis   Eugenio Hoes, MD 10/11/2023

## 2023-10-11 NOTE — Progress Notes (Signed)
Physical Therapy Treatment Patient Details Name: Megan Pham MRN: [id number] DOB: [birth year] Today's Date: 10/11/2023   History of Present Illness 78 y/o female admitted 1/27 for CABG x 4. 1/30 Lt PTX with chest tube placed. PMH: CAD, HTN, OA, HLD    PT Comments  Pt with flat affect, HOH and decreased recall of precautions. Pt progressing with mobility slowly and anticipate pt will need increased time to fully recover to independence and would benefit from continued inpatient follow up therapy, <3 hours/day. Pt educated for HEP, transfers, gait and precautions and will continue to follow acutely.   HR 90-95 SpO2 92% on 1L at rest dropped to 88% on RA and required 3L with gait for 92%     If plan is discharge home, recommend the following: Assistance with cooking/housework;Assist for transportation;A lot of help with walking and/or transfers;A lot of help with bathing/dressing/bathroom   Can travel by private vehicle     No  Equipment Recommendations  Rolling walker (2 wheels);BSC/3in1    Recommendations for Other Services       Precautions / Restrictions Precautions Precautions: Fall;Sternal;Other (comment) Precaution Comments: chest tube     Mobility  Bed Mobility Overal bed mobility: Needs Assistance Bed Mobility: Rolling, Sidelying to Sit Rolling: Mod assist Sidelying to sit: Mod assist, HOB elevated       General bed mobility comments: HOB 25 degrees with mod assist to roll and rise from left with cues for sequence    Transfers Overall transfer level: Needs assistance   Transfers: Sit to/from Stand Sit to Stand: Min assist, +2 safety/equipment           General transfer comment: min assist to rise from bed and from recliner x 2 trials with cues for hand placement, sequence and safety    Ambulation/Gait Ambulation/Gait assistance: Min assist, +2 safety/equipment Gait Distance (Feet): 15 Feet Assistive device: Rolling walker (2 wheels) Gait  Pattern/deviations: Step-through pattern, Decreased stride length, Trunk flexed   Gait velocity interpretation: <1.31 ft/sec, indicative of household ambulator   General Gait Details: cue for looking up, posture, proximity to Rw, increased stride and distance. Close chair follow. Pt walked 3', 18', 15'   Stairs             Wheelchair Mobility     Tilt Bed    Modified Rankin (Stroke Patients Only)       Balance Overall balance assessment: Needs assistance Sitting-balance support: No upper extremity supported, Feet supported Sitting balance-Leahy Scale: Fair     Standing balance support: Bilateral upper extremity supported, During functional activity, Reliant on assistive device for balance Standing balance-Leahy Scale: Poor Standing balance comment: Rw for gait                            Cognition Arousal: Alert Behavior During Therapy: Flat affect Overall Cognitive Status: Impaired/Different from baseline Area of Impairment: Memory, Following commands                   Current Attention Level: Sustained Memory: Decreased recall of precautions Following Commands: Follows one step commands with increased time       General Comments: pt with flat affect and difficulty recalling precautions, also HOH without hearing aids present which contributes        Exercises General Exercises - Lower Extremity Long Arc Quad: AROM, Both, 15 reps, Seated Hip Flexion/Marching: AROM, Both, 10 reps, Seated    General Comments  Pertinent Vitals/Pain Pain Assessment Pain Score: 6  Pain Location: sternal incision Pain Descriptors / Indicators: Sore, Aching Pain Intervention(s): Limited activity within patient's tolerance, Monitored during session, Repositioned    Home Living                          Prior Function            PT Goals (current goals can now be found in the care plan section) Progress towards PT goals: Progressing  toward goals    Frequency    Min 1X/week      PT Plan      Co-evaluation              AM-PAC PT "6 Clicks" Mobility   Outcome Measure  Help needed turning from your back to your side while in a flat bed without using bedrails?: A Lot Help needed moving from lying on your back to sitting on the side of a flat bed without using bedrails?: A Lot Help needed moving to and from a bed to a chair (including a wheelchair)?: A Lot Help needed standing up from a chair using your arms (e.g., wheelchair or bedside chair)?: A Lot Help needed to walk in hospital room?: A Lot Help needed climbing 3-5 steps with a railing? : Total 6 Click Score: 11    End of Session Equipment Utilized During Treatment: Oxygen Activity Tolerance: Patient tolerated treatment well;Patient limited by fatigue Patient left: in chair;with call bell/phone within reach Nurse Communication: Mobility status;Precautions PT Visit Diagnosis: Unsteadiness on feet (R26.81);Other abnormalities of gait and mobility (R26.89);Pain     Time: [id number] PT Time Calculation (min) (ACUTE ONLY): 30 min  Charges:    $Gait Training: 8-22 mins $Therapeutic Activity: 8-22 mins PT General Charges $$ ACUTE PT VISIT: 1 Visit                     Merryl Hacker, PT Acute Rehabilitation Services Office: [phone]    Enedina Finner Briell Paulette 10/11/2023, 12:25 PM

## 2023-10-12 ENCOUNTER — Inpatient Hospital Stay (HOSPITAL_COMMUNITY): Payer: 59

## 2023-10-12 LAB — CBC
HCT: 27.2 % — ABNORMAL LOW (ref 36.0–46.0)
Hemoglobin: 9.1 g/dL — ABNORMAL LOW (ref 12.0–15.0)
MCH: 29.4 pg (ref 26.0–34.0)
MCHC: 33.5 g/dL (ref 30.0–36.0)
MCV: 87.7 fL (ref 80.0–100.0)
Platelets: 299 10*3/uL (ref 150–400)
RBC: 3.1 MIL/uL — ABNORMAL LOW (ref 3.87–5.11)
RDW: 12.6 % (ref 11.5–15.5)
WBC: 10.6 10*3/uL — ABNORMAL HIGH (ref 4.0–10.5)
nRBC: 0 % (ref 0.0–0.2)

## 2023-10-12 LAB — GLUCOSE, CAPILLARY
Glucose-Capillary: 109 mg/dL — ABNORMAL HIGH (ref 70–99)
Glucose-Capillary: 121 mg/dL — ABNORMAL HIGH (ref 70–99)
Glucose-Capillary: 125 mg/dL — ABNORMAL HIGH (ref 70–99)
Glucose-Capillary: 146 mg/dL — ABNORMAL HIGH (ref 70–99)
Glucose-Capillary: 147 mg/dL — ABNORMAL HIGH (ref 70–99)
Glucose-Capillary: 161 mg/dL — ABNORMAL HIGH (ref 70–99)

## 2023-10-12 LAB — MAGNESIUM: Magnesium: 1.9 mg/dL (ref 1.7–2.4)

## 2023-10-12 LAB — BASIC METABOLIC PANEL
Anion gap: 7 (ref 5–15)
BUN: 21 mg/dL (ref 8–23)
CO2: 28 mmol/L (ref 22–32)
Calcium: 8.3 mg/dL — ABNORMAL LOW (ref 8.9–10.3)
Chloride: 102 mmol/L (ref 98–111)
Creatinine, Ser: 0.88 mg/dL (ref 0.44–1.00)
GFR, Estimated: >60 mL/min (ref >60)
Glucose, Bld: 100 mg/dL — ABNORMAL HIGH (ref 70–99)
Potassium: 3.5 mmol/L (ref 3.5–5.1)
Sodium: 137 mmol/L (ref 135–145)

## 2023-10-12 NOTE — Plan of Care (Signed)
   Problem: Nutrition: Goal: Adequate nutrition will be maintained Outcome: Progressing

## 2023-10-12 NOTE — TOC Initial Note (Addendum)
Transition of Care [facility]) - Initial/Assessment Note    Patient Details  Name: Megan Pham MRN: [id number] Date of Birth: 01-Jul-1946  Transition of Care [facility]) CM/SW Contact:    Delilah Shan, LCSWA Phone Number: 10/12/2023, 12:58 PM  Clinical Narrative:                   CSW received consult for possible SNF placement at time of discharge. CSW spoke with patient  and patients friend June at bedside regarding PT recommendation of SNF placement at time of discharge. Patient reports she lives at home alone.Patient expressed understanding of PT recommendation and politely declined SNF placement at time of discharge. Patient confirmed plan is to discharge to her friend Junes home with home health services when medically ready for dc. June confirmed she can provide support for patient at her home.CSW informed patient CM will follow up to help assist with home needs. Patient thanked CSW. All questions answered. No further questions reported at this time. CSW informed CM.TOC to continue to follow and assist with discharge planning needs.   Expected Discharge Plan: Home w Home Health Services Barriers to Discharge: Continued Medical Work up   Patient Goals and CMS Choice Patient states their goals for this hospitalization and ongoing recovery are:: to discharge to friend Junes home   Choice offered to / list presented to : Patient      Expected Discharge Plan and Services In-house Referral: Clinical Social Work Discharge Planning Services: CM Consult Post Acute Care Choice: Home Health Living arrangements for the past 2 months: Single Family Home                                      Prior Living Arrangements/Services Living arrangements for the past 2 months: Single Family Home Lives with:: Self Patient language and need for interpreter reviewed:: Yes Do you feel safe going back to the place where you live?: Yes      Need for Family Participation in Patient Care: Yes  (Comment) Care giver support system in place?: Yes (comment)   Criminal Activity/Legal Involvement Pertinent to Current Situation/Hospitalization: No - Comment as needed  Activities of Daily Living   ADL Screening (condition at time of admission) Independently performs ADLs?: Yes (appropriate for developmental age) Is the patient deaf or have difficulty hearing?: Yes Does the patient have difficulty seeing, even when wearing glasses/contacts?: Yes Does the patient have difficulty concentrating, remembering, or making decisions?: No  Permission Sought/Granted Permission sought to share information with : Case Manager, Magazine features editor, Family Supports Permission granted to share information with : Yes, Verbal Permission Granted  Share Information with NAME: June  Permission granted to share info w AGENCY: SNF  Permission granted to share info w Relationship: friend  Permission granted to share info w Contact Information: June [phone]  Emotional Assessment Appearance:: Appears stated age Attitude/Demeanor/Rapport: Gracious Affect (typically observed): Calm Orientation: : Oriented to Self, Oriented to Place, Oriented to  Time, Oriented to Situation Alcohol / Substance Use: Not Applicable Psych Involvement: No (comment)  Admission diagnosis:  Coronary artery disease [I25.10] Patient Active Problem List   Diagnosis Date Noted   Atrial fib/flutter, transient (HCC) 10/10/2023   Postprocedural pneumothorax 10/09/2023   AKI (acute kidney injury) (HCC) 10/08/2023   Pleural effusion 10/08/2023   Acute pulmonary edema (HCC) 10/08/2023   ABLA (acute blood loss anemia) 10/08/2023   S/P CABG x 4  10/06/2023   Coronary artery disease 10/06/2023   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 [place] [address] Phone: [phone] Fax: [phone]     Social Drivers of Health (SDOH) Social History: SDOH Screenings   Food Insecurity: No Food Insecurity (10/07/2023)  Housing: Low Risk  (10/07/2023)  Transportation Needs: No Transportation Needs (10/07/2023)  Utilities: Not At Risk (10/07/2023)  Depression (PHQ2-9): Low Risk  (06/11/2022)  Financial Resource Strain: Low Risk  (04/08/2023)   Received from [facility]  Physical Activity: Sufficiently Active (04/08/2023)   Received from [facility]  Social Connections: Moderately Isolated (10/07/2023)  Stress: No Stress Concern Present (04/08/2023)   Received from [facility]  Tobacco Use: Medium Risk (10/06/2023)   SDOH Interventions:     Readmission Risk Interventions     No data to display

## 2023-10-12 NOTE — Progress Notes (Signed)
Mobility Specialist Progress Note:   10/12/23 1442  Mobility  Activity Transferred from bed to chair  Level of Assistance Moderate assist, patient does 50-74%  Assistive Device Front wheel walker  Distance Ambulated (ft) 3 ft  Activity Response Tolerated well  Mobility Referral Yes  Mobility visit 1 Mobility  Mobility Specialist Start Time (ACUTE ONLY) 1425  Mobility Specialist Stop Time (ACUTE ONLY) 1445  Mobility Specialist Time Calculation (min) (ACUTE ONLY) 20 min   Pt received in bed, agreeable to transfer to chair. Found with nasal canal slightly off nose. SpO2 in mid 90's. Pt needing ModA for bed mobility d/t sternal precautions. CG to stand and pivot towards chair. Pt c/o chest soreness from chest tube and slight SOB during session, otherwise asx throughout. Pt left in chair on 2 L with call bel in reach and all needs met. RN notified.   Leory Plowman  Mobility Specialist Please contact via [employer] office at [phone]

## 2023-10-12 NOTE — Progress Notes (Signed)
6 Days Post-Op Procedure(s) (LRB): CORONARY ARTERY BYPASS GRAFTING (CABG) TIMES FOUR USING LEFT INTERNAL MAMMARY ARTERY AND BILATERALLY ENDOSCOPICALLY HARVESTED GREATER SAPHENOUS VEINS (N/A) TRANSESOPHAGEAL ECHOCARDIOGRAM (TEE) (N/A) Subjective: Became acutely SOB  Objective: Vital signs in last 24 hours: Temp:  [97.7 F (36.5 C)-98.4 F (36.9 C)] 97.9 F (36.6 C) (02/02 0805) Pulse Rate:  [70-91] 79 (02/02 0805) Cardiac Rhythm: Normal sinus rhythm (02/01 2001) Resp:  [17-32] 32 (02/02 0821) BP: (112-179)/(43-60) 145/55 (02/02 0805) SpO2:  [92 %-98 %] 93 % (02/02 0805) Weight:  [70 kg] 70 kg (02/02 0404)  Hemodynamic parameters for last 24 hours:    Intake/Output from previous day: 02/01 0701 - 02/02 0700 In: 402.2 [P.O.:360; I.V.:42.2] Out: 1805 [Urine:1725; Chest Tube:80] Intake/Output this shift: No intake/output data recorded.  General appearance: alert, cooperative, and fatigued Heart: regular rate and rhythm Lungs: clear to auscultation bilaterally and + SQ air on left no wheezing Abdomen: benign Extremities: minor edema Wound: incis healing well  Lab Results: Recent Labs    10/11/23 0326 10/12/23 0318  WBC 8.6 10.6*  HGB 8.8* 9.1*  HCT 26.7* 27.2*  PLT 235 299   BMET:  Recent Labs    10/11/23 0326 10/12/23 0318  NA 139 137  K 3.3* 3.5  CL 101 102  CO2 28 28  GLUCOSE 133* 100*  BUN 24* 21  CREATININE 0.80 0.88  CALCIUM 8.3* 8.3*    PT/INR: No results for input(s): "LABPROT", "INR" in the last 72 hours. ABG    Component Value Date/Time   PHART 7.359 10/06/2023 1859   HCO3 23.9 10/06/2023 1859   TCO2 25 10/06/2023 1859   ACIDBASEDEF 2.0 10/06/2023 1859   O2SAT 97 10/06/2023 1859   CBG (last 3)  Recent Labs    10/12/23 0021 10/12/23 0403 10/12/23 0805  GLUCAP 121* 109* 146*    Meds Scheduled Meds:  amiodarone  400 mg Oral BID   arformoterol  15 mcg Nebulization BID   aspirin EC  325 mg Oral Daily   Or   aspirin  324 mg Per Tube  Daily   bisacodyl  10 mg Oral Daily   Or   bisacodyl  10 mg Rectal Daily   budesonide (PULMICORT) nebulizer solution  0.5 mg Nebulization BID   Chlorhexidine Gluconate Cloth  6 each Topical Daily   docusate sodium  200 mg Oral Daily   enoxaparin (LOVENOX) injection  40 mg Subcutaneous Q24H   guaiFENesin  600 mg Oral BID   insulin aspart  0-15 Units Subcutaneous Q4H   melatonin  3 mg Oral QHS   metoprolol succinate  25 mg Oral BID   pantoprazole  40 mg Oral Daily   polyethylene glycol  17 g Oral BID   potassium chloride  20 mEq Oral Daily   QUEtiapine  12.5 mg Oral QHS   revefenacin  175 mcg Nebulization Daily   rosuvastatin  40 mg Oral Daily   senna  2 tablet Oral BID   Continuous Infusions: PRN Meds:.diphenhydrAMINE **AND** acetaminophen, albuterol, metoprolol tartrate, ondansetron (ZOFRAN) IV, mouth rinse  Xrays DG CHEST PORT 1 VIEW Result Date: 10/10/2023 CLINICAL DATA:  Acute respiratory failure and pneumothorax. EXAM: PORTABLE CHEST 1 VIEW COMPARISON:  10/10/2023 5:55 a.m. FINDINGS: Prior CABG. A pigtail left pleural drainage catheters in place. Left pneumothorax observed, about 30% of left hemithoracic volume. This is stable to mildly increased compared to previous. Mild reduction in subcutaneous emphysema along the left chest and neck. Prior CABG. Borderline enlargement of the cardiopericardial silhouette.  Hazy obscuration of the right hemidiaphragm compatible with right lower lobe airspace opacity, cannot exclude right pleural effusion. IMPRESSION: 1. Left pneumothorax, about 30% of left hemithoracic volume. This is stable to mildly increased compared to previous. 2. Mild reduction in subcutaneous emphysema along the left chest and neck. 3. Hazy obscuration of the right hemidiaphragm compatible with right lower lobe airspace opacity, cannot exclude right pleural effusion. 4. Borderline enlargement of the cardiopericardial silhouette. Electronically Signed   By: Gaylyn Rong  M.D.   On: 10/10/2023 19:12    Assessment/Plan: S/P Procedure(s) (LRB): CORONARY ARTERY BYPASS GRAFTING (CABG) TIMES FOUR USING LEFT INTERNAL MAMMARY ARTERY AND BILATERALLY ENDOSCOPICALLY HARVESTED GREATER SAPHENOUS VEINS (N/A) TRANSESOPHAGEAL ECHOCARDIOGRAM (TEE) (N/A)  POD#6 CABG  1 afeb, S BP quite variable 80's-170's, will monitor trends before adding additional meds, NSR 2 sats ok on 2 liters 3 good UOP, weight appears below preop 4 BS controlled, pre-diabetic, on no home meds- cont diet manangement 5 normal renal fxn, not on lasix-  6 H/H fairly stable ABLA- expected 7 CT + air leak, will check stat CXR 8 she seems to have improved during my visit and increased F1O2- monitor closely Cont nebs and pulm hygiene 9 routine rehab as able 10 lovenox for DVT ppx     LOS: 6 days    Rowe Clack PA-C Pager [phone] 10/12/2023

## 2023-10-12 NOTE — TOC Progression Note (Signed)
Transition of Care [facility]) - Progression Note    Patient Details  Name: Megan Pham MRN: [id number] Date of Birth: 15-Aug-1946  Transition of Care [facility]) CM/SW Contact  Ronny Bacon, RN Phone Number: 10/12/2023, 3:02 PM  Clinical Narrative:  Secure chat received from CSW that patient is declining SNF and will go home with her friend. Phone call attempt made to patient room phone to discuss [facility] options and preference, no response.       Expected Discharge Plan: Home w Home Health Services Barriers to Discharge: Continued Medical Work up  Expected Discharge Plan and Services In-house Referral: Clinical Social Work Discharge Planning Services: CM Consult Post Acute Care Choice: Home Health Living arrangements for the past 2 months: Single Family Home                                       Social Determinants of Health (SDOH) Interventions SDOH Screenings   Food Insecurity: No Food Insecurity (10/07/2023)  Housing: Low Risk  (10/07/2023)  Transportation Needs: No Transportation Needs (10/07/2023)  Utilities: Not At Risk (10/07/2023)  Depression (PHQ2-9): Low Risk  (06/11/2022)  Financial Resource Strain: Low Risk  (04/08/2023)   Received from [facility]  Physical Activity: Sufficiently Active (04/08/2023)   Received from [facility]  Social Connections: Moderately Isolated (10/07/2023)  Stress: No Stress Concern Present (04/08/2023)   Received from [facility]  Tobacco Use: Medium Risk (10/06/2023)    Readmission Risk Interventions     No data to display

## 2023-10-13 LAB — CBC
HCT: 27.3 % — ABNORMAL LOW (ref 36.0–46.0)
Hemoglobin: 9.1 g/dL — ABNORMAL LOW (ref 12.0–15.0)
MCH: 29.7 pg (ref 26.0–34.0)
MCHC: 33.3 g/dL (ref 30.0–36.0)
MCV: 89.2 fL (ref 80.0–100.0)
Platelets: 308 10*3/uL (ref 150–400)
RBC: 3.06 MIL/uL — ABNORMAL LOW (ref 3.87–5.11)
RDW: 12.7 % (ref 11.5–15.5)
WBC: 9.8 10*3/uL (ref 4.0–10.5)
nRBC: 0 % (ref 0.0–0.2)

## 2023-10-13 LAB — BASIC METABOLIC PANEL
Anion gap: 9 (ref 5–15)
BUN: 18 mg/dL (ref 8–23)
CO2: 25 mmol/L (ref 22–32)
Calcium: 8.1 mg/dL — ABNORMAL LOW (ref 8.9–10.3)
Chloride: 103 mmol/L (ref 98–111)
Creatinine, Ser: 0.82 mg/dL (ref 0.44–1.00)
GFR, Estimated: >60 mL/min (ref >60)
Glucose, Bld: 96 mg/dL (ref 70–99)
Potassium: 3.7 mmol/L (ref 3.5–5.1)
Sodium: 137 mmol/L (ref 135–145)

## 2023-10-13 LAB — GLUCOSE, CAPILLARY
Glucose-Capillary: 107 mg/dL — ABNORMAL HIGH (ref 70–99)
Glucose-Capillary: 120 mg/dL — ABNORMAL HIGH (ref 70–99)
Glucose-Capillary: 152 mg/dL — ABNORMAL HIGH (ref 70–99)
Glucose-Capillary: 85 mg/dL (ref 70–99)
Glucose-Capillary: 98 mg/dL (ref 70–99)

## 2023-10-13 LAB — MAGNESIUM: Magnesium: 2 mg/dL (ref 1.7–2.4)

## 2023-10-13 MED ORDER — POTASSIUM CHLORIDE CRYS ER 20 MEQ PO TBCR
20.0000 meq | EXTENDED_RELEASE_TABLET | Freq: Every day | ORAL | Status: DC
  Administered 2023-10-14: 20 meq via ORAL
  Filled 2023-10-13: qty 1

## 2023-10-13 MED ORDER — FUROSEMIDE 40 MG PO TABS
40.0000 mg | ORAL_TABLET | Freq: Every day | ORAL | Status: DC
Start: 2023-10-13 — End: 2023-10-14
  Administered 2023-10-13: 40 mg via ORAL
  Filled 2023-10-13: qty 1

## 2023-10-13 MED ORDER — POTASSIUM CHLORIDE CRYS ER 20 MEQ PO TBCR
30.0000 meq | EXTENDED_RELEASE_TABLET | Freq: Two times a day (BID) | ORAL | Status: AC
Start: 2023-10-13 — End: 2023-10-13
  Administered 2023-10-13 (×2): 30 meq via ORAL
  Filled 2023-10-13 (×2): qty 1

## 2023-10-13 MED ORDER — TRAMADOL HCL 50 MG PO TABS
50.0000 mg | ORAL_TABLET | Freq: Four times a day (QID) | ORAL | Status: DC | PRN
  Administered 2023-10-13 – 2023-10-14 (×2): 50 mg via ORAL
  Filled 2023-10-13 (×2): qty 1

## 2023-10-13 MED ORDER — OXYCODONE HCL 5 MG PO TABS
5.0000 mg | ORAL_TABLET | ORAL | Status: DC | PRN
  Administered 2023-10-17: 5 mg via ORAL
  Filled 2023-10-13: qty 1

## 2023-10-13 MED FILL — Heparin Sodium (Porcine) Inj 1000 Unit/ML: Qty: 1000 | Status: AC

## 2023-10-13 MED FILL — Lidocaine HCl Local Preservative Free (PF) Inj 2%: INTRAMUSCULAR | Qty: 14 | Status: AC

## 2023-10-13 MED FILL — Potassium Chloride Inj 2 mEq/ML: INTRAVENOUS | Qty: 40 | Status: AC

## 2023-10-13 NOTE — Progress Notes (Signed)
      [address]       [place] [address]             [phone]        7 Days Post-Op Procedure(s) (LRB): CORONARY ARTERY BYPASS GRAFTING (CABG) TIMES FOUR USING LEFT INTERNAL MAMMARY ARTERY AND BILATERALLY ENDOSCOPICALLY HARVESTED GREATER SAPHENOUS VEINS (N/A) TRANSESOPHAGEAL ECHOCARDIOGRAM (TEE) (N/A)  Subjective: She is receiving breathing treatment this am. She sat in chair yesterday for 2 hours. She has no specific complaint this am.  Objective: Vital signs in last 24 hours: Temp:  [97.6 F (36.4 C)-99.5 F (37.5 C)] 97.6 F (36.4 C) (02/03 0336) Pulse Rate:  [64-88] 64 (02/03 0336) Cardiac Rhythm: Normal sinus rhythm (02/02 1932) Resp:  [16-32] 17 (02/03 0336) BP: (103-145)/(44-84) 118/44 (02/03 0336) SpO2:  [91 %-96 %] 94 % (02/03 0336) Weight:  [82.7 kg] 82.7 kg (02/03 0336)  Pre op weight 77.2 kg Current Weight  10/13/23 82.7 kg      Intake/Output from previous day: 02/02 0701 - 02/03 0700 In: 200 [P.O.:200] Out: 450 [Urine:400; Chest Tube:50]   Physical Exam:  Cardiovascular: RRR, Pulmonary: Clear to auscultation bilaterally; decreased subcutaneous emphysema on left Abdomen: Soft, non tender, bowel sounds present. Extremities: Mild bilateral lower extremity edema. Wounds: Clean and dry.  No erythema or signs of infection.  Lab Results: CBC: Recent Labs    10/12/23 0318 10/13/23 0335  WBC 10.6* 9.8  HGB 9.1* 9.1*  HCT 27.2* 27.3*  PLT 299 308   BMET:  Recent Labs    10/12/23 0318 10/13/23 0335  NA 137 137  K 3.5 3.7  CL 102 103  CO2 28 25  GLUCOSE 100* 96  BUN 21 18  CREATININE 0.88 0.82  CALCIUM 8.3* 8.1*    PT/INR:  Lab Results  Component Value Date   INR 1.6 (H) 10/06/2023   INR 1.0 10/02/2023   ABG:  INR: Will add last result for INR, ABG once components are confirmed Will add last 4 CBG results once components are confirmed  Assessment/Plan: 1. CV - SR. On Toprol XL 25 mg daily 2.  Pulmonary -  On 2L [place] this am. She may have undiagnosed COPD. Continue nebs as ordered per pulmonary. Left chest tube placed yesterday for left pneumothorax. Chest tube with 50 cc of output last 24 hours. Chest tube is to suction now, no air leak. As discussed with Dr. Cliffton Asters, place chest tube to water seal. No CXR ordered for today. Mucinex bid. Encourage incentive spirometer and flutter valve. 3. Above pre op weight, requires further diuresis-on Lasix 40 mg orally 4.  Expected post op acute blood loss anemia - H and H this am stable at 9.1 and 27.3 5. CBGs 147/107/85. Pre op HGA1C 6. She has pre diabetes. Will stop accu checks and SS PRN.. Will provide nutrition information with discharge paperwork.   6. GI-continue scheduled Reglan for nausea, continue Zofran PRN.  7. On Lovenox for DVT prophylaxis 8. Supplement potassium 9. She is very deconditioned PT/OT 10. Once ready for discharge, will go home with friend. She does not want SNF  Nazar Kuan M ZimmermanPA-C 7:23 AM

## 2023-10-13 NOTE — Plan of Care (Signed)
  Problem: Clinical Measurements: Goal: Cardiovascular complication will be avoided Outcome: Progressing   

## 2023-10-13 NOTE — Progress Notes (Signed)
Occupational Therapy Treatment Patient Details Name: Megan Pham MRN: [id number] DOB: April 04, 1946 Today's Date: 10/13/2023   History of present illness 78 y/o female admitted 1/27 for CABG x 4. 1/30 Lt PTX with chest tube placed. PMH: CAD, HTN, OA, HLD   OT comments  Pt making gradual progress towards OT goals with improving cognition and balance though remains limited by DOE w/ minimal activity. Pt requires mod A for bed mobility, CGA-Min A for BSC transfers and mobility in room with RW. Pt continues to require hands on assist for ADLs though improving. Noted pt declined inpatient rehab stay, reports friend, Megan Pham able to provide physical assist at DC.       If plan is discharge home, recommend the following:  A little help with walking and/or transfers;A lot of help with bathing/dressing/bathroom   Equipment Recommendations  BSC/3in1;Other (comment) (RW)    Recommendations for Other Services      Precautions / Restrictions Precautions Precautions: Fall;Sternal;Other (comment) Precaution Booklet Issued: Yes (comment) Precaution Comments: chest tube Restrictions Weight Bearing Restrictions Per Provider Order: No       Mobility Bed Mobility Overal bed mobility: Needs Assistance Bed Mobility: Supine to Sit     Supine to sit: Mod assist, HOB elevated     General bed mobility comments: able to bring LE to EOB. assist to lift trunk and scoot hips to EOB while holding heart pillow. educated potential need to sleep in recliner if bed mobility still difficult initially    Transfers Overall transfer level: Needs assistance Equipment used: Rolling walker (2 wheels) Transfers: Sit to/from Stand, Bed to chair/wheelchair/BSC Sit to Stand: Contact guard assist, Min assist     Step pivot transfers: Min assist, Contact guard assist     General transfer comment: Variable Min A to CGA to stand from bed and BSC w/ good follow through of hands on lap. Min A for RW manuevering to/from  [facility]     Balance Overall balance assessment: Needs assistance Sitting-balance support: No upper extremity supported, Feet supported Sitting balance-Leahy Scale: Fair     Standing balance support: Bilateral upper extremity supported, During functional activity, Reliant on assistive device for balance Standing balance-Leahy Scale: Poor                             ADL either performed or assessed with clinical judgement   ADL Overall ADL's : Needs assistance/impaired     Grooming: Set up;Sitting;Wash/dry face           Upper Body Dressing : Minimal assistance;Sitting       Toilet Transfer: Minimal assistance;Stand-pivot;BSC/3in1;Rolling walker (2 wheels) Toilet Transfer Details (indicate cue type and reason): to/from [facility] x 2 as pt reported need for BM though unsuccessful then reported need to urinate once off of BSC Toileting- Clothing Manipulation and Hygiene: Minimal assistance;Sit to/from stand;Sitting/lateral lean Toileting - Clothing Manipulation Details (indicate cue type and reason): assist for hygiene x 2 as pt unable to reach while sitting on BSC     Functional mobility during ADLs: Minimal assistance;Rolling walker (2 wheels)      Extremity/Trunk Assessment Upper Extremity Assessment Upper Extremity Assessment: Generalized weakness;Right hand dominant   Lower Extremity Assessment Lower Extremity Assessment: Defer to PT evaluation        Vision   Vision Assessment?: No apparent visual deficits   Perception     Praxis      Cognition Arousal: Alert Behavior During Therapy: Flat affect, [facility]  for tasks assessed/performed Overall Cognitive Status: Impaired/Different from baseline Area of Impairment: Attention, Memory, Awareness                   Current Attention Level: Selective Memory: Decreased recall of precautions     Awareness: Emergent            Exercises      Shoulder Instructions       General Comments       Pertinent Vitals/ Pain       Pain Assessment Pain Assessment: No/denies pain  Home Living                                          Prior Functioning/Environment              Frequency  Min 1X/week        Progress Toward Goals  OT Goals(current goals can now be found in the care plan section)  Progress towards OT goals: Progressing toward goals  Acute Rehab OT Goals Patient Stated Goal: go to friend's house at DC OT Goal Formulation: With patient Time For Goal Achievement: 10/24/23 Potential to Achieve Goals: Good ADL Goals Pt Will Perform Lower Body Bathing: with set-up;sit to/from stand;sitting/lateral leans Pt Will Perform Lower Body Dressing: with set-up;sitting/lateral leans;sit to/from stand Pt Will Transfer to Toilet: with contact guard assist;ambulating  Plan      Co-evaluation                 AM-PAC OT "6 Clicks" Daily Activity     Outcome Measure   Help from another person eating meals?: A Little Help from another person taking care of personal grooming?: A Little Help from another person toileting, which includes using toliet, bedpan, or urinal?: A Lot Help from another person bathing (including washing, rinsing, drying)?: A Lot Help from another person to put on and taking off regular upper body clothing?: A Little Help from another person to put on and taking off regular lower body clothing?: A Lot 6 Click Score: 15    End of Session Equipment Utilized During Treatment: Rolling walker (2 wheels);Oxygen  OT Visit Diagnosis: Unsteadiness on feet (R26.81);Other abnormalities of gait and mobility (R26.89);Muscle weakness (generalized) (M62.81)   Activity Tolerance Patient tolerated treatment well   Patient Left in chair;with call bell/phone within reach   Nurse Communication Mobility status        Time: 1191-4782 OT Time Calculation (min): 41 min  Charges: OT General Charges $OT Visit: 1 Visit OT  Treatments $Self Care/Home Management : 38-52 mins  Bradd Canary, OTR/L Acute Rehab Services Office: [phone]   Lorre Munroe 10/13/2023, 2:20 PM

## 2023-10-14 ENCOUNTER — Inpatient Hospital Stay (HOSPITAL_COMMUNITY): Payer: 59

## 2023-10-14 LAB — GLUCOSE, CAPILLARY: Glucose-Capillary: 113 mg/dL — ABNORMAL HIGH (ref 70–99)

## 2023-10-14 MED ORDER — FUROSEMIDE 10 MG/ML IJ SOLN
40.0000 mg | Freq: Once | INTRAMUSCULAR | Status: AC
  Administered 2023-10-14: 40 mg via INTRAVENOUS
  Filled 2023-10-14: qty 4

## 2023-10-14 MED ORDER — FUROSEMIDE 40 MG PO TABS
40.0000 mg | ORAL_TABLET | Freq: Every day | ORAL | Status: DC
  Administered 2023-10-15 – 2023-10-18 (×4): 40 mg via ORAL
  Filled 2023-10-14 (×4): qty 1

## 2023-10-14 NOTE — Progress Notes (Addendum)
      [address]       [place] [address]             [phone]        8 Days Post-Op Procedure(s) (LRB): CORONARY ARTERY BYPASS GRAFTING (CABG) TIMES FOUR USING LEFT INTERNAL MAMMARY ARTERY AND BILATERALLY ENDOSCOPICALLY HARVESTED GREATER SAPHENOUS VEINS (N/A) TRANSESOPHAGEAL ECHOCARDIOGRAM (TEE) (N/A)  Subjective: Patient states breathing yesterday seemed harder. She is just waking up this am. She is asking for coffee.  Objective: Vital signs in last 24 hours: Temp:  [97.4 F (36.3 C)-98.1 F (36.7 C)] 97.5 F (36.4 C) (02/04 0316) Pulse Rate:  [64-90] 64 (02/04 0316) Cardiac Rhythm: Normal sinus rhythm (02/03 1900) Resp:  [20-41] 20 (02/04 0316) BP: (96-155)/(44-92) 96/52 (02/04 0316) SpO2:  [89 %-96 %] 91 % (02/04 0316) Weight:  [81.3 kg] 81.3 kg (02/04 0316)  Pre op weight 77.2 kg Current Weight  10/14/23 81.3 kg      Intake/Output from previous day: 02/03 0701 - 02/04 0700 In: 720 [P.Megan.:720] Out: 120 [Chest Tube:120]   Physical Exam:  Cardiovascular: RRR Pulmonary: Diminished on left and subcutaneous emphysema left anterior/lateral chest wall and neck and diminished right base Abdomen: Soft, non tender, bowel sounds present. Extremities: Mild bilateral lower extremity edema. Wounds: Sternal and bilateral LE wounds are clean and dry.  No erythema or signs of infection.  Lab Results: CBC: Recent Labs    10/12/23 0318 10/13/23 0335  WBC 10.6* 9.8  HGB 9.1* 9.1*  HCT 27.2* 27.3*  PLT 299 308   BMET:  Recent Labs    10/12/23 0318 10/13/23 0335  NA 137 137  K 3.5 3.7  CL 102 103  CO2 28 25  GLUCOSE 100* 96  BUN 21 18  CREATININE 0.88 0.82  CALCIUM  8.3* 8.1*    PT/INR:  Lab Results  Component Value Date   INR 1.6 (H) 10/06/2023   INR 1.0 10/02/2023   ABG:  INR: Will add last result for INR, ABG once components are confirmed Will add last 4 CBG results once components are confirmed  Assessment/Plan: 1. CV -  SR. On Toprol  XL 25 mg daily 2.  Pulmonary - On 4 L [place] this am. She may have undiagnosed COPD. Continue nebs as ordered per pulmonary. Left chest tube placed for left pneumothorax 01/30. Chest tube with 120 cc of output last 12 hours. Chest tube placed to water seal 02/03.  CXR this am appears to show increased left pneumothorax, some subcutaneous emphysema left lateral chest wall, and bilateral pleural effusion/atelectasis, and vascular congestion. Will place chest tube to suction. Mucinex  bid. Encourage incentive spirometer and flutter valve. 3. Above pre op weight, requires further diuresis-on Lasix  40 mg orally but will give IV today 4.  Expected post op acute blood loss anemia - H and H this am stable at 9.1 and 27.3 6. On Lovenox  for DVT prophylaxis 7. She is very deconditioned PT/OT 8. Once ready for discharge, will go home with friend. She does not want SNF  Megan Pham 6:56 AM  Agree with above Dispo planning  Megan Pham Megan Pham

## 2023-10-14 NOTE — Plan of Care (Signed)
  Problem: Health Behavior/Discharge Planning: Goal: Ability to manage health-related needs will improve Outcome: Progressing   Problem: Clinical Measurements: Goal: Respiratory complications will improve Outcome: Progressing   Problem: Clinical Measurements: Goal: Diagnostic test results will improve Outcome: Progressing   Problem: Coping: Goal: Level of anxiety will decrease Outcome: Progressing

## 2023-10-14 NOTE — Progress Notes (Signed)
 Informed to PA oncall about her BP and MAP, patient was asymptomatic, no any new orders.  10/14/23 1630  Vitals  BP (!) 130/35  MAP (mmHg) (!) 64  BP Location Left Arm  BP Method Automatic  Patient Position (if appropriate) Lying  Pulse Rate 66  Pulse Rate Source Monitor  ECG Heart Rate 65  Resp 20  Level of Consciousness  Level of Consciousness Alert  MEWS COLOR  MEWS Score Color Green  Oxygen Therapy  SpO2 96 %  O2 Device Nasal Cannula  O2 Flow Rate (L/min) 4 L/min  MEWS Score  MEWS Temp 0  MEWS Systolic 0  MEWS Pulse 0  MEWS RR 0  MEWS LOC 0  MEWS Score 0

## 2023-10-14 NOTE — Progress Notes (Signed)
 Physical Therapy Treatment Patient Details Name: Megan Pham MRN: [id number] DOB: 1946-01-14 Today's Date: 10/14/2023   History of Present Illness 78 y/o female admitted 1/27 for CABG x 4. 1/30 Lt PTX with chest tube placed. PMH: CAD, HTN, OA, HLD    PT Comments  Pt with slow progression with mobility needing mod assist for bed mobility and maximum distance 30' with encouragement and chair follow. Pt states her friend June is in her 9s and has a 30yo roommate who can assist but works. Discussed recommendation for post acute rehab as pt not yet mobilizing well enough to go to June's house and continue to recommend rehab. Pt educated for all precautions as she remains unable to recall them and required 3L O2 at rest and 5L with gait to maintain SPO2 >90%. Will continue to follow.   HR 86-90 Pre gait 153/105 Post gait  155/74 SPO2 87% on RA on arrival with return to 3L and required 5L during gait with SPO2 88-94%   If plan is discharge home, recommend the following: Assistance with cooking/housework;Assist for transportation;A lot of help with walking and/or transfers;A lot of help with bathing/dressing/bathroom   Can travel by private vehicle     No  Equipment Recommendations  Rolling walker (2 wheels);BSC/3in1    Recommendations for Other Services       Precautions / Restrictions Precautions Precautions: Fall;Sternal;Other (comment) Precaution Comments: chest tube, watch sats     Mobility  Bed Mobility Overal bed mobility: Needs Assistance Bed Mobility: Rolling, Sidelying to Sit Rolling: Mod assist Sidelying to sit: Mod assist       General bed mobility comments: mod assist to roll, clear legs off surface and elevate trunk with increased time and mod cues    Transfers Overall transfer level: Needs assistance   Transfers: Sit to/from Stand, Bed to chair/wheelchair/BSC Sit to Stand: Contact guard assist Stand pivot transfers: Contact guard assist         General  transfer comment: pt able to rise from bed, BSC and recliner with cues for hand placement and safety. stand pivot bed to [facility] with RW with mod cues    Ambulation/Gait Ambulation/Gait assistance: Min assist, +2 safety/equipment Gait Distance (Feet): 30 Feet Assistive device: Rolling walker (2 wheels) Gait Pattern/deviations: Step-through pattern, Decreased stride length, Trunk flexed   Gait velocity interpretation: <1.31 ft/sec, indicative of household ambulator   General Gait Details: cue for looking up, posture, proximity to Rw, increased stride and distance. Close chair follow. Pt walked 8', 22', 30', limited by fatigue and SOB with SPO2 88-94% on 5L with gait   Stairs             Wheelchair Mobility     Tilt Bed    Modified Rankin (Stroke Patients Only)       Balance Overall balance assessment: Needs assistance Sitting-balance support: No upper extremity supported, Feet supported Sitting balance-Leahy Scale: Good     Standing balance support: Bilateral upper extremity supported, During functional activity, Reliant on assistive device for balance Standing balance-Leahy Scale: Poor Standing balance comment: Rw for gait                            Cognition Arousal: Alert Behavior During Therapy: Flat affect, WFL for tasks assessed/performed Overall Cognitive Status: Impaired/Different from baseline                       Memory: Decreased recall of  precautions         General Comments: pt stating 1/4 precautions with education for all throughout session        Exercises      General Comments        Pertinent Vitals/Pain Pain Assessment Pain Assessment: 0-10 Pain Score: 5  Pain Location: sternum and CT site Pain Descriptors / Indicators: Sore, Aching Pain Intervention(s): Limited activity within patient's tolerance, Monitored during session, Repositioned    Home Living                          Prior Function             PT Goals (current goals can now be found in the care plan section) Progress towards PT goals: Progressing toward goals    Frequency    Min 1X/week      PT Plan      Co-evaluation              AM-PAC PT 6 Clicks Mobility   Outcome Measure  Help needed turning from your back to your side while in a flat bed without using bedrails?: A Lot Help needed moving from lying on your back to sitting on the side of a flat bed without using bedrails?: A Lot Help needed moving to and from a bed to a chair (including a wheelchair)?: A Little Help needed standing up from a chair using your arms (e.g., wheelchair or bedside chair)?: A Little Help needed to walk in hospital room?: A Lot Help needed climbing 3-5 steps with a railing? : Total 6 Click Score: 13    End of Session Equipment Utilized During Treatment: Oxygen Activity Tolerance: Patient limited by fatigue Patient left: in chair;with call bell/phone within reach;with chair alarm set Nurse Communication: Mobility status;Precautions PT Visit Diagnosis: Unsteadiness on feet (R26.81);Other abnormalities of gait and mobility (R26.89);Pain     Time: 9240-9167 PT Time Calculation (min) (ACUTE ONLY): 33 min  Charges:    $Gait Training: 8-22 mins $Therapeutic Activity: 8-22 mins PT General Charges $$ ACUTE PT VISIT: 1 Visit                     Lenoard SQUIBB, PT Acute Rehabilitation Services Office: [phone]    Lenoard NOVAK Esteven Overfelt 10/14/2023, 9:50 AM

## 2023-10-15 ENCOUNTER — Inpatient Hospital Stay (HOSPITAL_COMMUNITY): Payer: 59

## 2023-10-15 HISTORY — PX: IR THORACENTESIS ASP PLEURAL SPACE W/IMG GUIDE: IMG5380

## 2023-10-15 LAB — BASIC METABOLIC PANEL
Anion gap: 10 (ref 5–15)
BUN: 15 mg/dL (ref 8–23)
CO2: 25 mmol/L (ref 22–32)
Calcium: 8.2 mg/dL — ABNORMAL LOW (ref 8.9–10.3)
Chloride: 100 mmol/L (ref 98–111)
Creatinine, Ser: 0.81 mg/dL (ref 0.44–1.00)
GFR, Estimated: >60 mL/min (ref >60)
Glucose, Bld: 99 mg/dL (ref 70–99)
Potassium: 3.5 mmol/L (ref 3.5–5.1)
Sodium: 135 mmol/L (ref 135–145)

## 2023-10-15 LAB — GLUCOSE, CAPILLARY
Glucose-Capillary: 85 mg/dL (ref 70–99)
Glucose-Capillary: 152 mg/dL — ABNORMAL HIGH (ref 70–99)
Glucose-Capillary: 105 mg/dL — ABNORMAL HIGH (ref 70–99)
Glucose-Capillary: 165 mg/dL — ABNORMAL HIGH (ref 70–99)

## 2023-10-15 MED ORDER — POTASSIUM CHLORIDE CRYS ER 20 MEQ PO TBCR
40.0000 meq | EXTENDED_RELEASE_TABLET | Freq: Two times a day (BID) | ORAL | Status: AC
  Administered 2023-10-15 (×2): 40 meq via ORAL
  Filled 2023-10-15 (×2): qty 2

## 2023-10-15 MED ORDER — LIDOCAINE HCL 1 % IJ SOLN
INTRAMUSCULAR | Status: AC
  Filled 2023-10-15: qty 20

## 2023-10-15 MED ORDER — LIDOCAINE HCL 1 % IJ SOLN
20.0000 mL | Freq: Once | INTRAMUSCULAR | Status: AC
  Administered 2023-10-15: 10 mL via INTRADERMAL

## 2023-10-15 MED ORDER — POTASSIUM CHLORIDE CRYS ER 20 MEQ PO TBCR
20.0000 meq | EXTENDED_RELEASE_TABLET | Freq: Every day | ORAL | Status: DC
  Administered 2023-10-16 – 2023-10-18 (×3): 20 meq via ORAL
  Filled 2023-10-15 (×3): qty 1

## 2023-10-15 NOTE — Progress Notes (Signed)
 Mobility Specialist Progress Note:  Nurse requested Mobility Specialist to perform oxygen saturation test with pt which includes removing pt from oxygen both at rest and while ambulating.  Below are the results from that testing.     Patient Saturations on Room Air at Rest = spO2 98%  Patient Saturations on Room Air while Ambulating = sp02 90% .  Rested and performed pursed lip breathing for 1 minute with sp02 at 95%.  At end of testing pt left in room on 0  Liters of oxygen.  Reported results to nurse.    Thersia Minder Mobility Specialist  Please contact vis Secure Chat or  Rehab Office [phone]

## 2023-10-15 NOTE — Progress Notes (Addendum)
      [address]       [place] [address]             [phone]        9 Days Post-Op Procedure(s) (LRB): CORONARY ARTERY BYPASS GRAFTING (CABG) TIMES FOUR USING LEFT INTERNAL MAMMARY ARTERY AND BILATERALLY ENDOSCOPICALLY HARVESTED GREATER SAPHENOUS VEINS (N/A) TRANSESOPHAGEAL ECHOCARDIOGRAM (TEE) (N/A)  Subjective: Patient states breathing is much better. She wants to sit in the chair.  Objective: Vital signs in last 24 hours: Temp:  [97.4 F (36.3 C)-97.9 F (36.6 C)] 97.8 F (36.6 C) (02/05 0300) Pulse Rate:  [64-82] 66 (02/04 1630) Cardiac Rhythm: Normal sinus rhythm (02/04 2016) Resp:  [15-21] 15 (02/05 0300) BP: (95-146)/(35-63) 95/38 (02/05 0300) SpO2:  [90 %-96 %] 96 % (02/04 2040)  Pre op weight 77.2 kg Current Weight  10/14/23 81.3 kg      Intake/Output from previous day: 02/04 0701 - 02/05 0700 In: 720 [P.O.:720] Out: 451 [Urine:450; Stool:1]   Physical Exam:  Cardiovascular: RRR Pulmonary: Clear on the left, stable subcutaneous emphysema left anterior/lateral chest wall and neck and diminished right base Abdomen: Soft, non tender, bowel sounds present. Extremities: Mild bilateral lower extremity edema. Wounds: Sternal and bilateral LE wounds are clean and dry.  No erythema or signs of infection. Left chest tube: to suction, no air leak  Lab Results: CBC: Recent Labs    10/13/23 0335  WBC 9.8  HGB 9.1*  HCT 27.3*  PLT 308   BMET:  Recent Labs    10/13/23 0335 10/15/23 0329  NA 137 135  K 3.7 3.5  CL 103 100  CO2 25 25  GLUCOSE 96 99  BUN 18 15  CREATININE 0.82 0.81  CALCIUM  8.1* 8.2*    PT/INR:  Lab Results  Component Value Date   INR 1.6 (H) 10/06/2023   INR 1.0 10/02/2023   ABG:  INR: Will add last result for INR, ABG once components are confirmed Will add last 4 CBG results once components are confirmed  Assessment/Plan: 1. CV - SR. On Toprol  XL 25 mg daily 2.  Pulmonary - On 3 L [place] this am. She  may have undiagnosed COPD. Continue nebs. Left chest tube placed for left pneumothorax 01/30. Chest tube output not recorded for last 24 hours. Chest tube is to suction.CXR this am appears to shows much improved left pneumothorax;?trace left apical,some subcutaneous emphysema left lateral chest wall, and bilateral pleural effusion/atelectasis R>L.  Likely place chest tube to water seal soon. Will ask IR to evaluate for right thoracentesis. Mucinex  bid. Encourage incentive spirometer and flutter valve. 3. Above pre op weight, requires further diuresis-on Lasix  40 mg orally 4.  Expected post op acute blood loss anemia - Last H and H stable at 9.1 and 27.3 5. Supplement potassium 6. On Lovenox  for DVT prophylaxis 7. She is very deconditioned-continue with PT/OT 8. Once ready for discharge, will go home with friend. She does not want SNF  Donielle M ZimmermanPA-C 6:56 AM   Agree with above Dispo planning  Thirza Pellicano O Candela Krul

## 2023-10-15 NOTE — Plan of Care (Signed)
  Problem: Clinical Measurements: Goal: Will remain free from infection Outcome: Progressing Goal: Cardiovascular complication will be avoided Outcome: Progressing   Problem: Nutrition: Goal: Adequate nutrition will be maintained Outcome: Progressing   Problem: Coping: Goal: Level of anxiety will decrease Outcome: Progressing   Problem: Elimination: Goal: Will not experience complications related to bowel motility Outcome: Progressing Goal: Will not experience complications related to urinary retention Outcome: Progressing   Problem: Pain Managment: Goal: General experience of comfort will improve and/or be controlled Outcome: Progressing

## 2023-10-15 NOTE — Progress Notes (Signed)
 Spoke with [place], [place].  Ok'd to remove suction from chest tube to perform walking O2 sat.

## 2023-10-15 NOTE — Procedures (Signed)
 PROCEDURE SUMMARY:  Successful image-guided right therapeutic thoracentesis. Yielded 350 cc of bloody pleural fluid. Patient tolerated procedure well. EBL: Zero No immediate complications.   Post procedure CXR pending at this time.  Please see imaging section of Epic for full dictation.  Jonette Wassel A Octavio Matheney PA-C 10/15/2023 10:07 AM

## 2023-10-15 NOTE — Progress Notes (Signed)
 Mobility Specialist Progress Note:    10/15/23 1530  Mobility  Activity Ambulated with assistance in hallway  Level of Assistance Contact guard assist, steadying assist  Assistive Device Front wheel walker  Distance Ambulated (ft) 200 ft  Activity Response Tolerated well  Mobility Referral Yes  Mobility visit 1 Mobility  Mobility Specialist Start Time (ACUTE ONLY) 1430  Mobility Specialist Stop Time (ACUTE ONLY) 1445  Mobility Specialist Time Calculation (min) (ACUTE ONLY) 15 min   Received pt in chair having no complaints and agreeable to mobility. Had a chair follow for safety, pt took x1 seated break d/t fatigue. Ambulate on RA throughout sessio SPO2 stayed between 90%-95%. Returned to room w/o fault. Left in chair w/ call bell in reach and all needs met. Left on RA. RN aware.    Thersia Minder Mobility Specialist  Please contact vis Secure Chat or  Rehab Office [phone]

## 2023-10-16 ENCOUNTER — Ambulatory Visit: Payer: 59 | Admitting: Cardiology

## 2023-10-16 ENCOUNTER — Inpatient Hospital Stay (HOSPITAL_COMMUNITY): Payer: 59

## 2023-10-16 NOTE — Progress Notes (Signed)
 Attempt x1 to administer morning breathing treatments. Pt not available.

## 2023-10-16 NOTE — TOC Progression Note (Signed)
 Transition of Care (TOC) - Progression Note  Rayfield Gobble RN, BSN Transitions of Care Unit 4E- RN Case Manager See Treatment Team for direct phone #   Patient Details  Name: Megan Pham MRN: [id number] Date of Birth: 08-06-1946  [facility] CM/SW Contact  Gobble, Rayfield Hurst, RN Phone Number: 10/16/2023, 4:31 PM  Clinical Narrative:    Notified by therapy today that pt actually needs rollator instead of RW- Order modified to rollator for home- CM contacted [employer] liaison to change out RW for Rollator for DME delivery.   DME has been delivered to the room- rollator and BSC.   CM spoke with pt at bedside to confirm [facility] arrangements- pt has TCTS office referral to [facility]- pt voiced she has received call however did not speak with them yet. Explained referral and offered choice- per pt she would like to see list for [facility] choice before agreeing to [facility] referral. List provided Per CMS guidelines from [email or web address] website with star ratings (copy placed in shadow chart).  CM will f/u tomorrow for [facility] needs as well as to confirm address to friend June's home that pt plans to stay with post discharge.    Expected Discharge Plan: Home w Home Health Services Barriers to Discharge: Continued Medical Work up  Expected Discharge Plan and Services In-house Referral: Clinical Social Work Discharge Planning Services: CM Consult Post Acute Care Choice: Durable Medical Equipment, Home Health Living arrangements for the past 2 months: Single Family Home                 DME Arranged: Bedside commode, Walker rolling with seat, Walker rolling DME Agency: [employer] Date DME Agency Contacted: 10/15/23 Time DME Agency Contacted: 1500 Representative spoke with at DME Agency: Mitch HH Arranged: RN, PT, OT HH Agency: [facility] ([facility])     Representative spoke with at [facility] Agency: Zebedee   Social Determinants of Health (SDOH) Interventions SDOH Screenings    Food Insecurity: No Food Insecurity (10/07/2023)  Housing: Low Risk  (10/07/2023)  Transportation Needs: No Transportation Needs (10/07/2023)  Utilities: Not At Risk (10/07/2023)  Depression (PHQ2-9): Low Risk  (06/11/2022)  Financial Resource Strain: Low Risk  (04/08/2023)   Received from [facility]  Physical Activity: Sufficiently Active (04/08/2023)   [facility] from [facility]  Social Connections: Moderately Isolated (10/07/2023)  Stress: No Stress Concern Present (04/08/2023)   [facility]  Tobacco Use: Medium Risk (10/06/2023)    Readmission Risk Interventions     No data to display

## 2023-10-16 NOTE — Progress Notes (Addendum)
      [address]       [place] [place] [address]             [phone]        10 Days Post-Op Procedure(s) (LRB): CORONARY ARTERY BYPASS GRAFTING (CABG) TIMES FOUR USING LEFT INTERNAL MAMMARY ARTERY AND BILATERALLY ENDOSCOPICALLY HARVESTED GREATER SAPHENOUS VEINS (N/A) TRANSESOPHAGEAL ECHOCARDIOGRAM (TEE) (N/A)  Subjective: Patient sitting in chair. She is feeling better, breathing better.  Objective: Vital signs in last 24 hours: Temp:  [97.7 F (36.5 C)-98.5 F (36.9 C)] 97.7 F (36.5 C) (02/06 0440) Pulse Rate:  [61-73] 62 (02/06 0440) Cardiac Rhythm: Normal sinus rhythm (02/05 2000) Resp:  [15-26] 20 (02/06 0440) BP: (125-152)/(38-51) 125/47 (02/06 0440) SpO2:  [94 %-99 %] 94 % (02/06 0440) Weight:  [78.3 kg] 78.3 kg (02/06 0440)  Pre op weight 77.2 kg Current Weight  10/16/23 78.3 kg      Intake/Output from previous day: 02/05 0701 - 02/06 0700 In: -  Out: 1 [Urine:1]   Physical Exam:  Cardiovascular: RRR Pulmonary: Slightly diminished bibasilar breath sounds,decreased subcutaneous emphysema left anterior/lateral chest wall and neck  Abdomen: Soft, non tender, bowel sounds present. Extremities: Decreasing bilateral lower extremity edema. Wounds: Sternal and bilateral LE wounds are clean and dry.  No erythema or signs of infection. Left chest tube: to suction, no air leak  Lab Results: CBC: No results for input(s): WBC, HGB, HCT, PLT in the last 72 hours.  BMET:  Recent Labs    10/15/23 0329  NA 135  K 3.5  CL 100  CO2 25  GLUCOSE 99  BUN 15  CREATININE 0.81  CALCIUM  8.2*    PT/INR:  Lab Results  Component Value Date   INR 1.6 (H) 10/06/2023   INR 1.0 10/02/2023   ABG:  INR: Will add last result for INR, ABG once components are confirmed Will add last 4 CBG results once components are confirmed  Assessment/Plan: 1. CV - SR. On Toprol  XL 25 mg daily 2.  Pulmonary - S/p right thoracentesis;350 cc removed. On room  air this am.. It now appears she will not need oxygen at discharge.  She may have undiagnosed COPD. Continue nebs. Left chest tube placed for left pneumothorax 01/30. Chest tube output not recorded for last 24 hours. Chest tube is to suction. CXR this am appears to shows minor subcutaneous emphysema left lateral chest wall, and small bilateral pleural effusion/atelectasis.  Likely place chest tube to water seal today. . Mucinex  bid. Encourage incentive spirometer and flutter valve. 3. Above pre op weight, requires further diuresis-on Lasix  40 mg orally 4.  Expected post op acute blood loss anemia - Last H and H stable at 9.1 and 27.3 5. On Lovenox  for DVT prophylaxis 6. She is very deconditioned-continue with PT/OT 7. Disposition-Getting closer discharge. When ready, she will go home with friend. She does not want SNF  Donielle M ZimmermanPA-C 6:55 AM   Agree with above CT to waterseal Dispo planning  Kwanza Cancelliere O Selisa Tensley

## 2023-10-16 NOTE — Progress Notes (Signed)
 Mobility Specialist Progress Note:    10/16/23 1039  Mobility  Activity Transferred from chair to bed  Level of Assistance Minimal assist, patient does 75% or more  Distance Ambulated (ft) 3 ft  Activity Response Tolerated well  Mobility Referral Yes  Mobility visit 1 Mobility  Mobility Specialist Start Time (ACUTE ONLY) 1030  Mobility Specialist Stop Time (ACUTE ONLY) 1039  Mobility Specialist Time Calculation (min) (ACUTE ONLY) 9 min   Pt requested to transfer C>B. Tolerated well, audible SOB and R chest pain. MinA required to reposition in bed. Left with all needs met, call bell and phone in reach, bed alarm on.    Bradon Fester Mobility Specialist Please contact via Special Educational Needs Teacher or  Rehab office at [phone]

## 2023-10-16 NOTE — Plan of Care (Signed)

## 2023-10-16 NOTE — Progress Notes (Signed)
 Physical Therapy Treatment Patient Details Name: Megan Pham MRN: [id number] DOB: 1946-04-13 Today's Date: 10/16/2023   History of Present Illness 78 y/o female admitted 1/27 for CABG x 4. 1/30 Lt PTX with chest tube placed. PMH: CAD, HTN, OA, HLD    PT Comments  PT pleasant, perky and breathing much better this session on RA with SPO2 90-95%. Pt with increased ability with transfers and gait and remarkably better with HHPT now appropriate for pt mobility. PT educated for precautions, transfers, HEP and progression.   HR 72-81 Pre gait 149/55 (82) Post gait 155/39 (74)    If plan is discharge home, recommend the following: Assistance with cooking/housework;Assist for transportation;A little help with walking and/or transfers;A little help with bathing/dressing/bathroom   Can travel by private vehicle     Yes  Equipment Recommendations  Rollator (4 wheels);BSC/3in1    Recommendations for Other Services       Precautions / Restrictions Precautions Precautions: Fall;Sternal;Other (comment) Precaution Comments: chest tube, watch sats     Mobility  Bed Mobility               General bed mobility comments: in chair on arrival and end of session    Transfers Overall transfer level: Modified independent                 General transfer comment: pt able to stand from bed, bSC and recliner without assist with proper hand placement    Ambulation/Gait Ambulation/Gait assistance: Contact guard assist Gait Distance (Feet): 80 Feet Assistive device: Rolling walker (2 wheels) Gait Pattern/deviations: Step-through pattern, Decreased stride length   Gait velocity interpretation: 1.31 - 2.62 ft/sec, indicative of limited community ambulator   General Gait Details: cues for posture and looking up with pt able to increased distance with seated rest and walked 80'x 2 trials on RA with SPO2 90%   Stairs             Wheelchair Mobility     Tilt Bed     Modified Rankin (Stroke Patients Only)       Balance Overall balance assessment: Mild deficits observed, not formally tested                                          Cognition Arousal: Alert Behavior During Therapy: WFL for tasks assessed/performed Overall Cognitive Status: Within Functional Limits for tasks assessed                                 General Comments: pt recalling 3/4 precautions        Exercises General Exercises - Lower Extremity Long Arc Quad: AROM, Both, Seated, 10 reps    General Comments        Pertinent Vitals/Pain Pain Assessment Pain Score: 3  Pain Location: CT site Pain Descriptors / Indicators: Sore, Aching Pain Intervention(s): Limited activity within patient's tolerance, Repositioned, Monitored during session    Home Living                          Prior Function            PT Goals (current goals can now be found in the care plan section) Progress towards PT goals: Progressing toward goals    Frequency    Min 1X/week  PT Plan      Co-evaluation              AM-PAC PT 6 Clicks Mobility   Outcome Measure  Help needed turning from your back to your side while in a flat bed without using bedrails?: A Little Help needed moving from lying on your back to sitting on the side of a flat bed without using bedrails?: A Little Help needed moving to and from a bed to a chair (including a wheelchair)?: A Little Help needed standing up from a chair using your arms (e.g., wheelchair or bedside chair)?: A Little Help needed to walk in hospital room?: A Little Help needed climbing 3-5 steps with a railing? : A Lot 6 Click Score: 17    End of Session   Activity Tolerance: Patient tolerated treatment well Patient left: in chair;with call bell/phone within reach;with chair alarm set Nurse Communication: Mobility status;Precautions PT Visit Diagnosis: Unsteadiness on feet  (R26.81);Other abnormalities of gait and mobility (R26.89);Pain     Time: 9245-9176 PT Time Calculation (min) (ACUTE ONLY): 29 min  Charges:    $Gait Training: 8-22 mins $Therapeutic Activity: 8-22 mins PT General Charges $$ ACUTE PT VISIT: 1 Visit                     Lenoard SQUIBB, PT Acute Rehabilitation Services Office: [phone]    Lenoard NOVAK Ndidi Nesby 10/16/2023, 8:58 AM

## 2023-10-16 NOTE — Progress Notes (Signed)
 Occupational Therapy Treatment Patient Details Name: Megan Pham MRN: [id number] DOB: November 27, 1945 Today's Date: 10/16/2023   History of present illness 78 y/o female admitted 1/27 for CABG x 4. 1/30 Lt PTX with chest tube placed. R thoracentesis 2/5. PMH: CAD, HTN, OA, HLD   OT comments  Pt making continued progress towards OT goals. Pt reports breathing much improved s/p thoracentesis though some DOE still noted at end of session. Pt able to complete transfers and bathroom mobility using RW with CGA and stand at sink for basic UB ADLs with Supervision. Pt continues to require some assist for LB ADLs and minor cues needed for sternal precautions. Will continue to follow acutely. Noted RW delivered to room though pt reports difficulty using RW and requesting Rollator to increase ease of mobility instead.       If plan is discharge home, recommend the following:  A little help with walking and/or transfers;A little help with bathing/dressing/bathroom;Assistance with cooking/housework   Equipment Recommendations  BSC/3in1;Other (comment) Aeronautical Engineer)    Recommendations for Other Services      Precautions / Restrictions Precautions Precautions: Fall;Sternal;Other (comment) Precaution Booklet Issued: Yes (comment) Precaution Comments: chest tube, watch sats Restrictions Weight Bearing Restrictions Per Provider Order: No       Mobility Bed Mobility               General bed mobility comments: in chair on entry    Transfers Overall transfer level: Needs assistance Equipment used: Rolling walker (2 wheels) Transfers: Sit to/from Stand Sit to Stand: Contact guard assist           General transfer comment: increased effort to stand from lower regular toilet but successful on second attempt. Increased ease from slightly higher surface     Balance Overall balance assessment: Mild deficits observed, not formally tested                                          ADL either performed or assessed with clinical judgement   ADL Overall ADL's : Needs assistance/impaired     Grooming: Supervision/safety;Standing;Wash/dry face;Oral care Grooming Details (indicate cue type and reason): standing at sink                 Toilet Transfer: Contact guard assist;Ambulation;Rolling walker (2 wheels);Regular Teacher, Adult Education Details (indicate cue type and reason): CGA to/from bathroom with RW. Toileting- Clothing Manipulation and Hygiene: Minimal assistance;Sitting/lateral lean;Sit to/from stand Toileting - Clothing Manipulation Details (indicate cue type and reason): assist for mgmt of gown around back. able to perform anterior peri care after urination on toilet today     Functional mobility during ADLs: Contact guard assist;Rolling walker (2 wheels) General ADL Comments: Pt interested in trying Rollator for energy conservation; wants to switch RW out for Rollator that was delivered to room    Extremity/Trunk Assessment Upper Extremity Assessment Upper Extremity Assessment: Overall WFL for tasks assessed;Right hand dominant   Lower Extremity Assessment Lower Extremity Assessment: Defer to PT evaluation        Vision   Vision Assessment?: No apparent visual deficits   Perception     Praxis      Cognition Arousal: Alert Behavior During Therapy: WFL for tasks assessed/performed Overall Cognitive Status: Within Functional Limits for tasks assessed  Exercises      Shoulder Instructions       General Comments VSS on RA    Pertinent Vitals/ Pain       Pain Assessment Pain Assessment: No/denies pain  Home Living                                          Prior Functioning/Environment              Frequency  Min 1X/week        Progress Toward Goals  OT Goals(current goals can now be found in the care plan section)  Progress towards OT  goals: Progressing toward goals  Acute Rehab OT Goals Patient Stated Goal: get chest tube out OT Goal Formulation: With patient Time For Goal Achievement: 10/24/23 Potential to Achieve Goals: Good ADL Goals Pt Will Perform Lower Body Bathing: with set-up;sit to/from stand;sitting/lateral leans Pt Will Perform Lower Body Dressing: with set-up;sitting/lateral leans;sit to/from stand Pt Will Transfer to Toilet: with contact guard assist;ambulating  Plan      Co-evaluation                 AM-PAC OT 6 Clicks Daily Activity     Outcome Measure   Help from another person eating meals?: None Help from another person taking care of personal grooming?: A Little Help from another person toileting, which includes using toliet, bedpan, or urinal?: A Little Help from another person bathing (including washing, rinsing, drying)?: A Lot Help from another person to put on and taking off regular upper body clothing?: A Little Help from another person to put on and taking off regular lower body clothing?: A Lot 6 Click Score: 17    End of Session Equipment Utilized During Treatment: Rolling walker (2 wheels)  OT Visit Diagnosis: Unsteadiness on feet (R26.81);Other abnormalities of gait and mobility (R26.89);Muscle weakness (generalized) (M62.81)   Activity Tolerance Patient tolerated treatment well   Patient Left in chair;with call bell/phone within reach;Other (comment);with chair alarm set (skin check RNs in room; confirmed they can finish setting pt up when they are done)   Nurse Communication          Time: [id number] OT Time Calculation (min): 23 min  Charges: OT General Charges $OT Visit: 1 Visit OT Treatments $Self Care/Home Management : 8-22 mins  Mliss NOVAK, OTR/L Acute Rehab Services Office: [phone]   Mliss Getting 10/16/2023, 10:08 AM

## 2023-10-17 ENCOUNTER — Inpatient Hospital Stay (HOSPITAL_COMMUNITY): Payer: 59

## 2023-10-17 NOTE — Progress Notes (Signed)
 Mobility Specialist Progress Note:    10/17/23 1428  Mobility  Activity Ambulated with assistance in hallway  Level of Assistance Standby assist, set-up cues, supervision of patient - no hands on  Assistive Device Four wheel walker  Distance Ambulated (ft) 150 ft  Activity Response Tolerated well  Mobility Referral Yes  Mobility visit 1 Mobility  Mobility Specialist Start Time (ACUTE ONLY) 1415  Mobility Specialist Stop Time (ACUTE ONLY) 1428  Mobility Specialist Time Calculation (min) (ACUTE ONLY) 13 min   Pt received in chair, agreeable to mobility session. Ambulated in hallway with 4WW and SV. HR 71 bpm during mobility. Tolerated well, took 1 seated rest break d/t fatigue, recovered. Returned to room, left with all needs met, sitting up comfortably in chair.    Megan Pham Mobility Specialist Please contact via Special Educational Needs Teacher or  Rehab office at [phone]

## 2023-10-17 NOTE — Progress Notes (Addendum)
      [address]       [place] [address]             [phone]        11 Days Post-Op Procedure(s) (LRB): CORONARY ARTERY BYPASS GRAFTING (CABG) TIMES FOUR USING LEFT INTERNAL MAMMARY ARTERY AND BILATERALLY ENDOSCOPICALLY HARVESTED GREATER SAPHENOUS VEINS (N/A) TRANSESOPHAGEAL ECHOCARDIOGRAM (TEE) (N/A)  Subjective: Patient ordering breakfast this am. She has no complaints.  Objective: Vital signs in last 24 hours: Temp:  [97.7 F (36.5 C)-99.1 F (37.3 C)] 97.7 F (36.5 C) (02/07 0300) Pulse Rate:  [60-71] 67 (02/07 0300) Cardiac Rhythm: Normal sinus rhythm (02/06 1902) Resp:  [15-25] 20 (02/07 0300) BP: (96-138)/(39-68) 113/68 (02/07 0300) SpO2:  [95 %-99 %] 95 % (02/07 0300) Weight:  [76.8 kg] 76.8 kg (02/07 0300)  Pre op weight 77.2 kg Current Weight  10/17/23 76.8 kg      Intake/Output from previous day: 02/06 0701 - 02/07 0700 In: 1320 [P.O.:1320] Out: 1 [Urine:1]   Physical Exam:  Cardiovascular: RRR Pulmonary: Slightly diminished bibasilar breath sounds,decreased subcutaneous emphysema left anterior/lateral chest wall and neck  Abdomen: Soft, non tender, bowel sounds present. Extremities: Decreasing bilateral lower extremity edema. Wounds: Sternal and bilateral LE wounds are clean and dry.  No erythema or signs of infection. Left chest tube: to water seal, no air leak  Lab Results: CBC: No results for input(s): WBC, HGB, HCT, PLT in the last 72 hours.  BMET:  Recent Labs    10/15/23 0329  NA 135  K 3.5  CL 100  CO2 25  GLUCOSE 99  BUN 15  CREATININE 0.81  CALCIUM  8.2*    PT/INR:  Lab Results  Component Value Date   INR 1.6 (H) 10/06/2023   INR 1.0 10/02/2023   ABG:  INR: Will add last result for INR, ABG once components are confirmed Will add last 4 CBG results once components are confirmed  Assessment/Plan: 1. CV - SR. On Toprol  XL 25 mg daily 2.  Pulmonary - S/p right thoracentesis 2/5;350 cc  removed. On room air this am.. It now appears she will not need oxygen at discharge.  She may have undiagnosed COPD. Continue nebs. Left chest tube placed for left pneumothorax 01/30. Chest tube output shows none last 24 hours. Chest tube is to water seal. CXR this am appears to shows no pneumothorax, minor subcutaneous emphysema left lateral chest wall, and small bilateral pleural effusion/atelectasis. As discussed with Dr. Shyrl, will remove chest tube today. Mucinex  bid. Encourage incentive spirometer and flutter valve. 3. Above pre op weight, requires further diuresis-continue Lasix  40 mg orally 4.  Expected post op acute blood loss anemia - Last H and H stable at 9.1 and 27.3 5. On Lovenox  for DVT prophylaxis 6. She is very deconditioned-continue with PT/OT 7. Disposition-Getting closer discharge. When ready, she will go home with friend as she does not want SNF  Donielle M ZimmermanPA-C 6:59 AM  Agree with above Dispo planning  Jamani Bearce O Shaunta Oncale

## 2023-10-17 NOTE — Progress Notes (Signed)
 CARDIAC REHAB PHASE I     Post OHS education including site care, restrictions, heart healthy diet, sternal precautions, IS use at home, home needs at discharge, exercise guidelines and CRP2 reviewed. All questions and concerns addressed. Will refer to [facility] for CRP2.   1015-1050 Vaughn Asberry Hacking, RN BSN 10/17/2023 10:49 AM

## 2023-10-17 NOTE — TOC Progression Note (Signed)
 Transition of Care (TOC) - Progression Note  Rayfield Gobble RN, BSN Transitions of Care Unit 4E- RN Case Manager See Treatment Team for direct phone #   Patient Details  Name: Megan Pham MRN: [id number] Date of Birth: 10-07-1945  Transition of Care [facility]) CM/SW Contact  Gobble, Rayfield Hurst, RN Phone Number: 10/17/2023, 4:25 PM  Clinical Narrative:    CM spoke with pt at bedside along with friend June that she will be going to stay with.  Discussed HH again- pt still voicing she does not know what she wants to do as far as [facility] and wants to review list after she gets home- explained to pt that she needs to review list prior to discharge as TOC needs to call and make sure [facility] agency of choice can accept insurance and has available staff for services. Pt voiced understanding and states she and June will look at list today. CM will have weekend CM follow up to confirm [facility] choice.  [facility] liaison will also follow up to answer any questions.   Address confirmed with June as: [address], [place] [address]  DME at bedside for home   Expected Discharge Plan: Home w Home Health Services Barriers to Discharge: Continued Medical Work up  Expected Discharge Plan and Services In-house Referral: Clinical Social Work Discharge Planning Services: CM Consult Post Acute Care Choice: Durable Medical Equipment, Home Health Living arrangements for the past 2 months: Single Family Home                 DME Arranged: Bedside commode, Walker rolling with seat, Walker rolling DME Agency: [employer] Date DME Agency Contacted: 10/15/23 Time DME Agency Contacted: 1500 Representative spoke with at DME Agency: [facility] HH Arranged: RN, PT, OT HH Agency: [facility] ([facility])     Representative spoke with at [facility] Agency: [facility]   Social Determinants of Health (SDOH) Interventions SDOH Screenings   Food Insecurity: No Food Insecurity (10/07/2023)  Housing: Low Risk   (10/07/2023)  Transportation Needs: No Transportation Needs (10/07/2023)  Utilities: Not At Risk (10/07/2023)  Depression (PHQ2-9): Low Risk  (06/11/2022)  Financial Resource Strain: Low Risk  (04/08/2023)   Received from [facility]  Physical Activity: Sufficiently Active (04/08/2023)   Received from [facility]  Social Connections: Moderately Isolated (10/07/2023)  Stress: No Stress Concern Present (04/08/2023)   Received from [facility]  Tobacco Use: Medium Risk (10/06/2023)    Readmission Risk Interventions     No data to display

## 2023-10-17 NOTE — Plan of Care (Signed)
   Problem: Education: Goal: Knowledge of General Education information will improve Description: Including pain rating scale, medication(s)/side effects and non-pharmacologic comfort measures Outcome: Progressing   Problem: Activity: Goal: Risk for activity intolerance will decrease Outcome: Progressing   Problem: Nutrition: Goal: Adequate nutrition will be maintained Outcome: Progressing   Problem: Coping: Goal: Level of anxiety will decrease Outcome: Progressing

## 2023-10-17 NOTE — Plan of Care (Signed)
   Problem: Clinical Measurements: Goal: Will remain free from infection Outcome: Adequate for Discharge

## 2023-10-18 ENCOUNTER — Other Ambulatory Visit (HOSPITAL_COMMUNITY): Payer: Self-pay

## 2023-10-18 ENCOUNTER — Inpatient Hospital Stay (HOSPITAL_COMMUNITY): Payer: 59

## 2023-10-18 MED ORDER — POTASSIUM CHLORIDE CRYS ER 20 MEQ PO TBCR: 20.0000 meq | EXTENDED_RELEASE_TABLET | Freq: Every day | ORAL | 0 refills | Status: DC

## 2023-10-18 MED ORDER — METOPROLOL SUCCINATE ER 25 MG PO TB24: 25.0000 mg | ORAL_TABLET | Freq: Every day | ORAL | Status: DC

## 2023-10-18 MED ORDER — METOPROLOL SUCCINATE ER 25 MG PO TB24
25.0000 mg | ORAL_TABLET | Freq: Every day | ORAL | 1 refills | Status: DC
  Filled 2023-10-18: qty 30, 30d supply, fill #0

## 2023-10-18 MED ORDER — ASPIRIN 325 MG PO TBEC: 325.0000 mg | DELAYED_RELEASE_TABLET | Freq: Every day | ORAL | Status: DC

## 2023-10-18 MED ORDER — TRAMADOL HCL 50 MG PO TABS
50.0000 mg | ORAL_TABLET | Freq: Four times a day (QID) | ORAL | 0 refills | Status: DC | PRN
  Filled 2023-10-18: qty 28, 7d supply, fill #0

## 2023-10-18 MED ORDER — FUROSEMIDE 40 MG PO TABS: 40.0000 mg | ORAL_TABLET | Freq: Every day | ORAL | 0 refills | Status: DC

## 2023-10-18 MED ORDER — METOPROLOL SUCCINATE ER 25 MG PO TB24: 25.0000 mg | ORAL_TABLET | Freq: Every day | ORAL | 1 refills | Status: DC

## 2023-10-18 MED ORDER — AMIODARONE HCL 200 MG PO TABS: 200.0000 mg | ORAL_TABLET | Freq: Every day | ORAL | 1 refills | Status: DC

## 2023-10-18 MED ORDER — POTASSIUM CHLORIDE CRYS ER 20 MEQ PO TBCR
20.0000 meq | EXTENDED_RELEASE_TABLET | Freq: Every day | ORAL | 0 refills | Status: DC
  Filled 2023-10-18: qty 4, 4d supply, fill #0

## 2023-10-18 MED ORDER — AMIODARONE HCL 200 MG PO TABS
200.0000 mg | ORAL_TABLET | Freq: Every day | ORAL | 1 refills | Status: DC
  Filled 2023-10-18: qty 30, 30d supply, fill #0

## 2023-10-18 MED ORDER — FUROSEMIDE 40 MG PO TABS
40.0000 mg | ORAL_TABLET | Freq: Every day | ORAL | 0 refills | Status: DC
  Filled 2023-10-18: qty 4, 4d supply, fill #0

## 2023-10-18 MED ORDER — TRAMADOL HCL 50 MG PO TABS: 50.0000 mg | ORAL_TABLET | Freq: Four times a day (QID) | ORAL | 0 refills | Status: AC | PRN

## 2023-10-18 NOTE — Plan of Care (Signed)

## 2023-10-18 NOTE — Progress Notes (Addendum)
      [address]       [place] [address]             [phone]        12 Days Post-Op Procedure(s) (LRB): CORONARY ARTERY BYPASS GRAFTING (CABG) TIMES FOUR USING LEFT INTERNAL MAMMARY ARTERY AND BILATERALLY ENDOSCOPICALLY HARVESTED GREATER SAPHENOUS VEINS (N/A) TRANSESOPHAGEAL ECHOCARDIOGRAM (TEE) (N/A)  Subjective: Patient eating breakfast this am. She has no complaints. She states she is walking numerous times in her room and walked most of the hall way yesterday.  Objective: Vital signs in last 24 hours: Temp:  [97.5 F (36.4 C)-98.4 F (36.9 C)] 98.2 F (36.8 C) (02/08 0413) Pulse Rate:  [59-70] 59 (02/08 0413) Cardiac Rhythm: Normal sinus rhythm (02/08 0716) Resp:  [18-20] 18 (02/08 0413) BP: (115-129)/(38-87) 115/40 (02/08 0413) SpO2:  [96 %-98 %] 96 % (02/08 0413) Weight:  [76.3 kg] 76.3 kg (02/08 0615)  Pre op weight 77.2 kg Current Weight  10/18/23 76.3 kg       Intake/Output from previous day: 02/07 0701 - 02/08 0700 In: 720 [P.O.:720] Out: 0    Physical Exam:  Cardiovascular: RRR Pulmonary: Clear to auscultation bilaterally Abdomen: Soft, non tender, bowel sounds present. Extremities:Trace bilateral lower extremity edema. Wounds: Clean and dry.  No erythema or signs of infection.  Lab Results: CBC:No results for input(s): WBC, HGB, HCT, PLT in the last 72 hours. BMET: No results for input(s): NA, K, CL, CO2, GLUCOSE, BUN, CREATININE, CALCIUM  in the last 72 hours.  PT/INR:  Lab Results  Component Value Date   INR 1.6 (H) 10/06/2023   INR 1.0 10/02/2023   ABG:  INR: Will add last result for INR, ABG once components are confirmed Will add last 4 CBG results once components are confirmed  Assessment/Plan: 1. CV - SR. On Toprol  XL 25 mg daily 2.  Pulmonary - S/p right thoracentesis 2/5;350 cc removed. On room air this am. She may have undiagnosed COPD;continue nebs. Left chest tube removed  yesterday. CXR this am appears to shows no pneumothorax, trace subcutaneous emphysema left lateral chest wall, and small bilateral pleural effusion/atelectasis.  Mucinex  bid. Encourage incentive spirometer and flutter valve. 3. She is close to  pre op weight but requires further diuresis-continue Lasix  40 mg orally 4.  Expected post op acute blood loss anemia - Last H and H stable at 9.1 and 27.3 5. On Lovenox  for DVT prophylaxis 6. She is very deconditioned-continue with PT/OT.  7. Disposition-She will go home with friend (which arrangements are already made) as she does not want SNF  Maelee Hoot M ZimmermanPA-C 7:29 AM

## 2023-10-18 NOTE — Progress Notes (Signed)
 Mobility Specialist Progress Note:   10/18/23 0915  Mobility  Activity Ambulated with assistance in hallway;Ambulated with assistance in room;Ambulated with assistance to bathroom  Level of Assistance Standby assist, set-up cues, supervision of patient - no hands on  Assistive Device Four wheel walker  Distance Ambulated (ft) 180 ft  RUE Weight Bearing Per Provider Order NWB  LUE Weight Bearing Per Provider Order NWB  Activity Response Tolerated well  Mobility Referral Yes  Mobility visit 1 Mobility  Mobility Specialist Start Time (ACUTE ONLY) 0915  Mobility Specialist Stop Time (ACUTE ONLY) 0925  Mobility Specialist Time Calculation (min) (ACUTE ONLY) 10 min   Pt received in bed, eager for mobility session. Ambulated to bathroom without AD, sternal precautions followed. Agreeable to ambulate in hallway using 4WW and SV. Tolerated well, no rest breaks during session. Returned pt back to room, left with all needs met.   Astrid Vides Mobility Specialist Please contact via Special Educational Needs Teacher or  Rehab office at [phone]

## 2023-10-18 NOTE — Progress Notes (Signed)
 Patient being discharged home.  Patient to be transported by her friend.  IV removed with the catheter intact.  Discharge instructions and prescription information given to the patient who verbalized understanding.  Patient discharged with rollator walker and bed side commode.

## 2023-10-18 NOTE — Plan of Care (Signed)
 Problem: Education: Goal: Knowledge of General Education information will improve Description: Including pain rating scale, medication(s)/side effects and non-pharmacologic comfort measures 10/18/2023 0726 by Les Delon CROME, RN Outcome: Adequate for Discharge 10/18/2023 0726 by Les Delon CROME, RN Outcome: Progressing   Problem: Health Behavior/Discharge Planning: Goal: Ability to manage health-related needs will improve Outcome: Adequate for Discharge   Problem: Clinical Measurements: Goal: Ability to maintain clinical measurements within normal limits will improve Outcome: Adequate for Discharge Goal: Will remain free from infection Outcome: Adequate for Discharge Goal: Diagnostic test results will improve Outcome: Adequate for Discharge Goal: Cardiovascular complication will be avoided Outcome: Adequate for Discharge   Problem: Activity: Goal: Risk for activity intolerance will decrease 10/18/2023 0726 by Les Delon CROME, RN Outcome: Adequate for Discharge 10/18/2023 0726 by Les Delon CROME, RN Outcome: Progressing   Problem: Nutrition: Goal: Adequate nutrition will be maintained 10/18/2023 0726 by Les Delon CROME, RN Outcome: Adequate for Discharge 10/18/2023 0726 by Les Delon CROME, RN Outcome: Progressing   Problem: Coping: Goal: Level of anxiety will decrease 10/18/2023 0726 by Les Delon CROME, RN Outcome: Adequate for Discharge 10/18/2023 0726 by Les Delon CROME, RN Outcome: Progressing   Problem: Elimination: Goal: Will not experience complications related to bowel motility Outcome: Adequate for Discharge Goal: Will not experience complications related to urinary retention Outcome: Adequate for Discharge   Problem: Pain Managment: Goal: General experience of comfort will improve and/or be controlled 10/18/2023 0726 by Les Delon CROME, RN Outcome: Adequate for Discharge 10/18/2023 0726 by Les Delon CROME, RN Outcome: Progressing   Problem: Safety: Goal: Ability to remain free from injury will improve Outcome: Adequate for Discharge   Problem: Skin Integrity: Goal: Risk for impaired skin integrity will decrease Outcome: Adequate for Discharge   Problem: Education: Goal: Will demonstrate proper wound care and an understanding of methods to prevent future damage Outcome: Adequate for Discharge Goal: Knowledge of disease or condition will improve Outcome: Adequate for Discharge Goal: Knowledge of the prescribed therapeutic regimen will improve Outcome: Adequate for Discharge   Problem: Activity: Goal: Risk for activity intolerance will decrease Outcome: Adequate for Discharge   Problem: Cardiac: Goal: Will achieve and/or maintain hemodynamic stability Outcome: Adequate for Discharge   Problem: Clinical Measurements: Goal: Postoperative complications will be avoided or minimized Outcome: Adequate for Discharge   Problem: Respiratory: Goal: Respiratory status will improve Outcome: Adequate for Discharge   Problem: Skin Integrity: Goal: Wound healing without signs and symptoms of infection Outcome: Adequate for Discharge Goal: Risk for impaired skin integrity will decrease Outcome: Adequate for Discharge   Problem: Education: Goal: Ability to describe self-care measures that may prevent or decrease complications (Diabetes Survival Skills Education) will improve Outcome: Adequate for Discharge   Problem: Coping: Goal: Ability to adjust to condition or change in health will improve Outcome: Adequate for Discharge   Problem: Fluid Volume: Goal: Ability to maintain a balanced intake and output will improve Outcome: Adequate for Discharge   Problem: Metabolic: Goal: Ability to maintain appropriate glucose levels will improve Outcome: Adequate for Discharge   Problem: Nutritional: Goal: Maintenance of adequate nutrition will improve Outcome: Adequate for  Discharge Goal: Progress toward achieving an optimal weight will improve Outcome: Adequate for Discharge   Problem: Skin Integrity: Goal: Risk for impaired skin integrity will decrease Outcome: Adequate for Discharge   Problem: Tissue Perfusion: Goal: Adequacy of tissue perfusion will improve Outcome: Adequate for Discharge   Problem: Acute Rehab OT Goals (only OT should resolve) Goal: Pt. Will Perform Lower Body  Bathing Outcome: Adequate for Discharge Goal: Pt. Will Perform Lower Body Dressing Outcome: Adequate for Discharge Goal: Pt. Will Transfer To Toilet Outcome: Adequate for Discharge   Problem: Acute Rehab PT Goals(only PT should resolve) Goal: Pt Will Go Supine/Side To Sit Outcome: Adequate for Discharge Goal: Pt Will Go Sit To Supine/Side Outcome: Adequate for Discharge Goal: Pt Will Transfer Bed To Chair/Chair To Bed Outcome: Adequate for Discharge Goal: Pt Will Ambulate Outcome: Adequate for Discharge

## 2023-10-18 NOTE — Progress Notes (Signed)
 Patient discharged with prescriptions filled at [facility].

## 2023-10-21 ENCOUNTER — Other Ambulatory Visit: Payer: Self-pay

## 2023-10-21 DIAGNOSIS — I6523 Occlusion and stenosis of bilateral carotid arteries: Secondary | ICD-10-CM

## 2023-10-22 DIAGNOSIS — E7800 Pure hypercholesterolemia, unspecified: Secondary | ICD-10-CM | POA: Insufficient documentation

## 2023-10-22 DIAGNOSIS — I779 Disorder of arteries and arterioles, unspecified: Secondary | ICD-10-CM | POA: Insufficient documentation

## 2023-10-22 NOTE — Progress Notes (Unsigned)
Cardiology Office Note:    Date:  10/24/2023  ID:  Megan Pham, DOB 1946-05-17, MRN [id number] PCP: Avel Peace Chales Salmon, MD  [facility] Providers Cardiologist:  Tessa Lerner, DO       Patient Profile:      Coronary artery disease S/p CABG 09/2023 c/b post op atrial fibrillation, pneumothorax s/p chest tube, effusion s/p R thoracentesis TTE 07/31/23: EF 60-65, no RWMA, Gr 1 DD, NL RVSF, AV sclerosis, borderline ascending aorta dilation 39 mm, RAP 3 Hypertension  Hyperlipidemia  Carotid artery disease [place] 09/18/23: R 1-39; L 40-59         Megan Pham is a 78 y.o. female who returns for post hospital follow up.  She was evaluated in October 2020 for shortness of breath.  Coronary CTA demonstrated high calcium score and possible significant CAD.  Cardiac catheterization was arranged and demonstrated three-vessel CAD.  She was admitted 1/27-2/8 and underwent CABG (L-LAD, S-PDA, S-OM1, S-OM2).  Postop course was complicated by left pneumothorax with subcutaneous emphysema requiring chest tube placement, pleural effusion s/p right thoracentesis, atrial fibrillation.  Chest tube was ultimately removed 2/7.  She was placed on amiodarone and converted to sinus rhythm.  She was kept on furosemide for expected postoperative volume excess.  She has a telephone visit later today with Dr. Cliffton Asters.  She is here alone.  She remains sore.  She feels that she is progressing well.  Her appetite is somewhat blunted.  She has had some nausea.  She has not had any fevers.  Her bowel movements are good.  She does note shortness of breath with some activities.  She has not had orthopnea.  Her incisions are healing well.  She does have some ankle edema that resolves with elevation.  ROS-See HPI    Studies Reviewed:   EKG Interpretation Date/Time:  Friday October 24 2023 08:47:51 EST Ventricular Rate:  70 PR Interval:  168 QRS Duration:  86 QT Interval:  320 QTC Calculation: 345 R  Axis:   -5  Text Interpretation: Sinus rhythm with occasional Premature ventricular complexes Nonspecific T wave abnormality Confirmed by Tereso Newcomer [phone]) on 10/24/2023 8:50:49 AM     Risk Assessment/Calculations:             Physical Exam:   VS:  BP (!) 120/58 (BP Location: Left Arm, Patient Position: Sitting, Cuff Size: Large)   Pulse 73   Resp 15   Ht 5' (1.524 m)   Wt 167 lb (75.8 kg)   SpO2 97%   BMI 32.61 kg/m    Wt Readings from Last 3 Encounters:  10/24/23 167 lb (75.8 kg)  10/18/23 168 lb 3.4 oz (76.3 kg)  10/02/23 170 lb 4.8 oz (77.2 kg)    Constitutional:      Appearance: Healthy appearance. Not in distress.  Neck:     Vascular: No JVR.  Pulmonary:     Breath sounds: Normal breath sounds. No wheezing. No rales.  Chest:     Comments: Median sternotomy well-healed without erythema or discharge Cardiovascular:     Normal rate. Regular rhythm.     Murmurs: There is a grade 1/6 systolic murmur at the URSB.  Edema:    Peripheral edema present.    Ankle: bilateral trace edema of the ankle. Abdominal:     Palpations: Abdomen is soft.        Assessment and Plan:   Assessment & Plan Coronary artery disease involving native coronary artery of native heart without  angina pectoris S/p CABG. Her post op course was c/b atrial fibrillation, pneumothorax requiring chest tube placement and effusion requiring R thoracentesis.  She remains sore but is progressing well.  She is walking is much as possible.  I have recommended pursuing cardiac rehab once she is cleared by cardiac surgery and has completed home health physical therapy.  We discussed the importance of aspirin 325 mg for 90 days post bypass.  She had cut back to 81 because of a history of peptic ulcer disease which occurred in the 1970s.  She has completed furosemide and potassium.  Her volume appears stable.  She still has some residual ankle swelling but this seems to be due to venous insufficiency and resolves  with elevation.  She has a follow-up phone visit later today with Dr. Cliffton Asters. -Increase ASA to 325 mg daily x 3 months, then reduce to 81 mg daily -Take omeprazole 20 mg OTC daily x 3 months for gastric protection -Continue Crestor 40 mg daily -Keep follow-up with Dr. Odis Hollingshead next month. Postoperative atrial fibrillation (HCC) Maintaining NSR on Amiodarone.  She notes some nausea and decreased appetite.   -Decrease amiodarone to 100 mg once daily -Plan stopping amiodarone 90 days after surgery -If nausea continues may need to stop amiodarone sooner -Continue metoprolol succinate 25 mg daily Essential hypertension Blood pressure controlled. -Continue losartan 25 mg daily -Continue amlodipine 5 mg daily -Continue metoprolol succinate 25 mg daily Bilateral carotid artery stenosis She will need repeat Carotid US in 1 year.  Pure hypercholesterolemia Continue rosuvastatin 40 mg daily.  Arrange fasting CMET, lipids in 8 weeks. PVC's (premature ventricular contractions) She recently completed Lasix.  Obtain BMET to rule out hypokalemia.     Cardiac Rehabilitation Eligibility Assessment  The patient is NOT ready to start cardiac rehabilitation due to: The patient is currently in a SNF or HHPT program.     Dispo:  Return in about 24 days (around 11/17/2023) for Scheduled Follow Up, w/ Dr. Odis Hollingshead.  Signed, Tereso Newcomer, PA-C

## 2023-10-24 ENCOUNTER — Other Ambulatory Visit: Payer: Self-pay | Admitting: *Deleted

## 2023-10-24 ENCOUNTER — Encounter: Payer: Self-pay | Admitting: Physician Assistant

## 2023-10-24 ENCOUNTER — Ambulatory Visit: Payer: 59 | Attending: Physician Assistant | Admitting: Physician Assistant

## 2023-10-24 ENCOUNTER — Ambulatory Visit (INDEPENDENT_AMBULATORY_CARE_PROVIDER_SITE_OTHER): Payer: Self-pay | Admitting: Thoracic Surgery (Cardiothoracic Vascular Surgery)

## 2023-10-24 ENCOUNTER — Other Ambulatory Visit: Payer: Self-pay | Admitting: Physician Assistant

## 2023-10-24 VITALS — BP 120/58 | HR 73 | Resp 15 | Ht 60.0 in | Wt 167.0 lb

## 2023-10-24 DIAGNOSIS — I10 Essential (primary) hypertension: Secondary | ICD-10-CM

## 2023-10-24 DIAGNOSIS — I6523 Occlusion and stenosis of bilateral carotid arteries: Secondary | ICD-10-CM

## 2023-10-24 DIAGNOSIS — I4891 Unspecified atrial fibrillation: Secondary | ICD-10-CM

## 2023-10-24 DIAGNOSIS — I9789 Other postprocedural complications and disorders of the circulatory system, not elsewhere classified: Secondary | ICD-10-CM

## 2023-10-24 DIAGNOSIS — E7800 Pure hypercholesterolemia, unspecified: Secondary | ICD-10-CM

## 2023-10-24 DIAGNOSIS — I2510 Atherosclerotic heart disease of native coronary artery without angina pectoris: Secondary | ICD-10-CM

## 2023-10-24 DIAGNOSIS — Z951 Presence of aortocoronary bypass graft: Secondary | ICD-10-CM

## 2023-10-24 DIAGNOSIS — I493 Ventricular premature depolarization: Secondary | ICD-10-CM

## 2023-10-24 MED ORDER — OMEPRAZOLE MAGNESIUM 20 MG PO TBEC: 20.0000 mg | DELAYED_RELEASE_TABLET | Freq: Every day | ORAL | Status: DC

## 2023-10-24 MED ORDER — ASPIRIN 325 MG PO TBEC: 325.0000 mg | DELAYED_RELEASE_TABLET | Freq: Every day | ORAL | Status: DC

## 2023-10-24 MED ORDER — AMIODARONE HCL 100 MG PO TABS: 100.0000 mg | ORAL_TABLET | Freq: Every day | ORAL | 3 refills | Status: DC

## 2023-10-24 NOTE — Progress Notes (Signed)
     [address]       [place] [address]             [phone]       Patient: Home Provider: Office Consent for Telemedicine visit obtained.  Today's visit was completed via a real-time telehealth (see specific modality noted below). The patient/authorized person provided oral consent at the time of the visit to engage in a telemedicine encounter with the present provider at [facility]. The patient/authorized person was informed of the potential benefits, limitations, and risks of telemedicine. The patient/authorized person expressed understanding that the laws that protect confidentiality also apply to telemedicine. The patient/authorized person acknowledged understanding that telemedicine does not provide emergency services and that he or she would need to call 911 or proceed to the nearest hospital for help if such a need arose.   Total time spent in the clinical discussion 10 minutes.  Telehealth Modality: Phone visit (audio only)  I had a telephone visit with  [name] who is s/p CABG.  Overall doing well.  Pain is minimal.  Ambulating well. Vitals have been stable.  Wynelle Fanny Grobe will see [place] back in 1 month with a chest x-ray for cardiac rehab clearance.  Tex Conroy Keane Scrape

## 2023-10-24 NOTE — Assessment & Plan Note (Signed)
Blood pressure controlled. -Continue losartan 25 mg daily -Continue amlodipine 5 mg daily -Continue metoprolol succinate 25 mg daily

## 2023-10-24 NOTE — Assessment & Plan Note (Signed)
Continue rosuvastatin 40 mg daily.  Arrange fasting CMET, lipids in 8 weeks.

## 2023-10-24 NOTE — Assessment & Plan Note (Signed)
S/p CABG. Her post op course was c/b atrial fibrillation, pneumothorax requiring chest tube placement and effusion requiring R thoracentesis.  She remains sore but is progressing well.  She is walking is much as possible.  I have recommended pursuing cardiac rehab once she is cleared by cardiac surgery and has completed home health physical therapy.  We discussed the importance of aspirin 325 mg for 90 days post bypass.  She had cut back to 81 because of a history of peptic ulcer disease which occurred in the 1970s.  She has completed furosemide and potassium.  Her volume appears stable.  She still has some residual ankle swelling but this seems to be due to venous insufficiency and resolves with elevation.  She has a follow-up phone visit later today with Dr. Cliffton Asters. -Increase ASA to 325 mg daily x 3 months, then reduce to 81 mg daily -Take omeprazole 20 mg OTC daily x 3 months for gastric protection -Continue Crestor 40 mg daily -Keep follow-up with Dr. Odis Hollingshead next month.

## 2023-10-24 NOTE — Assessment & Plan Note (Signed)
She will need repeat Carotid US in 1 year.

## 2023-10-24 NOTE — Assessment & Plan Note (Signed)
Maintaining NSR on Amiodarone.  She notes some nausea and decreased appetite.   -Decrease amiodarone to 100 mg once daily -Plan stopping amiodarone 90 days after surgery -If nausea continues may need to stop amiodarone sooner -Continue metoprolol succinate 25 mg daily

## 2023-10-24 NOTE — Patient Instructions (Addendum)
Medication Instructions:  Your physician has recommended you make the following change in your medication:   REDUCE the Amiodarone to 100 mg taking 1 daily 2.   TAKE 4 OF THE 81 MG ASPIRIN UNTIL MAY 2026, THEN YOU CAN GO TO 81 MG DAILY 3.   START Over The Counter Prilosec 20 mg taking 1 daily  for 3 MONTHS THEN STOP.  *If you need a refill on your cardiac medications before your next appointment, please call your pharmacy*   Lab Work: TODAY:  BMET  WEEK OF APRIL 11TH,  GO TO [facility], FASTING, FOR:  CMET & LIPID  If you have labs (blood work) drawn today and your tests are completely normal, you will receive your results only by: MyChart Message (if you have MyChart) OR A paper copy in the mail If you have any lab test that is abnormal or we need to change your treatment, we will call you to review the results.   Testing/Procedures: None ordered today   Follow-Up: At [facility], you and your health needs are our priority.  As part of our continuing mission to provide you with exceptional heart care, we have created designated Provider Care Teams.  These Care Teams include your primary Cardiologist (physician) and Advanced Practice Providers (APPs -  Physician Assistants and Nurse Practitioners) who all work together to provide you with the care you need, when you need it.  We recommend signing up for the patient portal called "MyChart".  Sign up information is provided on this After Visit Summary.  MyChart is used to connect with patients for Virtual Visits (Telemedicine).  Patients are able to view lab/test results, encounter notes, upcoming appointments, etc.  Non-urgent messages can be sent to your provider as well.   To learn more about what you can do with MyChart, go to [email or web address].    Your next appointment:   As scheduled  Provider:   Tessa Lerner, DO  or Tereso Newcomer, PA-C         Other Instructions

## 2023-10-25 LAB — BASIC METABOLIC PANEL
BUN/Creatinine Ratio: 18 (ref 12–28)
BUN: 14 mg/dL (ref 8–27)
CO2: 22 mmol/L (ref 20–29)
Calcium: 9.5 mg/dL (ref 8.7–10.3)
Chloride: 103 mmol/L (ref 96–106)
Creatinine, Ser: 0.77 mg/dL (ref 0.57–1.00)
Glucose: 102 mg/dL — ABNORMAL HIGH (ref 70–99)
Potassium: 5.2 mmol/L (ref 3.5–5.2)
Sodium: 140 mmol/L (ref 134–144)
eGFR: 79 mL/min/{1.73_m2} (ref >59)

## 2023-10-25 LAB — COMPREHENSIVE METABOLIC PANEL
ALT: 28 [IU]/L (ref 0–32)
AST: 27 [IU]/L (ref 0–40)
Albumin: 3.9 g/dL (ref 3.8–4.8)
Alkaline Phosphatase: 138 [IU]/L — ABNORMAL HIGH (ref 44–121)
BUN/Creatinine Ratio: 19 (ref 12–28)
BUN: 14 mg/dL (ref 8–27)
Bilirubin Total: 0.3 mg/dL (ref 0.0–1.2)
CO2: 22 mmol/L (ref 20–29)
Calcium: 9.6 mg/dL (ref 8.7–10.3)
Chloride: 103 mmol/L (ref 96–106)
Creatinine, Ser: 0.74 mg/dL (ref 0.57–1.00)
Globulin, Total: 3 g/dL (ref 1.5–4.5)
Glucose: 101 mg/dL — ABNORMAL HIGH (ref 70–99)
Potassium: 5.2 mmol/L (ref 3.5–5.2)
Sodium: 141 mmol/L (ref 134–144)
Total Protein: 6.9 g/dL (ref 6.0–8.5)
eGFR: 83 mL/min/{1.73_m2} (ref >59)

## 2023-10-25 LAB — LIPID PANEL
Chol/HDL Ratio: 2.3 {ratio} (ref 0.0–4.4)
Cholesterol, Total: 121 mg/dL (ref 100–199)
HDL: 53 mg/dL (ref >39)
LDL Chol Calc (NIH): 47 mg/dL (ref 0–99)
Triglycerides: 120 mg/dL (ref 0–149)
VLDL Cholesterol Cal: 21 mg/dL (ref 5–40)

## 2023-10-27 ENCOUNTER — Telehealth (HOSPITAL_COMMUNITY): Payer: Self-pay

## 2023-10-27 NOTE — Telephone Encounter (Signed)
 Called and spoke with pt in regards to CR, pt stated she is not interested at this time.   Closed referral

## 2023-10-27 NOTE — Telephone Encounter (Signed)
 Pt insurance is active and benefits verified through [facility] Medicare Dual Complete. Co-pay $0.00, DED $257.00/$257.00 met, out of pocket $9,350.00/$375.41 met, co-insurance 20%. No pre-authorization required. Passport, 10/27/23 @ 2:28PM, REF#[id number]   How many CR sessions are covered? (36 visits for TCR, 72 visits for ICR)72 Is this a lifetime maximum or an annual maximum? Annual Has the member used any of these services to date? No Is there a time limit (weeks/months) on start of program and/or program completion? No     Will contact patient to see if she is interested in the Cardiac Rehab Program. If interested, patient will need to complete follow up appt. Once completed, patient will be contacted for scheduling upon review by the RN Navigator.

## 2023-10-30 ENCOUNTER — Telehealth: Payer: Self-pay

## 2023-10-30 NOTE — Telephone Encounter (Signed)
 Patient contacted the office requesting clearance to drive and attend cardiac rehab. Advised that patient would need to be seen in the office once before clearance and rehab would be approved. She acknowledged receipt. She inquired about a handicap placard. Advised we do not give these as patient should be walking. She acknowledged receipt.

## 2023-11-17 ENCOUNTER — Encounter: Payer: Self-pay | Admitting: Cardiology

## 2023-11-17 ENCOUNTER — Telehealth: Payer: Self-pay | Admitting: Cardiology

## 2023-11-17 ENCOUNTER — Telehealth: Payer: Self-pay

## 2023-11-17 ENCOUNTER — Ambulatory Visit: Payer: 59 | Attending: Cardiology | Admitting: Cardiology

## 2023-11-17 VITALS — BP 148/66 | HR 82 | Ht 60.0 in | Wt 163.8 lb

## 2023-11-17 DIAGNOSIS — I6522 Occlusion and stenosis of left carotid artery: Secondary | ICD-10-CM

## 2023-11-17 DIAGNOSIS — E7800 Pure hypercholesterolemia, unspecified: Secondary | ICD-10-CM

## 2023-11-17 DIAGNOSIS — I2510 Atherosclerotic heart disease of native coronary artery without angina pectoris: Secondary | ICD-10-CM | POA: Diagnosis not present

## 2023-11-17 DIAGNOSIS — I10 Essential (primary) hypertension: Secondary | ICD-10-CM

## 2023-11-17 DIAGNOSIS — Z951 Presence of aortocoronary bypass graft: Secondary | ICD-10-CM

## 2023-11-17 DIAGNOSIS — I4891 Unspecified atrial fibrillation: Secondary | ICD-10-CM

## 2023-11-17 DIAGNOSIS — I9789 Other postprocedural complications and disorders of the circulatory system, not elsewhere classified: Secondary | ICD-10-CM | POA: Diagnosis not present

## 2023-11-17 MED ORDER — ASPIRIN 81 MG PO TBEC: 81.0000 mg | DELAYED_RELEASE_TABLET | Freq: Every day | ORAL | Status: AC

## 2023-11-17 MED ORDER — ASPIRIN 325 MG PO TBEC: 325.0000 mg | DELAYED_RELEASE_TABLET | Freq: Every day | ORAL | Status: AC

## 2023-11-17 MED ORDER — LOSARTAN POTASSIUM 25 MG PO TABS: 25.0000 mg | ORAL_TABLET | Freq: Every day | ORAL | 3 refills | Status: DC

## 2023-11-17 MED ORDER — METOPROLOL SUCCINATE ER 25 MG PO TB24: 25.0000 mg | ORAL_TABLET | Freq: Every day | ORAL | 3 refills | Status: AC

## 2023-11-17 MED ORDER — AMIODARONE HCL 100 MG PO TABS: 100.0000 mg | ORAL_TABLET | Freq: Every day | ORAL | Status: DC

## 2023-11-17 NOTE — Telephone Encounter (Signed)
 Pt advised...Marland Kitchen per Dr Odis Hollingshead she will:   STAY on the ASA 325 mg daily until 01/04/24 THEN, STOP AMIODARONE AND ASA 325 mg on 01/04/24.Marland Kitchen... then START ASA 81 mg daily.   Pt verbalized understanding and read back to me. She will call if she has any further questions.

## 2023-11-17 NOTE — Telephone Encounter (Signed)
 Spoke with pt over the phone and instructed her to follow the front page of her AVS that outlines to stop Amlodipine now, stop Amiodarone on 4/27, and start Toprol-XL now. Pt verbalized understanding and had no further questions at this time.

## 2023-11-17 NOTE — Progress Notes (Signed)
 Cardiology Office Note:    Date:  11/17/2023  NAME:  Megan Pham    MRN: [id number] DOB:  [birth year]   PCP:  Pollyann Samples, MD  Former Cardiology Providers: NA Primary Cardiologist:  Tessa Lerner, DO, [facility] (established care 07/03/2023) Electrophysiologist:  None   Chief Complaint  Patient presents with   Atherosclerosis of native coronary artery of native heart w    Status post CABG   Follow-up    History of Present Illness:    Megan Pham is a 78 y.o. Caucasian female whose past medical history and cardiovascular risk factors includes: Severe coronary artery calcification, coronary artery disease per CCTA and s/p 4v CABG (L-LAD, S-PDA, S-OM1, S-OM2) , hypertension, dyslipidemia, carotid artery disease, former smoker.   Patient was referred to the practice for evaluation of carotid disease and shortness of breath.  Shortness of breath felt to be driven by possible cardiac etiology and therefore was recommended to undergo echocardiogram as well as coronary CTA.  Echocardiogram noted preserved LVEF, multivessel CAD as per coronary CTA.  Patient was given the option and consented for left heart catheterization possible intervention.  She underwent left heart catheterization and noted to have obstructive multivessel CAD and referred to cardiothoracic surgery for further evaluation and management.  Patient underwent four-vessel bypass with LIMA to the LAD, SVG to PDA, SVG to OM1, SVG to OM2 with Dr. Cliffton Asters.  Postoperative course was complicated by left pneumothorax with subcutaneous emphysema requiring chest tube placement, pleural effusion status postthoracentesis and postop atrial fibrillation.  She was recommended to be on amiodarone for the first 90 days as well as aspirin 325 mg p.o. daily and then transition to aspirin 81 mg p.o. daily and discontinue amiodarone.  She is accompanied by her best friend at today's office visit and she provides verbal consent with having her  present.  Postsurgery patient is doing well from cardiovascular standpoint.  Denies anginal chest pain and shortness of breath is significantly improved.  Patient has concerns regarding her left heart catheterization.  Spoke to her in detail and her questions and concerns addressed to her satisfaction.  Follow-up labs noted significant improvement in lipids.  Medications reconciled.  Current Medications: Current Meds  Medication Sig   amiodarone (PACERONE) 100 MG tablet Take 1 tablet (100 mg total) by mouth daily.   aspirin EC 325 MG tablet Take 1 tablet (325 mg total) by mouth daily.   diphenhydramine-acetaminophen (TYLENOL PM) 25-500 MG TABS tablet Take 2 tablets by mouth at bedtime.   losartan (COZAAR) 25 MG tablet Take 1 tablet (25 mg total) by mouth daily. HOLD is systolic blood pressure (top number) is less than 120.   Multiple Vitamins-Minerals (PRESERVISION AREDS 2 PO) Take 1 tablet by mouth 2 (two) times daily.   rosuvastatin (CRESTOR) 40 MG tablet Take 1 tablet (40 mg total) by mouth daily.   [DISCONTINUED] metoprolol succinate (TOPROL-XL) 25 MG 24 hr tablet Take 1 tablet (25 mg total) by mouth daily.     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   Dyslipidemia    Dyspnea    Family history of breast cancer 05/27/2022   Family history of gastric cancer 05/27/2022   HTN (hypertension)  Hyperlipidemia    Osteoarthritis    Osteopenia    Poison ivy    Pseudoptosis, unspecified laterality    Ulcer 1979   ulcerative colitis    Past Surgical History: Past Surgical History:  Procedure Laterality Date   ABDOMINAL HYSTERECTOMY  1973   Partial hysterectomy   APPENDECTOMY  1973   CHOLECYSTECTOMY  2000   COLONOSCOPY  2004   by  Dr.James Weissman-had left sided diverticulosis   CORONARY ARTERY BYPASS GRAFT N/A 10/06/2023   Procedure: CORONARY ARTERY BYPASS GRAFTING (CABG) TIMES FOUR USING LEFT INTERNAL MAMMARY ARTERY AND BILATERALLY ENDOSCOPICALLY HARVESTED GREATER SAPHENOUS VEINS;  Surgeon: Corliss Skains, MD;  Location: [facility] OR;  Service: Open Heart Surgery;  Laterality: N/A;   HAMMER TOE SURGERY  2004   HERNIA REPAIR     IR THORACENTESIS ASP PLEURAL SPACE W/IMG GUIDE  10/15/2023   LEFT HEART CATH AND CORONARY ANGIOGRAPHY N/A 08/26/2023   Procedure: LEFT HEART CATH AND CORONARY ANGIOGRAPHY;  Surgeon: Tonny Bollman, MD;  Location: [facility] INVASIVE CV LAB;  Service: Cardiovascular;  Laterality: N/A;   TEE WITHOUT CARDIOVERSION N/A 10/06/2023   Procedure: TRANSESOPHAGEAL ECHOCARDIOGRAM (TEE);  Surgeon: Corliss Skains, MD;  Location: [facility] OR;  Service: Open Heart Surgery;  Laterality: N/A;   TONSILLECTOMY  1970    Social History: Social History   Tobacco Use   Smoking status: Former    Current packs/day: 0.00    Types: Cigarettes    Quit date: 09/09/2006    Years since quitting: 17.2   Smokeless tobacco: Never  Vaping Use   Vaping status: Never Used  Substance Use Topics   Alcohol use: No   Drug use: No    Family History: Family History  Problem Relation Age of Onset   Arthritis Mother    Cancer Mother        lung   Arthritis Father    Cancer Father        throat and lung; d. 70   Breast cancer Sister 31   Other Sister        CTNNA1 mutation   Gastric cancer Sister        dx 36s; ? colon cancer   Arthritis Maternal Aunt    Breast cancer Maternal Aunt        dx after 50   Arthritis Maternal Uncle    Cancer Maternal Uncle        ? behind ear   Arthritis Paternal Aunt    Arthritis Paternal Uncle    Arthritis Maternal Grandmother    Arthritis Maternal Grandfather    Cancer Maternal Grandfather        mouth   Arthritis Paternal Grandmother    Breast cancer Paternal Grandmother        d. 30s    Arthritis Paternal Grandfather    Cancer Paternal Grandfather        unknown type   Arthritis Son    Healthy Grandchild        x 2   Breast cancer Cousin        d. 58   Colon cancer Neg Hx     ROS:   Review of Systems  Cardiovascular:  Negative for chest pain, claudication, irregular heartbeat, leg swelling, near-syncope, orthopnea, palpitations, paroxysmal nocturnal dyspnea and syncope.  Respiratory:  Negative for shortness of breath.   Hematologic/Lymphatic: Negative for bleeding problem.    EKGs/Labs/Other Studies Reviewed:   EKG Interpretation Date/Time:  Monday November 17 2023 12:07:08 EDT Ventricular Rate:  82 PR Interval:  186 QRS Duration:  82 QT Interval:  402 QTC Calculation: 469 R Axis:   -9  Text Interpretation: Normal sinus rhythm Possible Left atrial enlargement When compared with ECG of 24-Oct-2023 08:47, Premature ventricular complexes are no longer Present Nonspecific T wave abnormality no longer evident in Lateral leads QT has lengthened Confirmed by Tessa Lerner ([id number]) on 11/17/2023 12:09:21 PM    Echo: November 21st 2024 LVEF: 60-65% Diastolic Function: Grade 1 No significant valvular heart disease. Ascending aorta 39 mm Estimated RAP 3 mmHg   CCTA 07/30/2023 1. Significant diffuse CAD with CTO noted, CADRADS = 5. CT FFR will be performed and reported separately. Diffuse moderate disease in LAD and LCx, with OM3 modeled as CTO. RCA with 50-69% stenosis in proximal and mid vessel, and 70-99% stenosis in distal vessel. Distal RCA appears CTO.  2. Coronary calcium score of 1364. This was 95th percentile for age-, sex-, and race- matched controls.  3. Total plaque volume 974 mm3 which is 86th percentile for age- and sex- matched controls (calcified plaque 220 mm3; noncalcified plaque 754 mm3). Total plaque volume is extensive.  4. Normal coronary origin with right dominance.  CT-FFR: Nov 2024 1. CT FFR analysis did not show any significant focal  stenosis, but distal RCA and OM3 modeled as CTO. Distal LAD caliber is too small to be evaluated.  Carotid Duplex  06/23/2023 at [facility] see Care Everywhere.  1.  Prominent soft plaque within the mid left common carotid artery with associated stenosis at this level and velocity elevation. This is concerning for a clinically significant stenosis.  2.  Proximal left ICA velocity suggesting 50-69% stenosis  3.  No evidence of a hemodynamically significant right-sided carotid stenosis.  4.  Both vertebral arteries are patent with antegrade flow.  5.  Consider carotid CTA scan for further evaluation of these findings.   LHC  08/26/2023 1.  Patent left main with no significant stenosis 2.  Total occlusion of the RCA with left-to-right collaterals 3.  Severe stenosis of the proximal and mid LAD with 80% lesions in both portions of the vessel 4.  Severe stenosis of the mid circumflex extending into a large OM 2 branch with total occlusion of the OM1 supplied by left to left collaterals   Recommendations: Patient with diffuse calcific multivessel coronary artery disease including total occlusion of the dominant RCA.  Favor surgical referral for CABG.  Plan discussed with the patient who is agreeable to proceed.  Vascular CABG [place] 09/18/2023 Right Carotid: Velocities in the right ICA are consistent with a 1-39% stenosis.   Left Carotid: Velocities in the left ICA are consistent with a 40-59% stenosis. Hemodynamically significant plaque >50% visualized in the CCA. The ECA appears >50% stenosed.  Vertebrals:  Bilateral vertebral arteries demonstrate antegrade flow.  Subclavians: Normal flow hemodynamics were seen in bilateral subclavian arteries.   Right ABI: Resting right ankle-brachial index is within normal range. The right toe-brachial index is normal with abnormal waveform.  Left ABI: Resting left ankle-brachial index is within normal range. The left toe-brachial index is mildly reduced with  abnormal waveform.  Right Upper Extremity: Doppler waveforms decrease 50% with right radial  compression. Doppler waveforms decrease >50% with right ulnar compression.  Left Upper Extremity: Doppler waveforms remain within normal limits with  left radial compression. Doppler waveforms remain within normal limits with left ulnar compression.   Labs:    Latest Ref Rng & Units 10/13/2023    3:35 AM 10/12/2023  3:18 AM 10/11/2023    3:26 AM  CBC  WBC 4.0 - 10.5 K/uL 9.8  10.6  8.6   Hemoglobin 12.0 - 15.0 g/dL 9.1  9.1  8.8   Hematocrit 36.0 - 46.0 % 27.3  27.2  26.7   Platelets 150 - 400 K/uL 308  299  235        Latest Ref Rng & Units 10/24/2023   10:52 AM 10/24/2023   10:47 AM 10/15/2023    3:29 AM  BMP  Glucose 70 - 99 mg/dL 161  096  99   BUN 8 - 27 mg/dL 14  14  15    Creatinine 0.57 - 1.00 mg/dL 0.45  4.09  8.11   BUN/Creat Ratio 12 - 28 18  19     Sodium 134 - 144 mmol/L 140  141  135   Potassium 3.5 - 5.2 mmol/L 5.2  5.2  3.5   Chloride 96 - 106 mmol/L 103  103  100   CO2 20 - 29 mmol/L 22  22  25    Calcium 8.7 - 10.3 mg/dL 9.5  9.6  8.2       Latest Ref Rng & Units 10/24/2023   10:52 AM 10/24/2023   10:47 AM 10/15/2023    3:29 AM  CMP  Glucose 70 - 99 mg/dL 914  782  99   BUN 8 - 27 mg/dL 14  14  15    Creatinine 0.57 - 1.00 mg/dL 9.56  2.13  0.86   Sodium 134 - 144 mmol/L 140  141  135   Potassium 3.5 - 5.2 mmol/L 5.2  5.2  3.5   Chloride 96 - 106 mmol/L 103  103  100   CO2 20 - 29 mmol/L 22  22  25    Calcium 8.7 - 10.3 mg/dL 9.5  9.6  8.2   Total Protein 6.0 - 8.5 g/dL  6.9    Total Bilirubin 0.0 - 1.2 mg/dL  0.3    Alkaline Phos 44 - 121 IU/L  138    AST 0 - 40 IU/L  27    ALT 0 - 32 IU/L  28      Lab Results  Component Value Date   CHOL 121 10/24/2023   HDL 53 10/24/2023   LDLCALC 47 10/24/2023   LDLDIRECT 187.4 10/14/2013   TRIG 120 10/24/2023   CHOLHDL 2.3 10/24/2023   No results for input(s): "LIPOA" in the last 8760 hours. No components found for:  "NTPROBNP" No results for input(s): "PROBNP" in the last 8760 hours. No results for input(s): "TSH" in the last 8760 hours.    Physical Exam:    Today's Vitals   11/17/23 1130  BP: (!) 148/66  Pulse: 82  SpO2: 97%  Weight: 163 lb 12.8 oz (74.3 kg)  Height: 5' (1.524 m)   Body mass index is 31.99 kg/m. Wt Readings from Last 3 Encounters:  11/17/23 163 lb 12.8 oz (74.3 kg)  10/24/23 167 lb (75.8 kg)  10/18/23 168 lb 3.4 oz (76.3 kg)    Physical Exam  Constitutional: No distress.  hemodynamically stable  Neck: No JVD present.  Cardiovascular: Normal rate, regular rhythm, S1 normal, S2 normal, intact distal pulses and normal pulses. Exam reveals no gallop, no S3 and no S4.  No murmur heard. Pulses:      Carotid pulses are  on the left side with bruit. Pulmonary/Chest: Effort normal and breath sounds normal. No stridor. She has no wheezes. She has no rales.  Sternotomy site is well-healed, close incision marks in the upper abdomen well-healed, no evidence of ecchymosis.  Abdominal: Soft. Bowel sounds are normal. She exhibits no distension. There is no abdominal tenderness.  Musculoskeletal:        General: No edema.     Cervical back: Neck supple.  Neurological: She is alert and oriented to person, place, and time. She has intact cranial nerves (2-12).  Skin: Skin is warm and moist.   Impression & Recommendation(s):  Impression:   ICD-10-CM   1. Coronary artery disease involving native coronary artery of native heart without angina pectoris  I25.10 EKG 12-Lead    AMB referral to cardiac rehabilitation    2. Hx of CABG  Z95.1 AMB referral to cardiac rehabilitation    3. Essential hypertension  I10 losartan (COZAAR) 25 MG tablet    Basic metabolic panel    metoprolol succinate (TOPROL-XL) 25 MG 24 hr tablet    Basic metabolic panel    4. Postoperative atrial fibrillation (HCC)  I97.89    I48.91     5. Stenosis of left carotid artery  I65.22 AMB referral to cardiac  rehabilitation    6. Pure hypercholesterolemia  E78.00      Recommendation(s):  Coronary artery disease involving native coronary artery of native heart without angina pectoris Hx of CABG Total CAC 1364, 95th percentile. Multivessel CAD concerns for chronic total occlusion on coronary CTA. Underwent left heart catheterization which redemonstrated multivessel CAD. Referred to cardiothoracic surgery and underwent four-vessel bypass in January 2025. Postoperative course complicated by pneumothorax with subcutaneous emphysema requiring chest tube placement, pleural effusion status post thoracentesis, and postop A-fib. Overall recovering well. Has appointment with cardiothoracic surgery later this week. Has not started cardiac rehab-awaiting CT surgery clearance Will place a referral for cardiac rehab status post CABG Continue Toprol-XL 25 mg p.o. daily. Start losartan 25 mg p.o. every afternoon. BMP in one week to check renal function and electrolytes Continue aspirin 325 mg p.o. daily, total 90 days postsurgery.  As of January 04, 2024 transition to aspirin 81 mg  p.o. daily Reemphasized the importance of secondary prevention with focus on improving her modifiable cardiovascular risk factors such as glycemic control, lipid management, blood pressure control, weight loss.  Essential hypertension Office blood pressure not at goal. Currently not on amlodipine or losartan. Will restart losartan at a lower dose as mentioned above. Reemphasized importance of low-salt diet.  Postoperative atrial fibrillation (HCC) Rate control: Metoprolol. Rhythm control: Amiodarone. Thromboembolic prophylaxis: Aspirin Shared decision was to discontinue amiodarone use 90 days after bypass surgery and transition her to aspirin 81 mg p.o. daily. EKG today illustrates sinus rhythm  Stenosis of left carotid artery Will need to follow-up carotid duplex in January 2026 sooner if change in clinical status-still  need to be ordered Continue aspirin and statin therapy  Pure hypercholesterolemia Her LDL as of February 2023 was 154 mg/dL. Currently on Crestor 40 mg p.o. daily Follow-up lipids in February 2025 illustrate an LDL level of 47 mg/dL, currently at goal. Continue medical therapy. Does not endorse myalgias.  Orders Placed:  Orders Placed This Encounter  Procedures   Basic metabolic panel    Standing Status:   Future    Number of Occurrences:   1    Expected Date:   11/24/2023    Expiration Date:   11/16/2024   AMB referral to cardiac rehabilitation    Referral Priority:   Routine    Referral Type:   Consultation  Number of Visits Requested:   1   EKG 12-Lead   Final Medication List:    Meds ordered this encounter  Medications   losartan (COZAAR) 25 MG tablet    Sig: Take 1 tablet (25 mg total) by mouth daily. HOLD is systolic blood pressure (top number) is less than 120.    Dispense:  30 tablet    Refill:  3   metoprolol succinate (TOPROL-XL) 25 MG 24 hr tablet    Sig: Take 1 tablet (25 mg total) by mouth daily.    Dispense:  90 tablet    Refill:  3     Current Outpatient Medications:    amiodarone (PACERONE) 100 MG tablet, Take 1 tablet (100 mg total) by mouth daily., Disp: 90 tablet, Rfl: 3   aspirin EC 325 MG tablet, Take 1 tablet (325 mg total) by mouth daily., Disp: , Rfl:    diphenhydramine-acetaminophen (TYLENOL PM) 25-500 MG TABS tablet, Take 2 tablets by mouth at bedtime., Disp: , Rfl:    losartan (COZAAR) 25 MG tablet, Take 1 tablet (25 mg total) by mouth daily. HOLD is systolic blood pressure (top number) is less than 120., Disp: 30 tablet, Rfl: 3   Multiple Vitamins-Minerals (PRESERVISION AREDS 2 PO), Take 1 tablet by mouth 2 (two) times daily., Disp: , Rfl:    rosuvastatin (CRESTOR) 40 MG tablet, Take 1 tablet (40 mg total) by mouth daily., Disp: 90 tablet, Rfl: 3   amLODipine (NORVASC) 5 MG tablet, Take 5 mg by mouth daily. (Patient not taking: Reported on  11/17/2023), Disp: , Rfl:    metoprolol succinate (TOPROL-XL) 25 MG 24 hr tablet, Take 1 tablet (25 mg total) by mouth daily., Disp: 90 tablet, Rfl: 3   omeprazole (PRILOSEC OTC) 20 MG tablet, Take 1 tablet (20 mg total) by mouth daily. (Patient not taking: Reported on 11/17/2023), Disp: , Rfl:    traMADol (ULTRAM) 50 MG tablet, Take 1 tablet (50 mg total) by mouth every 6 (six) hours as needed for moderate pain (pain score 4-6). (Patient not taking: Reported on 11/17/2023), Disp: 28 tablet, Rfl: 0  Consent:   NA  Disposition:   3 months sooner if needed.  Her questions and concerns were addressed to her satisfaction. She voices understanding of the recommendations provided during this encounter.    Signed, Tessa Lerner, DO, [facility]  [facility]  [address] [place], [place] [address] 11/17/2023 12:58 PM

## 2023-11-17 NOTE — Patient Instructions (Addendum)
 Medication Instructions:  Your physician has recommended you make the following change in your medication:   STOP Amlodipine  STOP Amiodarone 01/04/24  DECREASE Aspirin to 81 mg once daily   START Losartan 25 mg once daily in the evening *HOLD this medication is systolic blood pressure (top number) is less than 120  Refill for Metoprolol has been sent to your pharmacy.   *If you need a refill on your cardiac medications before your next appointment, please call your pharmacy*  Lab Work: To be completed in 1 week: BMP  If you have labs (blood work) drawn today and your tests are completely normal, you will receive your results only by: MyChart Message (if you have MyChart) OR A paper copy in the mail If you have any lab test that is abnormal or we need to change your treatment, we will call you to review the results.  Testing/Procedures: None ordered today.  Follow-Up: At [facility], you and your health needs are our priority.  As part of our continuing mission to provide you with exceptional heart care, we have created designated Provider Care Teams.  These Care Teams include your primary Cardiologist (physician) and Advanced Practice Providers (APPs -  Physician Assistants and Nurse Practitioners) who all work together to provide you with the care you need, when you need it.   Your next appointment:   3 month(s)  The format for your next appointment:   In Person  Provider:   Tessa Lerner, DO {  Other Instructions You have been referred to cardiac rehab. Start cardiac rehab once Dr. Cliffton Asters has cleared you.

## 2023-11-17 NOTE — Telephone Encounter (Signed)
 Pt c/o medication issue:  1. Name of Medication:amLODipine (NORVASC)  2. How are you currently taking this medication (dosage and times per day)? Yes Take 5 mg by mouth daily  3. Are you having a reaction (difficulty breathing--STAT)? no  4. What is your medication issue? Pt went to office visit today was told to stop taking the med listed above but she's doesn't have clear instructions from the AVS.

## 2023-11-18 ENCOUNTER — Other Ambulatory Visit: Payer: Self-pay | Admitting: Thoracic Surgery (Cardiothoracic Vascular Surgery)

## 2023-11-18 ENCOUNTER — Telehealth: Payer: Self-pay | Admitting: Cardiology

## 2023-11-18 DIAGNOSIS — I2510 Atherosclerotic heart disease of native coronary artery without angina pectoris: Secondary | ICD-10-CM

## 2023-11-18 NOTE — Telephone Encounter (Signed)
 Pt c/o medication issue:  1. Name of Medication:    2. How are you currently taking this medication (dosage and times per day)?    3. Are you having a reaction (difficulty breathing--STAT)? no  4. What is your medication issue? Patient calling in to see if she suppose to take both the aspirin dosage. Please advise

## 2023-11-18 NOTE — Telephone Encounter (Signed)
 Spoke with pt and explained that she is to continue taking the Aspirin 325 mg daily until 01/04/24. On 4/27, she will stop amiodarone and also decrease her aspirin to 81 mg once daily. Pt verbalized understanding and had no further questions at this time.

## 2023-11-19 ENCOUNTER — Ambulatory Visit
Admission: RE | Admit: 2023-11-19 | Discharge: 2023-11-19 | Disposition: A | Discharge status: Home / Self Care | Source: Ambulatory Visit | Attending: Thoracic Surgery (Cardiothoracic Vascular Surgery) | Admitting: Thoracic Surgery (Cardiothoracic Vascular Surgery)

## 2023-11-19 ENCOUNTER — Ambulatory Visit (INDEPENDENT_AMBULATORY_CARE_PROVIDER_SITE_OTHER): Payer: Self-pay | Admitting: Surgical

## 2023-11-19 VITALS — BP 153/78 | HR 89 | Resp 18 | Ht 60.0 in | Wt 163.0 lb

## 2023-11-19 DIAGNOSIS — Z951 Presence of aortocoronary bypass graft: Secondary | ICD-10-CM

## 2023-11-19 DIAGNOSIS — I2510 Atherosclerotic heart disease of native coronary artery without angina pectoris: Secondary | ICD-10-CM

## 2023-11-19 NOTE — Progress Notes (Signed)
 [address]       [place] [address]             [phone]      BRYTNEY SOMES [facility] Medical Record #[id number] Date of Birth: 08/03/1946  Referring: Tessa Lerner, DO Primary Care: Beam, Chales Salmon, MD Primary Cardiologist: Tessa Lerner, DO   Chief Complaint:   POST OP FOLLOW UP   10/06/2023 Patient:  Megan Pham Pre-Op Dx: 3V CAD HTN HLP Obesity DM   Post-op Dx:  same Procedure: CABG X 4.  LIMA LAD, RSVG PDA, OM1, OM2   Endoscopic greater saphenous vein harvest on the right and left     Surgeon and Role:      * Lightfoot, Eliezer Lofts, MD - Primary    Jacques Earthly , PA-C - assisting    Anesthesia  general   History of Present Illness patient is a 78 year old female seen in the office on today's date and routine postsurgical follow-up status post the above described procedure.  Overall she is doing well.  She is not requiring any pain medication at this point.  She is increasing her activities as tolerated.  She is anxious to start cardiac rehab and we discussed that she is now able to do that.  She is not having any fevers, chills or other significant constitutional symptoms.  She is not having any chest pain or shortness of breath.  She is not having any lower extremities.  She has had no difficulties with her incisions other than she does describe some numbness in both the chest and EVH sites.  She has been seen by cardiology and they have made some adjustments to her medications recently.      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   Dyslipidemia    Dyspnea    Family history of breast cancer 05/27/2022   Family history of gastric cancer 05/27/2022   HTN (hypertension)    Hyperlipidemia    Osteoarthritis    Osteopenia    Poison ivy    Pseudoptosis, unspecified laterality    Ulcer 1979    ulcerative colitis     Social History   Tobacco Use  Smoking Status Former   Current packs/day: 0.00   Types: Cigarettes   Quit date: 09/09/2006   Years since quitting: 17.2  Smokeless Tobacco Never    Social History   Substance and Sexual Activity  Alcohol Use No     Allergies  Allergen Reactions   Iodinated Contrast Media Hives and Itching   Cefoxitin Sodium In Dextrose Hives   Mefoxin [Cefoxitin] Hives    "Myfoxin" during surgery per pt.     Current Outpatient Medications  Medication Sig Dispense Refill   amiodarone (PACERONE) 100 MG tablet Take 1 tablet (100 mg total) by mouth daily.     aspirin EC 325 MG tablet Take 1 tablet (325 mg total) by mouth daily.     [START ON 01/04/2024] aspirin EC 81 MG tablet Take 1 tablet (81 mg total) by mouth daily. Swallow whole.     diphenhydramine-acetaminophen (TYLENOL PM) 25-500 MG TABS tablet Take 2 tablets by mouth at bedtime.     losartan (COZAAR) 25 MG tablet Take 1 tablet (25 mg total) by mouth daily. HOLD is systolic blood pressure (  top number) is less than 120. 30 tablet 3   metoprolol succinate (TOPROL-XL) 25 MG 24 hr tablet Take 1 tablet (25 mg total) by mouth daily. 90 tablet 3   Multiple Vitamins-Minerals (PRESERVISION AREDS 2 PO) Take 1 tablet by mouth 2 (two) times daily.     omeprazole (PRILOSEC OTC) 20 MG tablet Take 1 tablet (20 mg total) by mouth daily.     rosuvastatin (CRESTOR) 40 MG tablet Take 1 tablet (40 mg total) by mouth daily. 90 tablet 3   traMADol (ULTRAM) 50 MG tablet Take 1 tablet (50 mg total) by mouth every 6 (six) hours as needed for moderate pain (pain score 4-6). 28 tablet 0   No current facility-administered medications for this visit.       Physical Exam: BP (!) 153/78   Pulse 89   Resp 18   Ht 5' (1.524 m)   Wt 163 lb (73.9 kg)   SpO2 94%   BMI 31.83 kg/m   General appearance: alert, cooperative, and no distress Cardiac: Regular rate and rhythm Lungs: Mildly diminished in the  bases Incisions: Healing well without evidence of infection.  Diagnostic Studies & Laboratory data:     Recent Radiology Findings:  Reviewed, significantly improving bilateral aeration in the lower fields    Recent Lab Findings: Lab Results  Component Value Date   WBC 9.8 10/13/2023   HGB 9.1 (L) 10/13/2023   HCT 27.3 (L) 10/13/2023   PLT 308 10/13/2023   GLUCOSE 102 (H) 10/24/2023   CHOL 121 10/24/2023   TRIG 120 10/24/2023   HDL 53 10/24/2023   LDLDIRECT 187.4 10/14/2013   LDLCALC 47 10/24/2023   ALT 28 10/24/2023   AST 27 10/24/2023   NA 140 10/24/2023   K 5.2 10/24/2023   CL 103 10/24/2023   CREATININE 0.77 10/24/2023   BUN 14 10/24/2023   CO2 22 10/24/2023   TSH 3.34 11/01/2021   INR 1.6 (H) 10/06/2023   HGBA1C 6.0 (H) 10/02/2023      Assessment / Plan: He continues to do well.  We discussed activity progression including cardiac rehab as well as driving and lifting restrictions.  He does have some hypertension this visit.  She understands to keep track of her blood pressure.  I did not make any changes to her current medication regimen.  We will see the patient again on a as needed basis for any surgically related needs or at request.      Medication Changes: No orders of the defined types were placed in this encounter.     Rowe Clack, PA-C  11/19/2023 2:53 PM

## 2023-11-19 NOTE — Patient Instructions (Signed)
 Activity progression including lifting and driving directions as discussed. May start cardiac rehab

## 2023-11-20 LAB — BASIC METABOLIC PANEL
BUN/Creatinine Ratio: 18 (ref 12–28)
BUN: 14 mg/dL (ref 8–27)
CO2: 21 mmol/L (ref 20–29)
Calcium: 9.4 mg/dL (ref 8.7–10.3)
Chloride: 102 mmol/L (ref 96–106)
Creatinine, Ser: 0.78 mg/dL (ref 0.57–1.00)
Glucose: 112 mg/dL — ABNORMAL HIGH (ref 70–99)
Potassium: 4.3 mmol/L (ref 3.5–5.2)
Sodium: 139 mmol/L (ref 134–144)
eGFR: 78 mL/min/{1.73_m2} (ref >59)

## 2023-12-04 ENCOUNTER — Ambulatory Visit: Payer: 59

## 2023-12-09 ENCOUNTER — Telehealth (HOSPITAL_COMMUNITY): Payer: Self-pay

## 2023-12-09 NOTE — Telephone Encounter (Signed)
 Called patient to see if she was interested in participating in the Cardiac Rehab Program. Patient will come in for orientation on 12/10/23 @ 1:30PM and will attend the 1:45PM exercise class. Went over insurance, patient verbalized understanding.   Pensions consultant.

## 2023-12-09 NOTE — Telephone Encounter (Signed)
 Pt insurance is active and benefits verified through [facility] [employer]. Co-pay $0.00, DED $257.00/$257.00 met, out of pocket $9,350.00/$2,044.57 met, co-insurance 20%. No pre-authorization required. Passport, 12/09/23 @ 9:25AM, REF#[id number]   How many CR sessions are covered? (36 visits for TCR, 72 visits for ICR)72 Is this a lifetime maximum or an annual maximum? Annual Has the member used any of these services to date? No Is there a time limit (weeks/months) on start of program and/or program completion? No

## 2023-12-10 ENCOUNTER — Encounter (HOSPITAL_COMMUNITY)
Admission: RE | Admit: 2023-12-10 | Discharge: 2023-12-10 | Disposition: A | Discharge status: Home / Self Care | Source: Ambulatory Visit | Attending: Cardiology | Admitting: Cardiology

## 2023-12-10 VITALS — BP 148/66 | HR 68 | Ht 60.0 in | Wt 163.4 lb

## 2023-12-10 DIAGNOSIS — Z951 Presence of aortocoronary bypass graft: Secondary | ICD-10-CM | POA: Insufficient documentation

## 2023-12-10 NOTE — Progress Notes (Signed)
 Cardiac Rehab Medication Review   Does the patient  feel that his/her medications are working for him/her? Yes    Has the patient been experiencing any side effects to the medications prescribed? Yes  Does the patient measure his/her own blood pressure or blood glucose at home?   Yes  Does the patient have any problems obtaining medications due to transportation or finances?   No  Understanding of regimen: excellent Understanding of indications: excellent Potential of compliance: good    Comments: Charliene understands her medications and regime well. She has not been taking Losartan because she stated it made her muscles hurt when she started taking it. She is going to discuss this at her next visit with MD this month. She checks her BP daily.    Jonna Coup, MS, ACSM-CEP 12/10/2023 2:14 PM

## 2023-12-10 NOTE — Progress Notes (Signed)
 Cardiac Individual Treatment Plan  Patient Details  Name: Megan Pham MRN: [id number] Date of Birth: [birth year] Referring Provider:   Flowsheet Row INTENSIVE CARDIAC REHAB ORIENT from 12/10/2023 in [facility]  Referring Provider Tessa Lerner, [place]       Initial Encounter Date:  Flowsheet Row INTENSIVE CARDIAC REHAB ORIENT from 12/10/2023 in [facility]  Date 12/10/23       Visit Diagnosis: 10/06/23 CABG x4  Patient's Home Medications on Admission:  Current Outpatient Medications:    amiodarone (PACERONE) 100 MG tablet, Take 1 tablet (100 mg total) by mouth daily., Disp: , Rfl:    [START ON 01/04/2024] aspirin EC 81 MG tablet, Take 1 tablet (81 mg total) by mouth daily. Swallow whole., Disp: , Rfl:    diphenhydramine-acetaminophen (TYLENOL PM) 25-500 MG TABS tablet, Take 2 tablets by mouth at bedtime., Disp: , Rfl:    metoprolol succinate (TOPROL-XL) 25 MG 24 hr tablet, Take 1 tablet (25 mg total) by mouth daily., Disp: 90 tablet, Rfl: 3   Multiple Vitamins-Minerals (PRESERVISION AREDS 2 PO), Take 1 tablet by mouth 2 (two) times daily., Disp: , Rfl:    omeprazole (PRILOSEC OTC) 20 MG tablet, Take 1 tablet (20 mg total) by mouth daily., Disp: , Rfl:    rosuvastatin (CRESTOR) 40 MG tablet, Take 1 tablet (40 mg total) by mouth daily., Disp: 90 tablet, Rfl: 3   traMADol (ULTRAM) 50 MG tablet, Take 1 tablet (50 mg total) by mouth every 6 (six) hours as needed for moderate pain (pain score 4-6)., Disp: 28 tablet, Rfl: 0   aspirin EC 325 MG tablet, Take 1 tablet (325 mg total) by mouth daily. (Patient not taking: Reported on 12/10/2023), Disp: , Rfl:    losartan (COZAAR) 25 MG tablet, Take 1 tablet (25 mg total) by mouth daily. HOLD is systolic blood pressure (top number) is less than 120. 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   Dyslipidemia    Dyspnea    Family history of breast cancer 05/27/2022   Family history of gastric cancer 05/27/2022   HTN (hypertension)    Hyperlipidemia    Osteoarthritis    Osteopenia    Poison ivy    Pseudoptosis, unspecified laterality    Ulcer 1979   ulcerative colitis    Tobacco Use: Social History   Tobacco Use  Smoking Status Former   Current packs/day: 0.00   Types: Cigarettes   Quit date: 09/09/2006   Years since quitting: 17.2  Smokeless Tobacco Never    Labs: Review Flowsheet  More data exists      Latest Ref Rng & Units 11/01/2021 10/02/2023 10/06/2023 10/09/2023 10/24/2023  Labs for ITP Cardiac and Pulmonary Rehab  Cholestrol 100 - 199 mg/dL 644  - - 62  034   LDL (calc) 0 - 99 mg/dL 742  - - 13  47   HDL-C >39 mg/dL 59.56  - - 35  53   Trlycerides 0 - 149 mg/dL 387.5  - - 68  643   Hemoglobin A1c 4.8 - 5.6 % 6.0  6.0  - - -  PH, Arterial 7.35 - 7.45 - -  7.359  7.354  7.334  7.173  7.229  7.254  7.325  - -  PCO2 arterial 32 - 48 mmHg - - 42.4  41.7  46.9  55.4  54.7  53.1  47.3  - -  Bicarbonate 20.0 - 28.0 mmol/L - - 23.9  23.3  25.3  20.6  23.0  23.5  22.7  24.7  - -  TCO2 22 - 32 mmol/L - - 25  25  27  22  25  24  25  25  25  26  24  26  26  23  28   - -  Acid-base deficit 0.0 - 2.0 mmol/L - - 2.0  2.0  1.0  8.0  5.0  3.0  3.0  1.0  - -  O2 Saturation % - - 97  98  99  98  100  96  73  100  - -    Details       Multiple values from one day are sorted in reverse-chronological order         Capillary Blood Glucose: Lab Results  Component Value Date   GLUCAP 165 (H) 10/15/2023   GLUCAP 105 (H) 10/15/2023   GLUCAP 152 (H) 10/15/2023   GLUCAP 85 10/15/2023   GLUCAP 113 (H) 10/14/2023     Exercise Target Goals: Exercise Program Goal: Individual exercise  prescription set using results from initial 6 min walk test and THRR while considering  patient's activity barriers and safety.   Exercise Prescription Goal: Initial exercise prescription builds to 30-45 minutes a day of aerobic activity, 2-3 days per week.  Home exercise guidelines will be given to patient during program as part of exercise prescription that the participant will acknowledge.  Activity Barriers & Risk Stratification:  Activity Barriers & Cardiac Risk Stratification - 12/10/23 1412       Activity Barriers & Cardiac Risk Stratification   Activity Barriers Other (comment);Shortness of Breath;Deconditioning;Incisional Pain    Comments sternal precautions    Cardiac Risk Stratification High   <5 METs on            6 Minute Walk:  6 Minute Walk     Row Name 12/10/23 1544         6 Minute Walk   Phase Initial     Distance 1004 feet     Walk Time 6 minutes     # of Rest Breaks 0     MPH 1.9     METS 1.96     RPE 13     Perceived Dyspnea  1     VO2 Peak 6.88     Symptoms Yes (comment)     Comments SOB, resolved with rest. no pain     Resting HR 68 bpm     Resting BP 148/66     Resting Oxygen Saturation  97 %     Exercise Oxygen Saturation  during 6 min walk 96 %     Max Ex. HR 107 bpm     Max Ex. BP 168/60     2 Minute Post BP 146/64              Oxygen Initial Assessment:   Oxygen Re-Evaluation:   Oxygen Discharge (Final Oxygen Re-Evaluation):   Initial Exercise Prescription:  Initial Exercise Prescription - 12/10/23 1500       Date of Initial Exercise RX and Referring Provider   Date 12/10/23    Referring Provider Tessa Lerner,  DO    Expected Discharge Date 03/03/24      Recumbant Bike   Level 1    RPM 50    Watts 20    Minutes 15    METs 2      Track   Laps 15    Minutes 15    METs 2      Prescription Details   Frequency (times per week) 3    Duration Progress to 30 minutes of continuous aerobic without signs/symptoms  of physical distress      Intensity   THRR 40-80% of Max Heartrate 56-113    Ratings of Perceived Exertion 11-13    Perceived Dyspnea 0-4      Progression   Progression Continue progressive overload as per policy without signs/symptoms or physical distress.      Resistance Training   Training Prescription Yes    Weight 2    Reps 10-15             Perform Capillary Blood Glucose checks as needed.  Exercise Prescription Changes:   Exercise Comments:   Exercise Goals and Review:   Exercise Goals     Row Name 12/10/23 1413             Exercise Goals   Increase Physical Activity Yes       Intervention Provide advice, education, support and counseling about physical activity/exercise needs.;Develop an individualized exercise prescription for aerobic and resistive training based on initial evaluation findings, risk stratification, comorbidities and participant's personal goals.       Expected Outcomes Short Term: Attend rehab on a regular basis to increase amount of physical activity.;Long Term: Exercising regularly at least 3-5 days a week.;Long Term: Add in home exercise to make exercise part of routine and to increase amount of physical activity.       Increase Strength and Stamina Yes       Intervention Provide advice, education, support and counseling about physical activity/exercise needs.;Develop an individualized exercise prescription for aerobic and resistive training based on initial evaluation findings, risk stratification, comorbidities and participant's personal goals.       Expected Outcomes Short Term: Increase workloads from initial exercise prescription for resistance, speed, and METs.;Short Term: Perform resistance training exercises routinely during rehab and add in resistance training at home;Long Term: Improve cardiorespiratory fitness, muscular endurance and strength as measured by increased METs and functional capacity ( )       Able to understand and  use rate of perceived exertion (RPE) scale Yes       Intervention Provide education and explanation on how to use RPE scale       Expected Outcomes Short Term: Able to use RPE daily in rehab to express subjective intensity level;Long Term:  Able to use RPE to guide intensity level when exercising independently       Knowledge and understanding of Target Heart Rate Range (THRR) Yes       Intervention Provide education and explanation of THRR including how the numbers were predicted and where they are located for reference       Expected Outcomes Short Term: Able to state/look up THRR;Short Term: Able to use daily as guideline for intensity in rehab;Long Term: Able to use THRR to govern intensity when exercising independently       Understanding of Exercise Prescription Yes       Intervention Provide education, explanation, and written materials on patient's individual exercise prescription       Expected  Outcomes Short Term: Able to explain program exercise prescription;Long Term: Able to explain home exercise prescription to exercise independently                Exercise Goals Re-Evaluation :   Discharge Exercise Prescription (Final Exercise Prescription Changes):   Nutrition:  Target Goals: Understanding of nutrition guidelines, daily intake of sodium 1500mg , cholesterol 200mg , calories 30% from fat and 7% or less from saturated fats, daily to have 5 or more servings of fruits and vegetables.  Biometrics:  Pre Biometrics - 12/10/23 1410       Pre Biometrics   Waist Circumference 42.5 inches    Hip Circumference 40 inches    Waist to Hip Ratio 1.06 %    Triceps Skinfold 34 mm    % Body Fat 46.5 %    Grip Strength 20 kg    Flexibility 12.5 in    Single Leg Stand 3 seconds              Nutrition Therapy Plan and Nutrition Goals:   Nutrition Assessments:  MEDIFICTS Score Key: >=70 Need to make dietary changes  40-70 Heart Healthy Diet <= 40 Therapeutic Level  Cholesterol Diet    Picture Your Plate Scores: <16 Unhealthy dietary pattern with much room for improvement. 41-50 Dietary pattern unlikely to meet recommendations for good health and room for improvement. 51-60 More healthful dietary pattern, with some room for improvement.  >60 Healthy dietary pattern, although there may be some specific behaviors that could be improved.    Nutrition Goals Re-Evaluation:   Nutrition Goals Re-Evaluation:   Nutrition Goals Discharge (Final Nutrition Goals Re-Evaluation):   Psychosocial: Target Goals: Acknowledge presence or absence of significant depression and/or stress, maximize coping skills, provide positive support system. Participant is able to verbalize types and ability to use techniques and skills needed for reducing stress and depression.  Initial Review & Psychosocial Screening:  Initial Psych Review & Screening - 12/10/23 1413       Initial Review   Current issues with None Identified      Family Dynamics   Good Support System? Yes   Etsuko has her friends for support     Barriers   Psychosocial barriers to participate in program There are no identifiable barriers or psychosocial needs.      Screening Interventions   Interventions Encouraged to exercise;Provide feedback about the scores to participant    Expected Outcomes Short Term goal: Identification and review with participant of any Quality of Life or Depression concerns found by scoring the questionnaire.;Long Term goal: The participant improves quality of Life and PHQ9 Scores as seen by post scores and/or verbalization of changes             Quality of Life Scores:  Quality of Life - 12/10/23 1547       Quality of Life   Select Quality of Life      Quality of Life Scores   Health/Function Pre 20.57 %    Socioeconomic Pre 30 %    Psych/Spiritual Pre 29.14 %    Family Pre 22.5 %    GLOBAL Pre 24.4 %            Scores of 19 and below usually indicate a  poorer quality of life in these areas.  A difference of  2-3 points is a clinically meaningful difference.  A difference of 2-3 points in the total score of the Quality of Life Index has been associated with significant improvement  in overall quality of life, self-image, physical symptoms, and general health in studies assessing change in quality of life.  PHQ-9: Review Flowsheet  More data exists      12/10/2023 06/11/2022 11/01/2021 10/19/2021 03/02/2018  Depression screen PHQ 2/9  Decreased Interest 0 0 0 0 0  Down, Depressed, Hopeless 0 0 0 0 0  PHQ - 2 Score 0 0 0 0 0  Altered sleeping 0 - - - -  Tired, decreased energy 1 - - - -  Change in appetite 1 - - - -  Feeling bad or failure about yourself  0 - - - -  Trouble concentrating 0 - - - -  Moving slowly or fidgety/restless 0 - - - -  Suicidal thoughts 0 - - - -  PHQ-9 Score 2 - - - -  Difficult doing work/chores Not difficult at all - - - -   Interpretation of Total Score  Total Score Depression Severity:  1-4 = Minimal depression, 5-9 = Mild depression, 10-14 = Moderate depression, 15-19 = Moderately severe depression, 20-27 = Severe depression   Psychosocial Evaluation and Intervention:   Psychosocial Re-Evaluation:   Psychosocial Discharge (Final Psychosocial Re-Evaluation):   Vocational Rehabilitation: Provide vocational rehab assistance to qualifying candidates.   Vocational Rehab Evaluation & Intervention:  Vocational Rehab - 12/10/23 1414       Initial Vocational Rehab Evaluation & Intervention   Assessment shows need for Vocational Rehabilitation No   Shanaia is retired            Education: Education Goals: Education classes will be provided on a weekly basis, covering required topics. Participant will state understanding/return demonstration of topics presented.     Core Videos: Exercise    Move It!  Clinical staff conducted group or individual video education with verbal and written material and  guidebook.  Patient learns the recommended Pritikin exercise program. Exercise with the goal of living a long, healthy life. Some of the health benefits of exercise include controlled diabetes, healthier blood pressure levels, improved cholesterol levels, improved heart and lung capacity, improved sleep, and better body composition. Everyone should speak with their doctor before starting or changing an exercise routine.  Biomechanical Limitations Clinical staff conducted group or individual video education with verbal and written material and guidebook.  Patient learns how biomechanical limitations can impact exercise and how we can mitigate and possibly overcome limitations to have an impactful and balanced exercise routine.  Body Composition Clinical staff conducted group or individual video education with verbal and written material and guidebook.  Patient learns that body composition (ratio of muscle mass to fat mass) is a key component to assessing overall fitness, rather than body weight alone. Increased fat mass, especially visceral belly fat, can put [place] at increased risk for metabolic syndrome, type 2 diabetes, heart disease, and even death. It is recommended to combine diet and exercise (cardiovascular and resistance training) to improve your body composition. Seek guidance from your physician and exercise physiologist before implementing an exercise routine.  Exercise Action Plan Clinical staff conducted group or individual video education with verbal and written material and guidebook.  Patient learns the recommended strategies to achieve and enjoy long-term exercise adherence, including variety, self-motivation, self-efficacy, and positive decision making. Benefits of exercise include fitness, good health, weight management, more energy, better sleep, less stress, and overall well-being.  Medical   Heart Disease Risk Reduction Clinical staff conducted group or individual video education  with verbal and written material and guidebook.  Patient learns our heart is our most vital organ as it circulates oxygen, nutrients, white blood cells, and hormones throughout the entire body, and carries waste away. Data supports a plant-based eating plan like the Pritikin Program for its effectiveness in slowing progression of and reversing heart disease. The video provides a number of recommendations to address heart disease.   Metabolic Syndrome and Belly Fat  Clinical staff conducted group or individual video education with verbal and written material and guidebook.  Patient learns what metabolic syndrome is, how it leads to heart disease, and how one can reverse it and keep it from coming back. You have metabolic syndrome if you have 3 of the following 5 criteria: abdominal obesity, high blood pressure, high triglycerides, low HDL cholesterol, and high blood sugar.  Hypertension and Heart Disease Clinical staff conducted group or individual video education with verbal and written material and guidebook.  Patient learns that high blood pressure, or hypertension, is very common in the [place]. Hypertension is largely due to excessive salt intake, but other important risk factors include being overweight, physical inactivity, drinking too much alcohol, smoking, and not eating enough potassium from fruits and vegetables. High blood pressure is a leading risk factor for heart attack, stroke, congestive heart failure, dementia, kidney failure, and premature death. Long-term effects of excessive salt intake include stiffening of the arteries and thickening of heart muscle and organ damage. Recommendations include ways to reduce hypertension and the risk of heart disease.  Diseases of Our Time - Focusing on Diabetes Clinical staff conducted group or individual video education with verbal and written material and guidebook.  Patient learns why the best way to stop diseases of our time is prevention,  through food and other lifestyle changes. Medicine (such as prescription pills and surgeries) is often only a Band-Aid on the problem, not a long-term solution. Most common diseases of our time include obesity, type 2 diabetes, hypertension, heart disease, and cancer. The Pritikin Program is recommended and has been proven to help reduce, reverse, and/or prevent the damaging effects of metabolic syndrome.  Nutrition   Overview of the Pritikin Eating Plan  Clinical staff conducted group or individual video education with verbal and written material and guidebook.  Patient learns about the Pritikin Eating Plan for disease risk reduction. The Pritikin Eating Plan emphasizes a wide variety of unrefined, minimally-processed carbohydrates, like fruits, vegetables, whole grains, and legumes. Go, Caution, and Stop food choices are explained. Plant-based and lean animal proteins are emphasized. Rationale provided for low sodium intake for blood pressure control, low added sugars for blood sugar stabilization, and low added fats and oils for coronary artery disease risk reduction and weight management.  Calorie Density  Clinical staff conducted group or individual video education with verbal and written material and guidebook.  Patient learns about calorie density and how it impacts the Pritikin Eating Plan. Knowing the characteristics of the food you choose will help you decide whether those foods will lead to weight gain or weight loss, and whether you want to consume more or less of them. Weight loss is usually a side effect of the Pritikin Eating Plan because of its focus on low calorie-dense foods.  Label Reading  Clinical staff conducted group or individual video education with verbal and written material and guidebook.  Patient learns about the Pritikin recommended label reading guidelines and corresponding recommendations regarding calorie density, added sugars, sodium content, and whole grains.  Dining  Out - Part 1  Clinical staff conducted  group or individual video education with verbal and written material and guidebook.  Patient learns that restaurant meals can be sabotaging because they can be so high in calories, fat, sodium, and/or sugar. Patient learns recommended strategies on how to positively address this and avoid unhealthy pitfalls.  Facts on Fats  Clinical staff conducted group or individual video education with verbal and written material and guidebook.  Patient learns that lifestyle modifications can be just as effective, if not more so, as many medications for lowering your risk of heart disease. A Pritikin lifestyle can help to reduce your risk of inflammation and atherosclerosis (cholesterol build-up, or plaque, in the artery walls). Lifestyle interventions such as dietary choices and physical activity address the cause of atherosclerosis. A review of the types of fats and their impact on blood cholesterol levels, along with dietary recommendations to reduce fat intake is also included.  Nutrition Action Plan  Clinical staff conducted group or individual video education with verbal and written material and guidebook.  Patient learns how to incorporate Pritikin recommendations into their lifestyle. Recommendations include planning and keeping personal health goals in mind as an important part of their success.  Healthy Mind-Set    Healthy Minds, Bodies, Hearts  Clinical staff conducted group or individual video education with verbal and written material and guidebook.  Patient learns how to identify when they are stressed. Video will discuss the impact of that stress, as well as the many benefits of stress management. Patient will also be introduced to stress management techniques. The way we think, act, and feel has an impact on our hearts.  How Our Thoughts Can Heal Our Hearts  Clinical staff conducted group or individual video education with verbal and written material and  guidebook.  Patient learns that negative thoughts can cause depression and anxiety. This can result in negative lifestyle behavior and serious health problems. Cognitive behavioral therapy is an effective method to help control our thoughts in order to change and improve our emotional outlook.  Additional Videos:  Exercise    Improving Performance  Clinical staff conducted group or individual video education with verbal and written material and guidebook.  Patient learns to use a non-linear approach by alternating intensity levels and lengths of time spent exercising to help burn more calories and lose more body fat. Cardiovascular exercise helps improve heart health, metabolism, hormonal balance, blood sugar control, and recovery from fatigue. Resistance training improves strength, endurance, balance, coordination, reaction time, metabolism, and muscle mass. Flexibility exercise improves circulation, posture, and balance. Seek guidance from your physician and exercise physiologist before implementing an exercise routine and learn your capabilities and proper form for all exercise.  Introduction to Yoga  Clinical staff conducted group or individual video education with verbal and written material and guidebook.  Patient learns about yoga, a discipline of the coming together of mind, breath, and body. The benefits of yoga include improved flexibility, improved range of motion, better posture and core strength, increased lung function, weight loss, and positive self-image. Yoga's heart health benefits include lowered blood pressure, healthier heart rate, decreased cholesterol and triglyceride levels, improved immune function, and reduced stress. Seek guidance from your physician and exercise physiologist before implementing an exercise routine and learn your capabilities and proper form for all exercise.  Medical   Aging: Enhancing Your Quality of Life  Clinical staff conducted group or individual  video education with verbal and written material and guidebook.  Patient learns key strategies and recommendations to stay in good physical  health and enhance quality of life, such as prevention strategies, having an advocate, securing a Health Care Proxy and Power of Attorney, and keeping a list of medications and system for tracking them. It also discusses how to avoid risk for bone loss.  Biology of Weight Control  Clinical staff conducted group or individual video education with verbal and written material and guidebook.  Patient learns that weight gain occurs because we consume more calories than we burn (eating more, moving less). Even if your body weight is normal, you may have higher ratios of fat compared to muscle mass. Too much body fat puts you at increased risk for cardiovascular disease, heart attack, stroke, type 2 diabetes, and obesity-related cancers. In addition to exercise, following the Pritikin Eating Plan can help reduce your risk.  Decoding Lab Results  Clinical staff conducted group or individual video education with verbal and written material and guidebook.  Patient learns that lab test reflects one measurement whose values change over time and are influenced by many factors, including medication, stress, sleep, exercise, food, hydration, pre-existing medical conditions, and more. It is recommended to use the knowledge from this video to become more involved with your lab results and evaluate your numbers to speak with your doctor.   Diseases of Our Time - Overview  Clinical staff conducted group or individual video education with verbal and written material and guidebook.  Patient learns that according to the CDC, 50% to 70% of chronic diseases (such as obesity, type 2 diabetes, elevated lipids, hypertension, and heart disease) are avoidable through lifestyle improvements including healthier food choices, listening to satiety cues, and increased physical activity.  Sleep  Disorders Clinical staff conducted group or individual video education with verbal and written material and guidebook.  Patient learns how good quality and duration of sleep are important to overall health and well-being. Patient also learns about sleep disorders and how they impact health along with recommendations to address them, including discussing with a physician.  Nutrition  Dining Out - Part 2 Clinical staff conducted group or individual video education with verbal and written material and guidebook.  Patient learns how to plan ahead and communicate in order to maximize their dining experience in a healthy and nutritious manner. Included are recommended food choices based on the type of restaurant the patient is visiting.   Fueling a Banker conducted group or individual video education with verbal and written material and guidebook.  There is a strong connection between our food choices and our health. Diseases like obesity and type 2 diabetes are very prevalent and are in large-part due to lifestyle choices. The Pritikin Eating Plan provides plenty of food and hunger-curbing satisfaction. It is easy to follow, affordable, and helps reduce health risks.  Menu Workshop  Clinical staff conducted group or individual video education with verbal and written material and guidebook.  Patient learns that restaurant meals can sabotage health goals because they are often packed with calories, fat, sodium, and sugar. Recommendations include strategies to plan ahead and to communicate with the manager, chef, or server to help order a healthier meal.  Planning Your Eating Strategy  Clinical staff conducted group or individual video education with verbal and written material and guidebook.  Patient learns about the Pritikin Eating Plan and its benefit of reducing the risk of disease. The Pritikin Eating Plan does not focus on calories. Instead, it emphasizes high-quality,  nutrient-rich foods. By knowing the characteristics of the foods, we choose, we  can determine their calorie density and make informed decisions.  Targeting Your Nutrition Priorities  Clinical staff conducted group or individual video education with verbal and written material and guidebook.  Patient learns that lifestyle habits have a tremendous impact on disease risk and progression. This video provides eating and physical activity recommendations based on your personal health goals, such as reducing LDL cholesterol, losing weight, preventing or controlling type 2 diabetes, and reducing high blood pressure.  Vitamins and Minerals  Clinical staff conducted group or individual video education with verbal and written material and guidebook.  Patient learns different ways to obtain key vitamins and minerals, including through a recommended healthy diet. It is important to discuss all supplements you take with your doctor.   Healthy Mind-Set    Smoking Cessation  Clinical staff conducted group or individual video education with verbal and written material and guidebook.  Patient learns that cigarette smoking and tobacco addiction pose a serious health risk which affects millions of people. Stopping smoking will significantly reduce the risk of heart disease, lung disease, and many forms of cancer. Recommended strategies for quitting are covered, including working with your doctor to develop a successful plan.  Culinary   Becoming a Set designer conducted group or individual video education with verbal and written material and guidebook.  Patient learns that cooking at home can be healthy, cost-effective, quick, and puts them in control. Keys to cooking healthy recipes will include looking at your recipe, assessing your equipment needs, planning ahead, making it simple, choosing cost-effective seasonal ingredients, and limiting the use of added fats, salts, and sugars.  Cooking -  Breakfast and Snacks  Clinical staff conducted group or individual video education with verbal and written material and guidebook.  Patient learns how important breakfast is to satiety and nutrition through the entire day. Recommendations include key foods to eat during breakfast to help stabilize blood sugar levels and to prevent overeating at meals later in the day. Planning ahead is also a key component.  Cooking - Educational psychologist conducted group or individual video education with verbal and written material and guidebook.  Patient learns eating strategies to improve overall health, including an approach to cook more at home. Recommendations include thinking of animal protein as a side on your plate rather than center stage and focusing instead on lower calorie dense options like vegetables, fruits, whole grains, and plant-based proteins, such as beans. Making sauces in large quantities to freeze for later and leaving the skin on your vegetables are also recommended to maximize your experience.  Cooking - Healthy Salads and Dressing Clinical staff conducted group or individual video education with verbal and written material and guidebook.  Patient learns that vegetables, fruits, whole grains, and legumes are the foundations of the Pritikin Eating Plan. Recommendations include how to incorporate each of these in flavorful and healthy salads, and how to create homemade salad dressings. Proper handling of ingredients is also covered. Cooking - Soups and [employer] - Soups and Desserts Clinical staff conducted group or individual video education with verbal and written material and guidebook.  Patient learns that Pritikin soups and desserts make for easy, nutritious, and delicious snacks and meal components that are low in sodium, fat, sugar, and calorie density, while high in vitamins, minerals, and filling fiber. Recommendations include simple and healthy ideas for soups and  desserts.   Overview     The Pritikin Solution Program Overview Clinical staff conducted group  or individual video education with verbal and written material and guidebook.  Patient learns that the results of the Pritikin Program have been documented in more than 100 articles published in peer-reviewed journals, and the benefits include reducing risk factors for (and, in some cases, even reversing) high cholesterol, high blood pressure, type 2 diabetes, obesity, and more! An overview of the three key pillars of the Pritikin Program will be covered: eating well, doing regular exercise, and having a healthy mind-set.  WORKSHOPS  Exercise: Exercise Basics: Building Your Action Plan Clinical staff led group instruction and group discussion with PowerPoint presentation and patient guidebook. To enhance the learning environment the use of posters, models and videos may be added. At the conclusion of this workshop, patients will comprehend the difference between physical activity and exercise, as well as the benefits of incorporating both, into their routine. Patients will understand the FITT (Frequency, Intensity, Time, and Type) principle and how to use it to build an exercise action plan. In addition, safety concerns and other considerations for exercise and cardiac rehab will be addressed by the presenter. The purpose of this lesson is to promote a comprehensive and effective weekly exercise routine in order to improve patients' overall level of fitness.   Managing Heart Disease: Your Path to a Healthier Heart Clinical staff led group instruction and group discussion with PowerPoint presentation and patient guidebook. To enhance the learning environment the use of posters, models and videos may be added.At the conclusion of this workshop, patients will understand the anatomy and physiology of the heart. Additionally, they will understand how Pritikin's three pillars impact the risk factors, the  progression, and the management of heart disease.  The purpose of this lesson is to provide a high-level overview of the heart, heart disease, and how the Pritikin lifestyle positively impacts risk factors.  Exercise Biomechanics Clinical staff led group instruction and group discussion with PowerPoint presentation and patient guidebook. To enhance the learning environment the use of posters, models and videos may be added. Patients will learn how the structural parts of their bodies function and how these functions impact their daily activities, movement, and exercise. Patients will learn how to promote a neutral spine, learn how to manage pain, and identify ways to improve their physical movement in order to promote healthy living. The purpose of this lesson is to expose patients to common physical limitations that impact physical activity. Participants will learn practical ways to adapt and manage aches and pains, and to minimize their effect on regular exercise. Patients will learn how to maintain good posture while sitting, walking, and lifting.  Balance Training and Fall Prevention  Clinical staff led group instruction and group discussion with PowerPoint presentation and patient guidebook. To enhance the learning environment the use of posters, models and videos may be added. At the conclusion of this workshop, patients will understand the importance of their sensorimotor skills (vision, proprioception, and the vestibular system) in maintaining their ability to balance as they age. Patients will apply a variety of balancing exercises that are appropriate for their current level of function. Patients will understand the common causes for poor balance, possible solutions to these problems, and ways to modify their physical environment in order to minimize their fall risk. The purpose of this lesson is to teach patients about the importance of maintaining balance as they age and ways to  minimize their risk of falling.  WORKSHOPS   Nutrition:  Fueling a Ship broker led group instruction and  group discussion with PowerPoint presentation and patient guidebook. To enhance the learning environment the use of posters, models and videos may be added. Patients will review the foundational principles of the Pritikin Eating Plan and understand what constitutes a serving size in each of the food groups. Patients will also learn Pritikin-friendly foods that are better choices when away from home and review make-ahead meal and snack options. Calorie density will be reviewed and applied to three nutrition priorities: weight maintenance, weight loss, and weight gain. The purpose of this lesson is to reinforce (in a group setting) the key concepts around what patients are recommended to eat and how to apply these guidelines when away from home by planning and selecting Pritikin-friendly options. Patients will understand how calorie density may be adjusted for different weight management goals.  Mindful Eating  Clinical staff led group instruction and group discussion with PowerPoint presentation and patient guidebook. To enhance the learning environment the use of posters, models and videos may be added. Patients will briefly review the concepts of the Pritikin Eating Plan and the importance of low-calorie dense foods. The concept of mindful eating will be introduced as well as the importance of paying attention to internal hunger signals. Triggers for non-hunger eating and techniques for dealing with triggers will be explored. The purpose of this lesson is to provide patients with the opportunity to review the basic principles of the Pritikin Eating Plan, discuss the value of eating mindfully and how to measure internal cues of hunger and fullness using the Hunger Scale. Patients will also discuss reasons for non-hunger eating and learn strategies to use for controlling emotional  eating.  Targeting Your Nutrition Priorities Clinical staff led group instruction and group discussion with PowerPoint presentation and patient guidebook. To enhance the learning environment the use of posters, models and videos may be added. Patients will learn how to determine their genetic susceptibility to disease by reviewing their family history. Patients will gain insight into the importance of diet as part of an overall healthy lifestyle in mitigating the impact of genetics and other environmental insults. The purpose of this lesson is to provide patients with the opportunity to assess their personal nutrition priorities by looking at their family history, their own health history and current risk factors. Patients will also be able to discuss ways of prioritizing and modifying the Pritikin Eating Plan for their highest risk areas  Menu  Clinical staff led group instruction and group discussion with PowerPoint presentation and patient guidebook. To enhance the learning environment the use of posters, models and videos may be added. Using menus brought in from [employer], or printed from [employer], patients will apply the Pritikin dining out guidelines that were presented in the [employer] video. Patients will also be able to practice these guidelines in a variety of provided scenarios. The purpose of this lesson is to provide patients with the opportunity to practice hands-on learning of the Pritikin Dining Out guidelines with actual menus and practice scenarios.  Label Reading Clinical staff led group instruction and group discussion with PowerPoint presentation and patient guidebook. To enhance the learning environment the use of posters, models and videos may be added. Patients will review and discuss the Pritikin label reading guidelines presented in Pritikin's Label Reading Educational series video. Using fool labels brought in from local grocery stores and  markets, patients will apply the label reading guidelines and determine if the packaged food meet the Pritikin guidelines. The purpose of this lesson  is to provide patients with the opportunity to review, discuss, and practice hands-on learning of the Pritikin Label Reading guidelines with actual packaged food labels. Cooking School  Pritikin's [employer] are designed to teach patients ways to prepare quick, simple, and affordable recipes at home. The importance of nutrition's role in chronic disease risk reduction is reflected in its emphasis in the overall Pritikin program. By learning how to prepare essential core Pritikin Eating Plan recipes, patients will increase control over what they eat; be able to customize the flavor of foods without the use of added salt, sugar, or fat; and improve the quality of the food they consume. By learning a set of core recipes which are easily assembled, quickly prepared, and affordable, patients are more likely to prepare more healthy foods at home. These workshops focus on convenient breakfasts, simple entres, side dishes, and desserts which can be prepared with minimal effort and are consistent with nutrition recommendations for cardiovascular risk reduction. Cooking [employer] are taught by a Armed forces logistics/support/administrative officer (RD) who has been trained by the [employer]. The chef or RD has a clear understanding of the importance of minimizing - if not completely eliminating - added fat, sugar, and sodium in recipes. Throughout the series of Cooking School Workshop sessions, patients will learn about healthy ingredients and efficient methods of cooking to build confidence in their capability to prepare    Cooking School weekly topics:  Adding Flavor- Sodium-Free  Fast and Healthy Breakfasts  Powerhouse Plant-Based Proteins  Satisfying Salads and Dressings  Simple Sides and Sauces  International Cuisine-Spotlight on the [employer]  Zones  Delicious Desserts  Savory Soups  [employer] - Meals in a Astronomer Appetizers and Snacks  Comforting Weekend Breakfasts  One-Pot Wonders   Fast Evening Meals  Landscape architect Your Pritikin Plate  WORKSHOPS   Healthy Mindset (Psychosocial):  Focused Goals, Sustainable Changes Clinical staff led group instruction and group discussion with PowerPoint presentation and patient guidebook. To enhance the learning environment the use of posters, models and videos may be added. Patients will be able to apply effective goal setting strategies to establish at least one personal goal, and then take consistent, meaningful action toward that goal. They will learn to identify common barriers to achieving personal goals and develop strategies to overcome them. Patients will also gain an understanding of how our mind-set can impact our ability to achieve goals and the importance of cultivating a positive and growth-oriented mind-set. The purpose of this lesson is to provide patients with a deeper understanding of how to set and achieve personal goals, as well as the tools and strategies needed to overcome common obstacles which may arise along the way.  From Head to Heart: The Power of a Healthy Outlook  Clinical staff led group instruction and group discussion with PowerPoint presentation and patient guidebook. To enhance the learning environment the use of posters, models and videos may be added. Patients will be able to recognize and describe the impact of emotions and mood on physical health. They will discover the importance of self-care and explore self-care practices which may work for them. Patients will also learn how to utilize the 4 C's to cultivate a healthier outlook and better manage stress and challenges. The purpose of this lesson is to demonstrate to patients how a healthy outlook is an essential part of maintaining good health, especially as they continue their  cardiac rehab journey.  Healthy Sleep for a  Healthy Heart Clinical staff led group instruction and group discussion with PowerPoint presentation and patient guidebook. To enhance the learning environment the use of posters, models and videos may be added. At the conclusion of this workshop, patients will be able to demonstrate knowledge of the importance of sleep to overall health, well-being, and quality of life. They will understand the symptoms of, and treatments for, common sleep disorders. Patients will also be able to identify daytime and nighttime behaviors which impact sleep, and they will be able to apply these tools to help manage sleep-related challenges. The purpose of this lesson is to provide patients with a general overview of sleep and outline the importance of quality sleep. Patients will learn about a few of the most common sleep disorders. Patients will also be introduced to the concept of "sleep hygiene," and discover ways to self-manage certain sleeping problems through simple daily behavior changes. Finally, the workshop will motivate patients by clarifying the links between quality sleep and their goals of heart-healthy living.   Recognizing and Reducing Stress Clinical staff led group instruction and group discussion with PowerPoint presentation and patient guidebook. To enhance the learning environment the use of posters, models and videos may be added. At the conclusion of this workshop, patients will be able to understand the types of stress reactions, differentiate between acute and chronic stress, and recognize the impact that chronic stress has on their health. They will also be able to apply different coping mechanisms, such as reframing negative self-talk. Patients will have the opportunity to practice a variety of stress management techniques, such as deep abdominal breathing, progressive muscle relaxation, and/or guided imagery.  The purpose of this lesson is to educate  patients on the role of stress in their lives and to provide healthy techniques for coping with it.  Learning Barriers/Preferences:  Learning Barriers/Preferences - 12/10/23 1414       Learning Barriers/Preferences   Learning Barriers Hearing;Sight   reading glasses and hearing aids   Learning Preferences Audio;Computer/Internet;Group Instruction;Individual Instruction;Pictoral;Skilled Demonstration;Verbal Instruction;Video;Written Material             Education Topics:  Knowledge Questionnaire Score:  Knowledge Questionnaire Score - 12/10/23 1419       Knowledge Questionnaire Score   Pre Score 24/24             Core Components/Risk Factors/Patient Goals at Admission:  Personal Goals and Risk Factors at Admission - 12/10/23 1414       Core Components/Risk Factors/Patient Goals on Admission    Weight Management Yes;Obesity;Weight Loss    Intervention Weight Management: Develop a combined nutrition and exercise program designed to reach desired caloric intake, while maintaining appropriate intake of nutrient and fiber, sodium and fats, and appropriate energy expenditure required for the weight goal.;Weight Management: Provide education and appropriate resources to help participant work on and attain dietary goals.;Weight Management/Obesity: Establish reasonable short term and long term weight goals.;Obesity: Provide education and appropriate resources to help participant work on and attain dietary goals.    Expected Outcomes Short Term: Continue to assess and modify interventions until short term weight is achieved;Long Term: Adherence to nutrition and physical activity/exercise program aimed toward attainment of established weight goal;Weight Loss: Understanding of general recommendations for a balanced deficit meal plan, which promotes 1-2 lb weight loss per week and includes a negative energy balance of [id number] kcal/d;Understanding recommendations for meals to include 15-35%  energy as protein, 25-35% energy from fat, 35-60% energy from carbohydrates, less than 200mg  of dietary cholesterol,  20-35 gm of total fiber daily;Understanding of distribution of calorie intake throughout the day with the consumption of 4-5 meals/snacks    Hypertension Yes    Intervention Provide education on lifestyle modifcations including regular physical activity/exercise, weight management, moderate sodium restriction and increased consumption of fresh fruit, vegetables, and low fat dairy, alcohol moderation, and smoking cessation.;Monitor prescription use compliance.    Expected Outcomes Short Term: Continued assessment and intervention until BP is < 140/44mm HG in hypertensive participants. < 130/85mm HG in hypertensive participants with diabetes, heart failure or chronic kidney disease.;Long Term: Maintenance of blood pressure at goal levels.    Lipids Yes    Intervention Provide education and support for participant on nutrition & aerobic/resistive exercise along with prescribed medications to achieve LDL 70mg , HDL >40mg .    Expected Outcomes Short Term: Participant states understanding of desired cholesterol values and is compliant with medications prescribed. Participant is following exercise prescription and nutrition guidelines.;Long Term: Cholesterol controlled with medications as prescribed, with individualized exercise RX and with personalized nutrition plan. Value goals: LDL < 70mg , HDL > 40 mg.             Core Components/Risk Factors/Patient Goals Review:    Core Components/Risk Factors/Patient Goals at Discharge (Final Review):    ITP Comments:  ITP Comments     Row Name 12/10/23 1410           ITP Comments Dr. Armanda Magic medical director. Introduction to pritikin education/intensive cardiac rehab. Initial orientation packet reviewed with patient.                Comments: Participant attended orientation for the cardiac rehabilitation program on  12/10/2023   to perform initial intake and exercise walk test. Patient introduced to the [facility] education and orientation packet was reviewed. Completed 6-minute walk test, measurements, initial ITP, and exercise prescription. Vital signs stable. Telemetry-normal sinus rhythm with PVCs, asymptomatic.   Service time was from 1312 to 1516.   Jonna Coup, MS, ACSM-CEP 12/10/2023 3:56 PM

## 2023-12-15 ENCOUNTER — Encounter (HOSPITAL_COMMUNITY)
Admission: RE | Admit: 2023-12-15 | Discharge: 2023-12-15 | Disposition: A | Discharge status: Home / Self Care | Source: Ambulatory Visit | Attending: Cardiology

## 2023-12-15 DIAGNOSIS — Z951 Presence of aortocoronary bypass graft: Secondary | ICD-10-CM

## 2023-12-15 NOTE — Progress Notes (Signed)
 Daily Session Note  Patient Details  Name: Megan Pham MRN: [id number] Date of Birth: 22-Jan-1946 Referring Provider:   Flowsheet Row INTENSIVE CARDIAC REHAB ORIENT from 12/10/2023 in [facility]  Referring Provider Tessa Lerner, [place]       Encounter Date: 12/15/2023  Check In:  Session Check In - 12/15/23 1439       Check-In   Supervising physician immediately available to respond to emergencies [facility] MD immediately available    Physician(s) Rise Paganini, NP    Location [facility]    Staff Present Cristy Hilts, MS, ACSM-CEP, Exercise Physiologist;Jetta Dan Humphreys BS, ACSM-CEP, Exercise Physiologist;David Makemson, MS, ACSM-CEP, CCRP, Exercise Physiologist;Surie Suchocki, RN, BSN;Johnny Porter, MS, Exercise Physiologist    Virtual Visit No    Medication changes reported     No    Fall or balance concerns reported    No    Tobacco Cessation No Change    Warm-up and Cool-down Performed as group-led instruction    Resistance Training Performed Yes    VAD Patient? No    PAD/SET Patient? No      Pain Assessment   Currently in Pain? No/denies    Pain Score 0-No pain    Multiple Pain Sites No             Capillary Blood Glucose: No results found for this or any previous visit (from the past 24 hours).   Exercise Prescription Changes - 12/15/23 1631       Response to Exercise   Blood Pressure (Admit) 118/70    Blood Pressure (Exercise) 150/68    Blood Pressure (Exit) 108/61    Heart Rate (Admit) 64 bpm    Heart Rate (Exercise) 88 bpm    Heart Rate (Exit) 73 bpm    Rating of Perceived Exertion (Exercise) 11    Perceived Dyspnea (Exercise) 0    Symptoms None    Comments Pt first day in the [facility] program    Duration Progress to 30 minutes of  aerobic without signs/symptoms of physical distress    Intensity THRR unchanged      Progression   Progression Continue to progress workloads to  maintain intensity without signs/symptoms of physical distress.    Average METs 1.75      Resistance Training   Training Prescription Yes    Weight 2    Reps 10-15    Time 10 Minutes      Recumbant Bike   Level 1    RPM 48    Watts 12    Minutes 15    METs 1.9      NuStep   Level 1    SPM 72    Minutes 15    METs 1.6             Social History   Tobacco Use  Smoking Status Former   Current packs/day: 0.00   Types: Cigarettes   Quit date: 09/09/2006   Years since quitting: 17.2  Smokeless Tobacco Never    Goals Met:  {[facility] GOALS MET:[phone]}  Goals Unmet:  {[facility] GOALS UNMET:[phone]}  Comments: ***   {[facility] MEDICAL [phone]}

## 2023-12-17 ENCOUNTER — Encounter (HOSPITAL_COMMUNITY)
Admission: RE | Admit: 2023-12-17 | Discharge: 2023-12-17 | Disposition: A | Discharge status: Home / Self Care | Source: Ambulatory Visit | Attending: Cardiology | Admitting: Cardiology

## 2023-12-17 DIAGNOSIS — Z951 Presence of aortocoronary bypass graft: Secondary | ICD-10-CM | POA: Diagnosis not present

## 2023-12-19 ENCOUNTER — Encounter (HOSPITAL_COMMUNITY)
Admission: RE | Admit: 2023-12-19 | Discharge: 2023-12-19 | Disposition: A | Discharge status: Home / Self Care | Source: Ambulatory Visit | Attending: Cardiology | Admitting: Cardiology

## 2023-12-19 DIAGNOSIS — Z951 Presence of aortocoronary bypass graft: Secondary | ICD-10-CM | POA: Diagnosis not present

## 2023-12-22 ENCOUNTER — Encounter (HOSPITAL_COMMUNITY)
Admission: RE | Admit: 2023-12-22 | Discharge: 2023-12-22 | Disposition: A | Discharge status: Home / Self Care | Source: Ambulatory Visit | Attending: Cardiology

## 2023-12-22 DIAGNOSIS — Z951 Presence of aortocoronary bypass graft: Secondary | ICD-10-CM

## 2023-12-24 ENCOUNTER — Encounter (HOSPITAL_COMMUNITY)
Admission: RE | Admit: 2023-12-24 | Discharge: 2023-12-24 | Disposition: A | Discharge status: Home / Self Care | Source: Ambulatory Visit | Attending: Cardiology | Admitting: Cardiology

## 2023-12-24 DIAGNOSIS — Z951 Presence of aortocoronary bypass graft: Secondary | ICD-10-CM

## 2023-12-26 ENCOUNTER — Encounter (HOSPITAL_COMMUNITY)
Admission: RE | Admit: 2023-12-26 | Discharge: 2023-12-26 | Disposition: A | Discharge status: Home / Self Care | Source: Ambulatory Visit | Attending: Cardiology | Admitting: Cardiology

## 2023-12-26 DIAGNOSIS — Z951 Presence of aortocoronary bypass graft: Secondary | ICD-10-CM

## 2023-12-29 ENCOUNTER — Encounter (HOSPITAL_COMMUNITY)
Admission: RE | Admit: 2023-12-29 | Discharge: 2023-12-29 | Disposition: A | Discharge status: Home / Self Care | Source: Ambulatory Visit | Attending: Cardiology | Admitting: Cardiology

## 2023-12-29 DIAGNOSIS — Z951 Presence of aortocoronary bypass graft: Secondary | ICD-10-CM

## 2023-12-31 ENCOUNTER — Encounter (HOSPITAL_COMMUNITY)
Admission: RE | Admit: 2023-12-31 | Discharge: 2023-12-31 | Disposition: A | Discharge status: Home / Self Care | Source: Ambulatory Visit | Attending: Cardiology | Admitting: Cardiology

## 2023-12-31 DIAGNOSIS — Z951 Presence of aortocoronary bypass graft: Secondary | ICD-10-CM | POA: Diagnosis not present

## 2023-12-31 NOTE — Progress Notes (Signed)
 Cardiac Individual Treatment Plan  Patient Details  Name: Megan Pham MRN: [id number] Date of Birth: October 14, 1945 Referring Provider:   Flowsheet Row INTENSIVE CARDIAC REHAB ORIENT from 12/10/2023 in [facility]  Referring Provider Olinda Bertrand, [place]       Initial Encounter Date:  Flowsheet Row INTENSIVE CARDIAC REHAB ORIENT from 12/10/2023 in [facility], [facility]  Date 12/10/23       Visit Diagnosis: 10/06/23 CABG x4  Patient's Home Medications on Admission:  Current Outpatient Medications:    amiodarone  (PACERONE ) 100 MG tablet, Take 1 tablet (100 mg total) by mouth daily., Disp: , Rfl:    aspirin  EC 325 MG tablet, Take 1 tablet (325 mg total) by mouth daily. (Patient not taking: Reported on 12/10/2023), Disp: , Rfl:    [START ON 01/04/2024] aspirin  EC 81 MG tablet, Take 1 tablet (81 mg total) by mouth daily. Swallow whole., Disp: , Rfl:    diphenhydramine -acetaminophen  (TYLENOL  PM) 25-500 MG TABS tablet, Take 2 tablets by mouth at bedtime., Disp: , Rfl:    losartan  (COZAAR ) 25 MG tablet, Take 1 tablet (25 mg total) by mouth daily. HOLD is systolic blood pressure (top number) is less than 120. (Patient not taking: Reported on 12/10/2023), Disp: 30 tablet, Rfl: 3   metoprolol  succinate (TOPROL -XL) 25 MG 24 hr tablet, Take 1 tablet (25 mg total) by mouth daily., Disp: 90 tablet, Rfl: 3   Multiple Vitamins-Minerals (PRESERVISION AREDS 2 PO), Take 1 tablet by mouth 2 (two) times daily., Disp: , Rfl:    omeprazole  (PRILOSEC  OTC) 20 MG tablet, Take 1 tablet (20 mg total) by mouth daily., Disp: , Rfl:    rosuvastatin  (CRESTOR ) 40 MG tablet, Take 1 tablet (40 mg total) by mouth daily., Disp: 90 tablet, Rfl: 3   traMADol  (ULTRAM ) 50 MG tablet, Take 1 tablet (50 mg total) by mouth every 6 (six) hours as needed for moderate pain (pain score 4-6)., Disp: 28 tablet, Rfl: 0  Past Medical  History: 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   Dyslipidemia    Dyspnea    Family history of breast cancer 05/27/2022   Family history of gastric cancer 05/27/2022   HTN (hypertension)    Hyperlipidemia    Osteoarthritis    Osteopenia    Poison ivy    Pseudoptosis, unspecified laterality    Ulcer 1979   ulcerative colitis    Tobacco Use: Social History   Tobacco Use  Smoking Status Former   Current packs/day: 0.00   Types: Cigarettes   Quit date: 09/09/2006   Years since quitting: 17.3  Smokeless Tobacco Never    Labs: Review Flowsheet  More data exists      Latest Ref Rng & Units 11/01/2021 10/02/2023 10/06/2023 10/09/2023 10/24/2023  Labs for ITP Cardiac and Pulmonary Rehab  Cholestrol 100 - 199 mg/dL 409  - - 62  811   LDL (calc) 0 - 99 mg/dL 914  - - 13  47   HDL-C >39 mg/dL 78.29  - - 35  53   Trlycerides 0 - 149 mg/dL 562.1  - - 68  308   Hemoglobin A1c 4.8 - 5.6 % 6.0  6.0  - - -  PH, Arterial 7.35 - 7.45 - -  7.359  7.354  7.334  7.173  7.229  7.254  7.325  - -  PCO2 arterial 32 - 48 mmHg - - 42.4  41.7  46.9  55.4  54.7  53.1  47.3  - -  Bicarbonate 20.0 - 28.0 mmol/L - - 23.9  23.3  25.3  20.6  23.0  23.5  22.7  24.7  - -  TCO2 22 - 32 mmol/L - - 25  25  27  22  25  24  25  25  25  26  24  26  26  23  28   - -  Acid-base deficit 0.0 - 2.0 mmol/L - - 2.0  2.0  1.0  8.0  5.0  3.0  3.0  1.0  - -  O2 Saturation % - - 97  98  99  98  100  96  73  100  - -    Details       Multiple values from one day are sorted in reverse-chronological order         Capillary Blood Glucose: Lab Results  Component Value Date   GLUCAP 165 (H) 10/15/2023   GLUCAP 105 (H) 10/15/2023   GLUCAP 152 (H) 10/15/2023   GLUCAP 85 10/15/2023   GLUCAP 113 (H) 10/14/2023     Exercise Target Goals: Exercise Program Goal: Individual exercise  prescription set using results from initial 6 min walk test and THRR while considering  patient's activity barriers and safety.   Exercise Prescription Goal: Initial exercise prescription builds to 30-45 minutes a day of aerobic activity, 2-3 days per week.  Home exercise guidelines will be given to patient during program as part of exercise prescription that the participant will acknowledge.  Activity Barriers & Risk Stratification:  Activity Barriers & Cardiac Risk Stratification - 12/10/23 1412       Activity Barriers & Cardiac Risk Stratification   Activity Barriers Other (comment);Shortness of Breath;Deconditioning;Incisional Pain    Comments sternal precautions    Cardiac Risk Stratification High   <5 METs on            6 Minute Walk:  6 Minute Walk     Row Name 12/10/23 1544         6 Minute Walk   Phase Initial     Distance 1004 feet     Walk Time 6 minutes     # of Rest Breaks 0     MPH 1.9     METS 1.96     RPE 13     Perceived Dyspnea  1     VO2 Peak 6.88     Symptoms Yes (comment)     Comments SOB, resolved with rest. no pain     Resting HR 68 bpm     Resting BP 148/66     Resting Oxygen Saturation  97 %     Exercise Oxygen Saturation  during 6 min walk 96 %     Max Ex. HR 107 bpm     Max Ex. BP 168/60     2 Minute Post BP 146/64              Oxygen Initial Assessment:   Oxygen Re-Evaluation:   Oxygen Discharge (Final Oxygen Re-Evaluation):   Initial Exercise Prescription:  Initial Exercise Prescription - 12/10/23 1500       Date of Initial Exercise RX and Referring Provider   Date 12/10/23    Referring Provider Olinda Bertrand,  DO    Expected Discharge Date 03/03/24      Recumbant Bike   Level 1    RPM 50    Watts 20    Minutes 15    METs 2      NuStep   Level 1    SPM 70    Minutes 15    METs 2      Track   Laps 15    Minutes 15    METs 2      Prescription Details   Frequency (times per week) 3    Duration  Progress to 30 minutes of continuous aerobic without signs/symptoms of physical distress      Intensity   THRR 40-80% of Max Heartrate 56-113    Ratings of Perceived Exertion 11-13    Perceived Dyspnea 0-4      Progression   Progression Continue progressive overload as per policy without signs/symptoms or physical distress.      Resistance Training   Training Prescription Yes    Weight 2    Reps 10-15             Perform Capillary Blood Glucose checks as needed.  Exercise Prescription Changes:   Exercise Prescription Changes     Row Name 12/15/23 1631 12/26/23 1632           Response to Exercise   Blood Pressure (Admit) 118/70 144/84      Blood Pressure (Exercise) 150/68 160/70      Blood Pressure (Exit) 108/61 120/70      Heart Rate (Admit) 64 bpm 75 bpm      Heart Rate (Exercise) 88 bpm 109 bpm      Heart Rate (Exit) 73 bpm 80 bpm      Rating of Perceived Exertion (Exercise) 11 11      Perceived Dyspnea (Exercise) 0 0      Symptoms None None      Comments Pt first day in the [facility] ICR program Reviewed MET's and goals      Duration Progress to 30 minutes of  aerobic without signs/symptoms of physical distress Progress to 30 minutes of  aerobic without signs/symptoms of physical distress      Intensity THRR unchanged THRR unchanged        Progression   Progression Continue to progress workloads to maintain intensity without signs/symptoms of physical distress. Continue to progress workloads to maintain intensity without signs/symptoms of physical distress.      Average METs 1.75 1.95        Resistance Training   Training Prescription Yes Yes      Weight 2 2      Reps 10-15 10-15      Time 10 Minutes 10 Minutes        Recumbant Bike   Level 1 2      RPM 48 54      Watts 12 16      Minutes 15 15      METs 1.9 2.1        NuStep   Level 1 1      SPM 72 72      Minutes 15 15      METs 1.6 1.8               Exercise Comments:   Exercise  Comments     Row Name 12/15/23 1637 12/26/23 1637         Exercise Comments Pt first day in  the [facility] ICR program. Pt tolerated exercise well with an average MET level of 1.75. Pt is learning her THRR, RPE and ExRx. She's off to a good start Reviewed MET's and goals. Pt tolerated exercise well with an average MET level of 1.95. Pt is feeling good abuot her goals and feels an increase in strength and stamina. Her SOB is still a challenge, but she has an appt with Pulm to go over this. She has a friend group that meets at the [facility] at the same time as her exercise here, so she has a good routine for graduation. Will review more about home exercise following Pulm appt               Exercise Goals and Review:   Exercise Goals     Row Name 12/10/23 1413             Exercise Goals   Increase Physical Activity Yes       Intervention Provide advice, education, support and counseling about physical activity/exercise needs.;Develop an individualized exercise prescription for aerobic and resistive training based on initial evaluation findings, risk stratification, comorbidities and participant's personal goals.       Expected Outcomes Short Term: Attend rehab on a regular basis to increase amount of physical activity.;Long Term: Exercising regularly at least 3-5 days a week.;Long Term: Add in home exercise to make exercise part of routine and to increase amount of physical activity.       Increase Strength and Stamina Yes       Intervention Provide advice, education, support and counseling about physical activity/exercise needs.;Develop an individualized exercise prescription for aerobic and resistive training based on initial evaluation findings, risk stratification, comorbidities and participant's personal goals.       Expected Outcomes Short Term: Increase workloads from initial exercise prescription for resistance, speed, and METs.;Short Term: Perform resistance training exercises routinely  during rehab and add in resistance training at home;Long Term: Improve cardiorespiratory fitness, muscular endurance and strength as measured by increased METs and functional capacity ( )       Able to understand and use rate of perceived exertion (RPE) scale Yes       Intervention Provide education and explanation on how to use RPE scale       Expected Outcomes Short Term: Able to use RPE daily in rehab to express subjective intensity level;Long Term:  Able to use RPE to guide intensity level when exercising independently       Knowledge and understanding of Target Heart Rate Range (THRR) Yes       Intervention Provide education and explanation of THRR including how the numbers were predicted and where they are located for reference       Expected Outcomes Short Term: Able to state/look up THRR;Short Term: Able to use daily as guideline for intensity in rehab;Long Term: Able to use THRR to govern intensity when exercising independently       Understanding of Exercise Prescription Yes       Intervention Provide education, explanation, and written materials on patient's individual exercise prescription       Expected Outcomes Short Term: Able to explain program exercise prescription;Long Term: Able to explain home exercise prescription to exercise independently                Exercise Goals Re-Evaluation :  Exercise Goals Re-Evaluation     Row Name 12/15/23 1634 12/26/23 1634           Exercise Goal  Re-Evaluation   Exercise Goals Review Increase Physical Activity;Understanding of Exercise Prescription;Increase Strength and Stamina;Knowledge and understanding of Target Heart Rate Range (THRR);Able to understand and use rate of perceived exertion (RPE) scale Increase Physical Activity;Understanding of Exercise Prescription;Increase Strength and Stamina;Knowledge and understanding of Target Heart Rate Range (THRR);Able to understand and use rate of perceived exertion (RPE) scale       Comments Pt first day in the [facility] ICR program. Pt tolerated exercise well with an average MET level of 1.75. Pt is learning her THRR, RPE and ExRx. She's off to a good start Reviewed MET's and goals. Pt tolerated exercise well with an average MET level of 1.95. Pt is feeling good abuot her goals and feels an increase in strength and stamina. Her SOB is still a challenge, but she has an appt with Pulm to go over this. She has a friend group that meets at the [facility] at the same time as her exercise here, so she has a good routine for graduation. Will review more about home exercise following Pulm appt      Expected Outcomes Will continue to monitor pt and progress workloads as tolerated without sign or symptom Will continue to monitor pt and progress workloads as tolerated without sign or symptom               Discharge Exercise Prescription (Final Exercise Prescription Changes):  Exercise Prescription Changes - 12/26/23 1632       Response to Exercise   Blood Pressure (Admit) 144/84    Blood Pressure (Exercise) 160/70    Blood Pressure (Exit) 120/70    Heart Rate (Admit) 75 bpm    Heart Rate (Exercise) 109 bpm    Heart Rate (Exit) 80 bpm    Rating of Perceived Exertion (Exercise) 11    Perceived Dyspnea (Exercise) 0    Symptoms None    Comments Reviewed MET's and goals    Duration Progress to 30 minutes of  aerobic without signs/symptoms of physical distress    Intensity THRR unchanged      Progression   Progression Continue to progress workloads to maintain intensity without signs/symptoms of physical distress.    Average METs 1.95      Resistance Training   Training Prescription Yes    Weight 2    Reps 10-15    Time 10 Minutes      Recumbant Bike   Level 2    RPM 54    Watts 16    Minutes 15    METs 2.1      NuStep   Level 1    SPM 72    Minutes 15    METs 1.8             Nutrition:  Target Goals: Understanding of nutrition guidelines, daily intake of  sodium 1500mg , cholesterol 200mg , calories 30% from fat and 7% or less from saturated fats, daily to have 5 or more servings of fruits and vegetables.  Biometrics:  Pre Biometrics - 12/10/23 1410       Pre Biometrics   Waist Circumference 42.5 inches    Hip Circumference 40 inches    Waist to Hip Ratio 1.06 %    Triceps Skinfold 34 mm    % Body Fat 46.5 %    Grip Strength 20 kg    Flexibility 12.5 in    Single Leg Stand 3 seconds              Nutrition Therapy Plan  and Nutrition Goals:  Nutrition Therapy & Goals - 12/19/23 1528       Nutrition Therapy   Diet Heart Healthy Diet    Drug/Food Interactions Statins/Certain Fruits      Personal Nutrition Goals   Nutrition Goal Patient to identify strategies for reducing cardiovascular risk by attending the [facility] education and nutrition series weekly.    Personal Goal #2 Patient to improve diet quality by using the plate method as a guide for meal planning to include lean protein/plant protein, fruits, vegetables, whole grains, nonfat dairy as part of a well-balanced diet.    Comments Pessy has medical history of CABGx4, HTN, CAD, dyslipidemia. A1c is in a prediabetic range. Lipids are well controlled at goal. She reports motivation to lose weight; will continue to discuss strategies for weight loss including calorie density, the plate method, etc. Patient will benefit from participation in intensive cardiac rehab for nutrition, exercise, and lifestyle modification.      Intervention Plan   Intervention Prescribe, educate and counsel regarding individualized specific dietary modifications aiming towards targeted core components such as weight, hypertension, lipid management, diabetes, heart failure and other comorbidities.;Nutrition handout(s) given to patient.    Expected Outcomes Short Term Goal: Understand basic principles of dietary content, such as calories, fat, sodium, cholesterol and nutrients.;Long Term Goal: Adherence  to prescribed nutrition plan.             Nutrition Assessments:  MEDIFICTS Score Key: >=70 Need to make dietary changes  40-70 Heart Healthy Diet <= 40 Therapeutic Level Cholesterol Diet    Picture Your Plate Scores: <16 Unhealthy dietary pattern with much room for improvement. 41-50 Dietary pattern unlikely to meet recommendations for good health and room for improvement. 51-60 More healthful dietary pattern, with some room for improvement.  >60 Healthy dietary pattern, although there may be some specific behaviors that could be improved.    Nutrition Goals Re-Evaluation:  Nutrition Goals Re-Evaluation     Row Name 12/19/23 1528             Goals   Current Weight 164 lb 3.9 oz (74.5 kg)       Comment lipids WNL, LDL 57,  A1c 5.9       Expected Outcome Iysha has medical history of CABGx4, HTN, CAD, dyslipidemia. A1c is in a prediabetic range. Lipids are well controlled at goal. She reports motivation to lose weight; will continue to discuss strategies for weight loss including calorie density, the plate method, etc. Patient will benefit from participation in intensive cardiac rehab for nutrition, exercise, and lifestyle modification.                Nutrition Goals Re-Evaluation:  Nutrition Goals Re-Evaluation     Row Name 12/19/23 1528             Goals   Current Weight 164 lb 3.9 oz (74.5 kg)       Comment lipids WNL, LDL 57,  A1c 5.9       Expected Outcome Jovonda has medical history of CABGx4, HTN, CAD, dyslipidemia. A1c is in a prediabetic range. Lipids are well controlled at goal. She reports motivation to lose weight; will continue to discuss strategies for weight loss including calorie density, the plate method, etc. Patient will benefit from participation in intensive cardiac rehab for nutrition, exercise, and lifestyle modification.                Nutrition Goals Discharge (Final Nutrition Goals Re-Evaluation):  Nutrition Goals Re-Evaluation -  12/19/23 1528       Goals   Current Weight 164 lb 3.9 oz (74.5 kg)    Comment lipids WNL, LDL 57,  A1c 5.9    Expected Outcome Labella has medical history of CABGx4, HTN, CAD, dyslipidemia. A1c is in a prediabetic range. Lipids are well controlled at goal. She reports motivation to lose weight; will continue to discuss strategies for weight loss including calorie density, the plate method, etc. Patient will benefit from participation in intensive cardiac rehab for nutrition, exercise, and lifestyle modification.             Psychosocial: Target Goals: Acknowledge presence or absence of significant depression and/or stress, maximize coping skills, provide positive support system. Participant is able to verbalize types and ability to use techniques and skills needed for reducing stress and depression.  Initial Review & Psychosocial Screening:  Initial Psych Review & Screening - 12/10/23 1413       Initial Review   Current issues with None Identified      Family Dynamics   Good Support System? Yes   Baljit has her friends for support     Barriers   Psychosocial barriers to participate in program There are no identifiable barriers or psychosocial needs.      Screening Interventions   Interventions Encouraged to exercise;Provide feedback about the scores to participant    Expected Outcomes Short Term goal: Identification and review with participant of any Quality of Life or Depression concerns found by scoring the questionnaire.;Long Term goal: The participant improves quality of Life and PHQ9 Scores as seen by post scores and/or verbalization of changes             Quality of Life Scores:  Quality of Life - 12/10/23 1547       Quality of Life   Select Quality of Life      Quality of Life Scores   Health/Function Pre 20.57 %    Socioeconomic Pre 30 %    Psych/Spiritual Pre 29.14 %    Family Pre 22.5 %    GLOBAL Pre 24.4 %            Scores of 19 and below usually  indicate a poorer quality of life in these areas.  A difference of  2-3 points is a clinically meaningful difference.  A difference of 2-3 points in the total score of the Quality of Life Index has been associated with significant improvement in overall quality of life, self-image, physical symptoms, and general health in studies assessing change in quality of life.  PHQ-9: Review Flowsheet  More data exists      12/10/2023 06/11/2022 11/01/2021 10/19/2021 03/02/2018  Depression screen PHQ 2/9  Decreased Interest 0 0 0 0 0  Down, Depressed, Hopeless 0 0 0 0 0  PHQ - 2 Score 0 0 0 0 0  Altered sleeping 0 - - - -  Tired, decreased energy 1 - - - -  Change in appetite 1 - - - -  Feeling bad or failure about yourself  0 - - - -  Trouble concentrating 0 - - - -  Moving slowly or fidgety/restless 0 - - - -  Suicidal thoughts 0 - - - -  PHQ-9 Score 2 - - - -  Difficult doing work/chores Not difficult at all - - - -   Interpretation of Total Score  Total Score Depression Severity:  1-4 = Minimal depression, 5-9 = Mild depression, 10-14 = Moderate depression, 15-19 =  Moderately severe depression, 20-27 = Severe depression   Psychosocial Evaluation and Intervention:   Psychosocial Re-Evaluation:  Psychosocial Re-Evaluation     Row Name 12/16/23 1437 12/31/23 0855           Psychosocial Re-Evaluation   Current issues with None Identified None Identified      Comments -- Briaunna says that her energy level  and appetite is improving      Interventions Encouraged to attend Cardiac Rehabilitation for the exercise Encouraged to attend Cardiac Rehabilitation for the exercise      Continue Psychosocial Services  No Follow up required No Follow up required               Psychosocial Discharge (Final Psychosocial Re-Evaluation):  Psychosocial Re-Evaluation - 12/31/23 0855       Psychosocial Re-Evaluation   Current issues with None Identified    Comments Skylynn says that her energy level   and appetite is improving    Interventions Encouraged to attend Cardiac Rehabilitation for the exercise    Continue Psychosocial Services  No Follow up required             Vocational Rehabilitation: Provide vocational rehab assistance to qualifying candidates.   Vocational Rehab Evaluation & Intervention:  Vocational Rehab - 12/10/23 1414       Initial Vocational Rehab Evaluation & Intervention   Assessment shows need for Vocational Rehabilitation No   Taren is retired            Education: Education Goals: Education classes will be provided on a weekly basis, covering required topics. Participant will state understanding/return demonstration of topics presented.    Education     Row Name 12/15/23 1500     Education   Cardiac Education Topics [facility]   Glass blower/designer Nutrition   Nutrition Workshop Fueling a Forensic psychologist   Instruction Review Code 1- Teaching laboratory technician Start Time 1400   Class Stop Time 1440   Class Time Calculation (min) 40 min    Row Name 12/17/23 1500     Education   Cardiac Education Topics [facility]   Customer service manager   Weekly Topic International Cuisine- Spotlight on the [employer] Zones   Instruction Review Code 1- Verbalizes Understanding   Class Start Time 1400   Class Stop Time 1440   Class Time Calculation (min) 40 min    Row Name 12/19/23 1400     Education   Cardiac Education Topics [facility]   Psychologist, forensic Exercise Education   Exercise Education Improving Performance   Instruction Review Code 1- Verbalizes Understanding   Class Start Time 1400   Class Stop Time 1435   Class Time Calculation (min) 35 min    Row Name 12/22/23 1600     Education   Cardiac Education Topics [facility]   Glass blower/designer  Psychosocial   Psychosocial Workshop Healthy Sleep for a Healthy Heart   Instruction Review Code 1- Verbalizes Understanding   Class Start Time 1403   Class Stop Time 1457   Class Time Calculation (min) 54 min    Row Name 12/24/23 1400     Education   Cardiac Education Topics [facility]   [employer]  Midwife   Instruction Review Code 1- Verbalizes Understanding   Class Start Time 1355   Class Stop Time 1431   Class Time Calculation (min) 36 min    Row Name 12/26/23 1500     Education   Cardiac Education Topics [facility]   Select Core Videos     Core Videos   Educator Exercise Physiologist   Select Psychosocial   Psychosocial How Our Thoughts Can Heal Our Hearts   Instruction Review Code 1- Verbalizes Understanding   Class Start Time 1400   Class Stop Time 1440   Class Time Calculation (min) 40 min    Row Name 12/29/23 1500     Education   Cardiac Education Topics [facility]   Select Workshops     Workshops   Educator Exercise Physiologist   Select Exercise   Exercise Workshop Managing Heart Disease: Your Path to a Healthier Heart   Instruction Review Code 1- Verbalizes Understanding   Class Start Time 1400   Class Stop Time 1448   Class Time Calculation (min) 48 min            Core Videos: Exercise    Move It!  Clinical staff conducted group or individual video education with verbal and written material and guidebook.  Patient learns the recommended Pritikin exercise program. Exercise with the goal of living a long, healthy life. Some of the health benefits of exercise include controlled diabetes, healthier blood pressure levels, improved cholesterol levels, improved heart and lung capacity, improved sleep, and better body composition. Everyone should speak with their doctor before starting or changing an exercise routine.  Biomechanical Limitations Clinical staff conducted  group or individual video education with verbal and written material and guidebook.  Patient learns how biomechanical limitations can impact exercise and how we can mitigate and possibly overcome limitations to have an impactful and balanced exercise routine.  Body Composition Clinical staff conducted group or individual video education with verbal and written material and guidebook.  Patient learns that body composition (ratio of muscle mass to fat mass) is a key component to assessing overall fitness, rather than body weight alone. Increased fat mass, especially visceral belly fat, can put us  at increased risk for metabolic syndrome, type 2 diabetes, heart disease, and even death. It is recommended to combine diet and exercise (cardiovascular and resistance training) to improve your body composition. Seek guidance from your physician and exercise physiologist before implementing an exercise routine.  Exercise Action Plan Clinical staff conducted group or individual video education with verbal and written material and guidebook.  Patient learns the recommended strategies to achieve and enjoy long-term exercise adherence, including variety, self-motivation, self-efficacy, and positive decision making. Benefits of exercise include fitness, good health, weight management, more energy, better sleep, less stress, and overall well-being.  Medical   Heart Disease Risk Reduction Clinical staff conducted group or individual video education with verbal and written material and guidebook.  Patient learns our heart is our most vital organ as it circulates oxygen, nutrients, white blood cells, and hormones throughout the entire body, and carries waste away. Data supports a plant-based eating plan like the Pritikin Program for its effectiveness in slowing progression of and reversing heart disease. The video provides a number of recommendations to address heart disease.   Metabolic Syndrome and Belly Fat   Clinical staff conducted group or individual video education with verbal and written material and guidebook.  Patient learns  what metabolic syndrome is, how it leads to heart disease, and how one can reverse it and keep it from coming back. You have metabolic syndrome if you have 3 of the following 5 criteria: abdominal obesity, high blood pressure, high triglycerides, low HDL cholesterol, and high blood sugar.  Hypertension and Heart Disease Clinical staff conducted group or individual video education with verbal and written material and guidebook.  Patient learns that high blood pressure, or hypertension, is very common in the [place] . Hypertension is largely due to excessive salt intake, but other important risk factors include being overweight, physical inactivity, drinking too much alcohol, smoking, and not eating enough potassium from fruits and vegetables. High blood pressure is a leading risk factor for heart attack, stroke, congestive heart failure, dementia, kidney failure, and premature death. Long-term effects of excessive salt intake include stiffening of the arteries and thickening of heart muscle and organ damage. Recommendations include ways to reduce hypertension and the risk of heart disease.  Diseases of Our Time - Focusing on Diabetes Clinical staff conducted group or individual video education with verbal and written material and guidebook.  Patient learns why the best way to stop diseases of our time is prevention, through food and other lifestyle changes. Medicine (such as prescription pills and surgeries) is often only a Band-Aid on the problem, not a long-term solution. Most common diseases of our time include obesity, type 2 diabetes, hypertension, heart disease, and cancer. The Pritikin Program is recommended and has been proven to help reduce, reverse, and/or prevent the damaging effects of metabolic syndrome.  Nutrition   Overview of the Pritikin Eating Plan   Clinical staff conducted group or individual video education with verbal and written material and guidebook.  Patient learns about the Pritikin Eating Plan for disease risk reduction. The Pritikin Eating Plan emphasizes a wide variety of unrefined, minimally-processed carbohydrates, like fruits, vegetables, whole grains, and legumes. Go, Caution, and Stop food choices are explained. Plant-based and lean animal proteins are emphasized. Rationale provided for low sodium intake for blood pressure control, low added sugars for blood sugar stabilization, and low added fats and oils for coronary artery disease risk reduction and weight management.  Calorie Density  Clinical staff conducted group or individual video education with verbal and written material and guidebook.  Patient learns about calorie density and how it impacts the Pritikin Eating Plan. Knowing the characteristics of the food you choose will help you decide whether those foods will lead to weight gain or weight loss, and whether you want to consume more or less of them. Weight loss is usually a side effect of the Pritikin Eating Plan because of its focus on low calorie-dense foods.  Label Reading  Clinical staff conducted group or individual video education with verbal and written material and guidebook.  Patient learns about the Pritikin recommended label reading guidelines and corresponding recommendations regarding calorie density, added sugars, sodium content, and whole grains.  Dining Out - Part 1  Clinical staff conducted group or individual video education with verbal and written material and guidebook.  Patient learns that restaurant meals can be sabotaging because they can be so high in calories, fat, sodium, and/or sugar. Patient learns recommended strategies on how to positively address this and avoid unhealthy pitfalls.  Facts on Fats  Clinical staff conducted group or individual video education with verbal and written  material and guidebook.  Patient learns that lifestyle modifications can be just as effective, if not more so, as many medications  for lowering your risk of heart disease. A Pritikin lifestyle can help to reduce your risk of inflammation and atherosclerosis (cholesterol build-up, or plaque, in the artery walls). Lifestyle interventions such as dietary choices and physical activity address the cause of atherosclerosis. A review of the types of fats and their impact on blood cholesterol levels, along with dietary recommendations to reduce fat intake is also included.  Nutrition Action Plan  Clinical staff conducted group or individual video education with verbal and written material and guidebook.  Patient learns how to incorporate Pritikin recommendations into their lifestyle. Recommendations include planning and keeping personal health goals in mind as an important part of their success.  Healthy Mind-Set    Healthy Minds, Bodies, Hearts  Clinical staff conducted group or individual video education with verbal and written material and guidebook.  Patient learns how to identify when they are stressed. Video will discuss the impact of that stress, as well as the many benefits of stress management. Patient will also be introduced to stress management techniques. The way we think, act, and feel has an impact on our hearts.  How Our Thoughts Can Heal Our Hearts  Clinical staff conducted group or individual video education with verbal and written material and guidebook.  Patient learns that negative thoughts can cause depression and anxiety. This can result in negative lifestyle behavior and serious health problems. Cognitive behavioral therapy is an effective method to help control our thoughts in order to change and improve our emotional outlook.  Additional Videos:  Exercise    Improving Performance  Clinical staff conducted group or individual video education with verbal and written material and  guidebook.  Patient learns to use a non-linear approach by alternating intensity levels and lengths of time spent exercising to help burn more calories and lose more body fat. Cardiovascular exercise helps improve heart health, metabolism, hormonal balance, blood sugar control, and recovery from fatigue. Resistance training improves strength, endurance, balance, coordination, reaction time, metabolism, and muscle mass. Flexibility exercise improves circulation, posture, and balance. Seek guidance from your physician and exercise physiologist before implementing an exercise routine and learn your capabilities and proper form for all exercise.  Introduction to Yoga  Clinical staff conducted group or individual video education with verbal and written material and guidebook.  Patient learns about yoga, a discipline of the coming together of mind, breath, and body. The benefits of yoga include improved flexibility, improved range of motion, better posture and core strength, increased lung function, weight loss, and positive self-image. Yoga's heart health benefits include lowered blood pressure, healthier heart rate, decreased cholesterol and triglyceride levels, improved immune function, and reduced stress. Seek guidance from your physician and exercise physiologist before implementing an exercise routine and learn your capabilities and proper form for all exercise.  Medical   Aging: Enhancing Your Quality of Life  Clinical staff conducted group or individual video education with verbal and written material and guidebook.  Patient learns key strategies and recommendations to stay in good physical health and enhance quality of life, such as prevention strategies, having an advocate, securing a Health Care Proxy and Power of Attorney, and keeping a list of medications and system for tracking them. It also discusses how to avoid risk for bone loss.  Biology of Weight Control  Clinical staff conducted group or  individual video education with verbal and written material and guidebook.  Patient learns that weight gain occurs because we consume more calories than we burn (eating more, moving less). Even if  your body weight is normal, you may have higher ratios of fat compared to muscle mass. Too much body fat puts you at increased risk for cardiovascular disease, heart attack, stroke, type 2 diabetes, and obesity-related cancers. In addition to exercise, following the Pritikin Eating Plan can help reduce your risk.  Decoding Lab Results  Clinical staff conducted group or individual video education with verbal and written material and guidebook.  Patient learns that lab test reflects one measurement whose values change over time and are influenced by many factors, including medication, stress, sleep, exercise, food, hydration, pre-existing medical conditions, and more. It is recommended to use the knowledge from this video to become more involved with your lab results and evaluate your numbers to speak with your doctor.   Diseases of Our Time - Overview  Clinical staff conducted group or individual video education with verbal and written material and guidebook.  Patient learns that according to the CDC, 50% to 70% of chronic diseases (such as obesity, type 2 diabetes, elevated lipids, hypertension, and heart disease) are avoidable through lifestyle improvements including healthier food choices, listening to satiety cues, and increased physical activity.  Sleep Disorders Clinical staff conducted group or individual video education with verbal and written material and guidebook.  Patient learns how good quality and duration of sleep are important to overall health and well-being. Patient also learns about sleep disorders and how they impact health along with recommendations to address them, including discussing with a physician.  Nutrition  Dining Out - Part 2 Clinical staff conducted group or individual video  education with verbal and written material and guidebook.  Patient learns how to plan ahead and communicate in order to maximize their dining experience in a healthy and nutritious manner. Included are recommended food choices based on the type of restaurant the patient is visiting.   Fueling a Banker conducted group or individual video education with verbal and written material and guidebook.  There is a strong connection between our food choices and our health. Diseases like obesity and type 2 diabetes are very prevalent and are in large-part due to lifestyle choices. The Pritikin Eating Plan provides plenty of food and hunger-curbing satisfaction. It is easy to follow, affordable, and helps reduce health risks.  Menu Workshop  Clinical staff conducted group or individual video education with verbal and written material and guidebook.  Patient learns that restaurant meals can sabotage health goals because they are often packed with calories, fat, sodium, and sugar. Recommendations include strategies to plan ahead and to communicate with the manager, chef, or server to help order a healthier meal.  Planning Your Eating Strategy  Clinical staff conducted group or individual video education with verbal and written material and guidebook.  Patient learns about the Pritikin Eating Plan and its benefit of reducing the risk of disease. The Pritikin Eating Plan does not focus on calories. Instead, it emphasizes high-quality, nutrient-rich foods. By knowing the characteristics of the foods, we choose, we can determine their calorie density and make informed decisions.  Targeting Your Nutrition Priorities  Clinical staff conducted group or individual video education with verbal and written material and guidebook.  Patient learns that lifestyle habits have a tremendous impact on disease risk and progression. This video provides eating and physical activity recommendations based on your  personal health goals, such as reducing LDL cholesterol, losing weight, preventing or controlling type 2 diabetes, and reducing high blood pressure.  Vitamins and Minerals  Clinical staff conducted  group or individual video education with verbal and written material and guidebook.  Patient learns different ways to obtain key vitamins and minerals, including through a recommended healthy diet. It is important to discuss all supplements you take with your doctor.   Healthy Mind-Set    Smoking Cessation  Clinical staff conducted group or individual video education with verbal and written material and guidebook.  Patient learns that cigarette smoking and tobacco addiction pose a serious health risk which affects millions of people. Stopping smoking will significantly reduce the risk of heart disease, lung disease, and many forms of cancer. Recommended strategies for quitting are covered, including working with your doctor to develop a successful plan.  Culinary   Becoming a Set designer conducted group or individual video education with verbal and written material and guidebook.  Patient learns that cooking at home can be healthy, cost-effective, quick, and puts them in control. Keys to cooking healthy recipes will include looking at your recipe, assessing your equipment needs, planning ahead, making it simple, choosing cost-effective seasonal ingredients, and limiting the use of added fats, salts, and sugars.  Cooking - Breakfast and Snacks  Clinical staff conducted group or individual video education with verbal and written material and guidebook.  Patient learns how important breakfast is to satiety and nutrition through the entire day. Recommendations include key foods to eat during breakfast to help stabilize blood sugar levels and to prevent overeating at meals later in the day. Planning ahead is also a key component.  Cooking - Educational psychologist conducted  group or individual video education with verbal and written material and guidebook.  Patient learns eating strategies to improve overall health, including an approach to cook more at home. Recommendations include thinking of animal protein as a side on your plate rather than center stage and focusing instead on lower calorie dense options like vegetables, fruits, whole grains, and plant-based proteins, such as beans. Making sauces in large quantities to freeze for later and leaving the skin on your vegetables are also recommended to maximize your experience.  Cooking - Healthy Salads and Dressing Clinical staff conducted group or individual video education with verbal and written material and guidebook.  Patient learns that vegetables, fruits, whole grains, and legumes are the foundations of the Pritikin Eating Plan. Recommendations include how to incorporate each of these in flavorful and healthy salads, and how to create homemade salad dressings. Proper handling of ingredients is also covered. Cooking - Soups and [employer] - Soups and Desserts Clinical staff conducted group or individual video education with verbal and written material and guidebook.  Patient learns that Pritikin soups and desserts make for easy, nutritious, and delicious snacks and meal components that are low in sodium, fat, sugar, and calorie density, while high in vitamins, minerals, and filling fiber. Recommendations include simple and healthy ideas for soups and desserts.   Overview     The Pritikin Solution Program Overview Clinical staff conducted group or individual video education with verbal and written material and guidebook.  Patient learns that the results of the Pritikin Program have been documented in more than 100 articles published in peer-reviewed journals, and the benefits include reducing risk factors for (and, in some cases, even reversing) high cholesterol, high blood pressure, type 2 diabetes, obesity,  and more! An overview of the three key pillars of the Pritikin Program will be covered: eating well, doing regular exercise, and having a healthy mind-set.  WORKSHOPS  Exercise: Exercise Basics: Building Your Action Plan Clinical staff led group instruction and group discussion with PowerPoint presentation and patient guidebook. To enhance the learning environment the use of posters, models and videos may be added. At the conclusion of this workshop, patients will comprehend the difference between physical activity and exercise, as well as the benefits of incorporating both, into their routine. Patients will understand the FITT (Frequency, Intensity, Time, and Type) principle and how to use it to build an exercise action plan. In addition, safety concerns and other considerations for exercise and cardiac rehab will be addressed by the presenter. The purpose of this lesson is to promote a comprehensive and effective weekly exercise routine in order to improve patients' overall level of fitness.   Managing Heart Disease: Your Path to a Healthier Heart Clinical staff led group instruction and group discussion with PowerPoint presentation and patient guidebook. To enhance the learning environment the use of posters, models and videos may be added.At the conclusion of this workshop, patients will understand the anatomy and physiology of the heart. Additionally, they will understand how Pritikin's three pillars impact the risk factors, the progression, and the management of heart disease.  The purpose of this lesson is to provide a high-level overview of the heart, heart disease, and how the Pritikin lifestyle positively impacts risk factors.  Exercise Biomechanics Clinical staff led group instruction and group discussion with PowerPoint presentation and patient guidebook. To enhance the learning environment the use of posters, models and videos may be added. Patients will learn how the structural  parts of their bodies function and how these functions impact their daily activities, movement, and exercise. Patients will learn how to promote a neutral spine, learn how to manage pain, and identify ways to improve their physical movement in order to promote healthy living. The purpose of this lesson is to expose patients to common physical limitations that impact physical activity. Participants will learn practical ways to adapt and manage aches and pains, and to minimize their effect on regular exercise. Patients will learn how to maintain good posture while sitting, walking, and lifting.  Balance Training and Fall Prevention  Clinical staff led group instruction and group discussion with PowerPoint presentation and patient guidebook. To enhance the learning environment the use of posters, models and videos may be added. At the conclusion of this workshop, patients will understand the importance of their sensorimotor skills (vision, proprioception, and the vestibular system) in maintaining their ability to balance as they age. Patients will apply a variety of balancing exercises that are appropriate for their current level of function. Patients will understand the common causes for poor balance, possible solutions to these problems, and ways to modify their physical environment in order to minimize their fall risk. The purpose of this lesson is to teach patients about the importance of maintaining balance as they age and ways to minimize their risk of falling.  WORKSHOPS   Nutrition:  Fueling a Ship broker led group instruction and group discussion with PowerPoint presentation and patient guidebook. To enhance the learning environment the use of posters, models and videos may be added. Patients will review the foundational principles of the Pritikin Eating Plan and understand what constitutes a serving size in each of the food groups. Patients will also learn Pritikin-friendly  foods that are better choices when away from home and review make-ahead meal and snack options. Calorie density will be reviewed and applied to three nutrition priorities: weight maintenance, weight loss, and weight  gain. The purpose of this lesson is to reinforce (in a group setting) the key concepts around what patients are recommended to eat and how to apply these guidelines when away from home by planning and selecting Pritikin-friendly options. Patients will understand how calorie density may be adjusted for different weight management goals.  Mindful Eating  Clinical staff led group instruction and group discussion with PowerPoint presentation and patient guidebook. To enhance the learning environment the use of posters, models and videos may be added. Patients will briefly review the concepts of the Pritikin Eating Plan and the importance of low-calorie dense foods. The concept of mindful eating will be introduced as well as the importance of paying attention to internal hunger signals. Triggers for non-hunger eating and techniques for dealing with triggers will be explored. The purpose of this lesson is to provide patients with the opportunity to review the basic principles of the Pritikin Eating Plan, discuss the value of eating mindfully and how to measure internal cues of hunger and fullness using the Hunger Scale. Patients will also discuss reasons for non-hunger eating and learn strategies to use for controlling emotional eating.  Targeting Your Nutrition Priorities Clinical staff led group instruction and group discussion with PowerPoint presentation and patient guidebook. To enhance the learning environment the use of posters, models and videos may be added. Patients will learn how to determine their genetic susceptibility to disease by reviewing their family history. Patients will gain insight into the importance of diet as part of an overall healthy lifestyle in mitigating the impact of  genetics and other environmental insults. The purpose of this lesson is to provide patients with the opportunity to assess their personal nutrition priorities by looking at their family history, their own health history and current risk factors. Patients will also be able to discuss ways of prioritizing and modifying the Pritikin Eating Plan for their highest risk areas  Menu  Clinical staff led group instruction and group discussion with PowerPoint presentation and patient guidebook. To enhance the learning environment the use of posters, models and videos may be added. Using menus brought in from [employer], or printed from [employer], patients will apply the Pritikin dining out guidelines that were presented in the [employer] video. Patients will also be able to practice these guidelines in a variety of provided scenarios. The purpose of this lesson is to provide patients with the opportunity to practice hands-on learning of the Pritikin Dining Out guidelines with actual menus and practice scenarios.  Label Reading Clinical staff led group instruction and group discussion with PowerPoint presentation and patient guidebook. To enhance the learning environment the use of posters, models and videos may be added. Patients will review and discuss the Pritikin label reading guidelines presented in [employer]'s Label Reading Educational series video. Using fool labels brought in from local grocery stores and markets, patients will apply the label reading guidelines and determine if the packaged food meet the Pritikin guidelines. The purpose of this lesson is to provide patients with the opportunity to review, discuss, and practice hands-on learning of the Pritikin Label Reading guidelines with actual packaged food labels. Cooking School  Pritikin's [employer] are designed to teach patients ways to prepare quick, simple, and affordable recipes at home. The importance of  nutrition's role in chronic disease risk reduction is reflected in its emphasis in the overall Pritikin program. By learning how to prepare essential core Pritikin Eating Plan recipes, patients will increase control over what they  eat; be able to customize the flavor of foods without the use of added salt, sugar, or fat; and improve the quality of the food they consume. By learning a set of core recipes which are easily assembled, quickly prepared, and affordable, patients are more likely to prepare more healthy foods at home. These workshops focus on convenient breakfasts, simple entres, side dishes, and desserts which can be prepared with minimal effort and are consistent with nutrition recommendations for cardiovascular risk reduction. Cooking [employer] are taught by a Armed forces logistics/support/administrative officer (RD) who has been trained by the [employer]. The chef or RD has a clear understanding of the importance of minimizing - if not completely eliminating - added fat, sugar, and sodium in recipes. Throughout the series of Cooking School Workshop sessions, patients will learn about healthy ingredients and efficient methods of cooking to build confidence in their capability to prepare    Cooking School weekly topics:  Adding Flavor- Sodium-Free  Fast and Healthy Breakfasts  Powerhouse Plant-Based Proteins  Satisfying Salads and Dressings  Simple Sides and Sauces  International Cuisine-Spotlight on the [employer] Zones  Delicious Desserts  Savory Soups  [employer] - Meals in a Astronomer Appetizers and Snacks  Comforting Weekend Breakfasts  One-Pot Wonders   Fast Evening Meals  Landscape architect Your Pritikin Plate  WORKSHOPS   Healthy Mindset (Psychosocial):  Focused Goals, Sustainable Changes Clinical staff led group instruction and group discussion with PowerPoint presentation and patient guidebook. To enhance the learning environment the use of posters,  models and videos may be added. Patients will be able to apply effective goal setting strategies to establish at least one personal goal, and then take consistent, meaningful action toward that goal. They will learn to identify common barriers to achieving personal goals and develop strategies to overcome them. Patients will also gain an understanding of how our mind-set can impact our ability to achieve goals and the importance of cultivating a positive and growth-oriented mind-set. The purpose of this lesson is to provide patients with a deeper understanding of how to set and achieve personal goals, as well as the tools and strategies needed to overcome common obstacles which may arise along the way.  From Head to Heart: The Power of a Healthy Outlook  Clinical staff led group instruction and group discussion with PowerPoint presentation and patient guidebook. To enhance the learning environment the use of posters, models and videos may be added. Patients will be able to recognize and describe the impact of emotions and mood on physical health. They will discover the importance of self-care and explore self-care practices which may work for them. Patients will also learn how to utilize the 4 C's to cultivate a healthier outlook and better manage stress and challenges. The purpose of this lesson is to demonstrate to patients how a healthy outlook is an essential part of maintaining good health, especially as they continue their cardiac rehab journey.  Healthy Sleep for a Healthy Heart Clinical staff led group instruction and group discussion with PowerPoint presentation and patient guidebook. To enhance the learning environment the use of posters, models and videos may be added. At the conclusion of this workshop, patients will be able to demonstrate knowledge of the importance of sleep to overall health, well-being, and quality of life. They will understand the symptoms of, and treatments for, common sleep  disorders. Patients will also be able to identify daytime and nighttime behaviors which impact sleep, and they  will be able to apply these tools to help manage sleep-related challenges. The purpose of this lesson is to provide patients with a general overview of sleep and outline the importance of quality sleep. Patients will learn about a few of the most common sleep disorders. Patients will also be introduced to the concept of "sleep hygiene," and discover ways to self-manage certain sleeping problems through simple daily behavior changes. Finally, the workshop will motivate patients by clarifying the links between quality sleep and their goals of heart-healthy living.   Recognizing and Reducing Stress Clinical staff led group instruction and group discussion with PowerPoint presentation and patient guidebook. To enhance the learning environment the use of posters, models and videos may be added. At the conclusion of this workshop, patients will be able to understand the types of stress reactions, differentiate between acute and chronic stress, and recognize the impact that chronic stress has on their health. They will also be able to apply different coping mechanisms, such as reframing negative self-talk. Patients will have the opportunity to practice a variety of stress management techniques, such as deep abdominal breathing, progressive muscle relaxation, and/or guided imagery.  The purpose of this lesson is to educate patients on the role of stress in their lives and to provide healthy techniques for coping with it.  Learning Barriers/Preferences:  Learning Barriers/Preferences - 12/10/23 1414       Learning Barriers/Preferences   Learning Barriers Hearing;Sight   reading glasses and hearing aids   Learning Preferences Audio;Computer/Internet;Group Instruction;Individual Instruction;Pictoral;Skilled Demonstration;Verbal Instruction;Video;Written Material             Education  Topics:  Knowledge Questionnaire Score:  Knowledge Questionnaire Score - 12/10/23 1419       Knowledge Questionnaire Score   Pre Score 24/24             Core Components/Risk Factors/Patient Goals at Admission:  Personal Goals and Risk Factors at Admission - 12/10/23 1414       Core Components/Risk Factors/Patient Goals on Admission    Weight Management Yes;Obesity;Weight Loss    Intervention Weight Management: Develop a combined nutrition and exercise program designed to reach desired caloric intake, while maintaining appropriate intake of nutrient and fiber, sodium and fats, and appropriate energy expenditure required for the weight goal.;Weight Management: Provide education and appropriate resources to help participant work on and attain dietary goals.;Weight Management/Obesity: Establish reasonable short term and long term weight goals.;Obesity: Provide education and appropriate resources to help participant work on and attain dietary goals.    Expected Outcomes Short Term: Continue to assess and modify interventions until short term weight is achieved;Long Term: Adherence to nutrition and physical activity/exercise program aimed toward attainment of established weight goal;Weight Loss: Understanding of general recommendations for a balanced deficit meal plan, which promotes 1-2 lb weight loss per week and includes a negative energy balance of [phone] kcal/d;Understanding recommendations for meals to include 15-35% energy as protein, 25-35% energy from fat, 35-60% energy from carbohydrates, less than 200mg  of dietary cholesterol, 20-35 gm of total fiber daily;Understanding of distribution of calorie intake throughout the day with the consumption of 4-5 meals/snacks    Hypertension Yes    Intervention Provide education on lifestyle modifcations including regular physical activity/exercise, weight management, moderate sodium restriction and increased consumption of fresh fruit, vegetables,  and low fat dairy, alcohol moderation, and smoking cessation.;Monitor prescription use compliance.    Expected Outcomes Short Term: Continued assessment and intervention until BP is < 140/52mm HG in hypertensive participants. < 130/3mm HG  in hypertensive participants with diabetes, heart failure or chronic kidney disease.;Long Term: Maintenance of blood pressure at goal levels.    Lipids Yes    Intervention Provide education and support for participant on nutrition & aerobic/resistive exercise along with prescribed medications to achieve LDL 70mg , HDL >40mg .    Expected Outcomes Short Term: Participant states understanding of desired cholesterol values and is compliant with medications prescribed. Participant is following exercise prescription and nutrition guidelines.;Long Term: Cholesterol controlled with medications as prescribed, with individualized exercise RX and with personalized nutrition plan. Value goals: LDL < 70mg , HDL > 40 mg.             Core Components/Risk Factors/Patient Goals Review:   Goals and Risk Factor Review     Row Name 12/16/23 1438 12/31/23 0857           Core Components/Risk Factors/Patient Goals Review   Personal Goals Review Weight Management/Obesity;Hypertension;Lipids Weight Management/Obesity;Hypertension;Lipids      Review Sharmon did well with exercise for her fitness level. Vital signs were stable. Some moderate exertional BP elevations were noted. Will continue to montior BP Debria Fang did well with exercise for her fitness level. Vital signs have been  stable      Expected Outcomes Geeta will continue to participate in cardiac rehab for exercise, nutrition and lifestyle modifications Nishka will continue to participate in cardiac rehab for exercise, nutrition and lifestyle modifications               Core Components/Risk Factors/Patient Goals at Discharge (Final Review):   Goals and Risk Factor Review - 12/31/23 0857       Core Components/Risk  Factors/Patient Goals Review   Personal Goals Review Weight Management/Obesity;Hypertension;Lipids    Review Thamara did well with exercise for her fitness level. Vital signs have been  stable    Expected Outcomes Phaedra will continue to participate in cardiac rehab for exercise, nutrition and lifestyle modifications             ITP Comments:  ITP Comments     Row Name 12/10/23 1410 12/16/23 1435 12/31/23 0854       ITP Comments Dr. Gaylyn Keas medical director. Introduction to pritikin education/intensive cardiac rehab. Initial orientation packet reviewed with patient. 30 Day ITP Review. Cythnia started cardiac rehab on 12/15/23. Anayely did well with exercise. 30 Day ITP Review. Kamiya started cardiac rehab on 12/15/23. Yuki is off to a good start with exercise.              Comments: See ITP comments.Monte Antonio RN BSN

## 2024-01-02 ENCOUNTER — Encounter (HOSPITAL_COMMUNITY)
Admission: RE | Admit: 2024-01-02 | Discharge: 2024-01-02 | Disposition: A | Discharge status: Home / Self Care | Source: Ambulatory Visit | Attending: Cardiology | Admitting: Cardiology

## 2024-01-02 DIAGNOSIS — Z951 Presence of aortocoronary bypass graft: Secondary | ICD-10-CM | POA: Diagnosis not present

## 2024-01-05 ENCOUNTER — Encounter (HOSPITAL_COMMUNITY)
Admission: RE | Admit: 2024-01-05 | Discharge: 2024-01-05 | Disposition: A | Discharge status: Home / Self Care | Source: Ambulatory Visit | Attending: Cardiology

## 2024-01-05 DIAGNOSIS — Z951 Presence of aortocoronary bypass graft: Secondary | ICD-10-CM | POA: Diagnosis not present

## 2024-01-07 ENCOUNTER — Encounter (HOSPITAL_COMMUNITY)
Admission: RE | Admit: 2024-01-07 | Discharge: 2024-01-07 | Disposition: A | Discharge status: Home / Self Care | Source: Ambulatory Visit | Attending: Cardiology | Admitting: Cardiology

## 2024-01-07 DIAGNOSIS — Z951 Presence of aortocoronary bypass graft: Secondary | ICD-10-CM

## 2024-01-09 ENCOUNTER — Encounter (HOSPITAL_COMMUNITY)
Admission: RE | Admit: 2024-01-09 | Discharge: 2024-01-09 | Disposition: A | Discharge status: Home / Self Care | Source: Ambulatory Visit | Attending: Cardiology | Admitting: Cardiology

## 2024-01-09 DIAGNOSIS — Z951 Presence of aortocoronary bypass graft: Secondary | ICD-10-CM | POA: Insufficient documentation

## 2024-01-12 ENCOUNTER — Encounter (HOSPITAL_COMMUNITY)
Admission: RE | Admit: 2024-01-12 | Discharge: 2024-01-12 | Disposition: A | Discharge status: Home / Self Care | Source: Ambulatory Visit | Attending: Cardiology

## 2024-01-12 DIAGNOSIS — Z951 Presence of aortocoronary bypass graft: Secondary | ICD-10-CM

## 2024-01-14 ENCOUNTER — Encounter (HOSPITAL_COMMUNITY)
Admission: RE | Admit: 2024-01-14 | Discharge: 2024-01-14 | Disposition: A | Discharge status: Home / Self Care | Source: Ambulatory Visit | Attending: Cardiology | Admitting: Cardiology

## 2024-01-14 DIAGNOSIS — Z951 Presence of aortocoronary bypass graft: Secondary | ICD-10-CM

## 2024-01-15 ENCOUNTER — Encounter: Payer: Self-pay | Admitting: Cardiology

## 2024-01-16 ENCOUNTER — Encounter (HOSPITAL_COMMUNITY)
Admission: RE | Admit: 2024-01-16 | Discharge: 2024-01-16 | Disposition: A | Discharge status: Home / Self Care | Source: Ambulatory Visit | Attending: Cardiology | Admitting: Cardiology

## 2024-01-16 DIAGNOSIS — Z951 Presence of aortocoronary bypass graft: Secondary | ICD-10-CM

## 2024-01-16 NOTE — Telephone Encounter (Signed)
 Yes, please get an "okay" from CT service. I assume they will clear her.   Megan Capetillo [place], DO, [place]

## 2024-01-19 ENCOUNTER — Telehealth: Payer: Self-pay | Admitting: Cardiology

## 2024-01-19 ENCOUNTER — Encounter (HOSPITAL_COMMUNITY): Admission: RE | Admit: 2024-01-19 | Source: Ambulatory Visit

## 2024-01-19 ENCOUNTER — Telehealth (HOSPITAL_COMMUNITY): Payer: Self-pay

## 2024-01-19 NOTE — Telephone Encounter (Signed)
 Pt c/o medication issue:  1. Name of Medication:   amiodarone  (PACERONE ) 100 MG tablet (Expired)  metoprolol  succinate (TOPROL -XL) 25 MG 24 hr tablet   2. How are you currently taking this medication (dosage and times per day)? As written  3. Are you having a reaction (difficulty breathing--STAT)?No  4. What is your medication issue? Patient's friend and patient is calling because the pharmacy told her the medications do not react well with each other. Patient would like to know which medications she should be taking. Patient has been in cardiac rehab and was wanting to start swimming and using the rowing machine. Patient was informed she would need to speak with the cardiologist to see what is recommended. Please advise.

## 2024-01-19 NOTE — Telephone Encounter (Signed)
 Spoke with pt regarding her questions. Will forward to PharmD to review the medication interaction. Pt was wondering if she is ok to start swimming and using the rowing machine. She had been in cardiac rehab but wants to do more activity and was told to check with her cardiologist. Pt was told her question would be forwarded to Dr. Albert Huff and his nurse for suggestions. Pt verbalized understanding. All questions if any were answered.

## 2024-01-19 NOTE — Telephone Encounter (Signed)
 Patient called requesting to cancel all future cardiac rehab appts. States she is not happy with how she has been treated. Says that she has been told she can't do things due to "her age" and her medications, and that she is a "fall hazard", which she does not agree with. Patient also does not like the Pritikin diet that has been recommended to her.  Patient refused to be transferred to any admin staff or speak to anyone else. Patient did not want to speak more about it or give more details, stated she just wanted all her classes cancelled and will attend the [facility] gym for her exercise.

## 2024-01-19 NOTE — Telephone Encounter (Signed)
 She had bypass surgery in Jan 2025 and the postoperative course was a bit prolonged than anticipated.   The reason I had referred her call to the CT surgery office was to make sure that her sternotomy site was well-healed and that it would be safe to start rowing and swimming as these activities does effect the sternotomy site and overall healing.    In addition, last office note mentioned "lifting restrictions."  Was unclear if they are removing all the restrictions or putting weight limitations.   Lonni Robert can you call CT sx office and clarify for patient safety.   Megan Pham [place], DO, [facility]

## 2024-01-19 NOTE — Telephone Encounter (Signed)
 Megan Pham,   Can you call the pharmacy and find out what they are referring to in regards to Toprol  XL and Amiodarone ? Its commonly used together. Not sure what the concern is.   Refer to the other thread regarding swimming and rowing machine to decrease confusion.   [name], DO, [facility]

## 2024-01-21 ENCOUNTER — Encounter (HOSPITAL_COMMUNITY)

## 2024-01-21 NOTE — Telephone Encounter (Signed)
 Spoke with pt and explained that our office was waiting for recommendations from cardiac rehab and PharmD regarding her questions. Explained to pt that because she did not finish cardiac rehab and the days she was in cardiac rehab, they stated she was not at the level to be cleared for the rowing machine, our office could not give clearance. She kept stating she did not want to go by cardiac rehab recommendations because she felt they didn't do anything for her Explained that we were sorry she had that experience but because our office was not directly involved with her tx for this aspect of care, we can not clear her for these movements and she will need to contact Dr. Raina Bunting office. She stated that is all she needed to know and was okay with doing that. Also explained to her that the amio and metoprolol  were okay to take together per PharmD. Pt denied any further questions at this time.

## 2024-01-21 NOTE — Telephone Encounter (Signed)
 Pt would like a c/b in regards to previous phone note. Please advise

## 2024-01-22 ENCOUNTER — Encounter: Payer: Self-pay | Admitting: Cardiology

## 2024-01-22 NOTE — Telephone Encounter (Signed)
 Lonni Robert,  Not sure if they was a old message as we have addressed this already.   ST

## 2024-01-22 NOTE — Telephone Encounter (Signed)
 Thanks for the follow up .   ST

## 2024-01-23 ENCOUNTER — Encounter (HOSPITAL_COMMUNITY)

## 2024-01-26 ENCOUNTER — Encounter (HOSPITAL_COMMUNITY)

## 2024-01-28 ENCOUNTER — Encounter (HOSPITAL_COMMUNITY)

## 2024-01-29 NOTE — Progress Notes (Signed)
 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/23 CABG x4  ADL [facility]SD:   Initial Exercise Prescription:  Initial Exercise Prescription - 12/10/23 1500       Date of Initial Exercise RX and Referring Provider   Date 12/10/23    Referring Provider Olinda Bertrand, DO    Expected Discharge Date 03/03/24      Recumbant Bike   Level 1    RPM 50    Watts 20    Minutes 15    METs 2      NuStep   Level 1    SPM 70    Minutes 15    METs 2      Track   Laps 15    Minutes 15    METs 2      Prescription Details   Frequency (times per week) 3    Duration Progress to 30 minutes of continuous aerobic without signs/symptoms of physical distress      Intensity   THRR 40-80% of Max Heartrate 56-113    Ratings of Perceived Exertion 11-13    Perceived Dyspnea 0-4      Progression   Progression Continue progressive overload as per policy without signs/symptoms or physical distress.      Resistance Training   Training Prescription Yes    Weight 2    Reps 10-15             Discharge Exercise Prescription (Final Exercise Prescription Changes):  Exercise Prescription Changes - 01/09/24 1643       Response to Exercise   Blood Pressure (Admit) 130/64    Blood Pressure (Exit) 122/78    Heart Rate (Admit) 71 bpm    Heart Rate (Exercise) 87 bpm    Heart Rate (Exit) 71 bpm    Rating of Perceived Exertion (Exercise) 9    Perceived  Dyspnea (Exercise) 0    Symptoms None    Comments Reviewed MET's    Duration Progress to 30 minutes of  aerobic without signs/symptoms of physical distress    Intensity THRR unchanged      Progression   Progression Continue to progress workloads to maintain intensity without signs/symptoms of physical distress.    Average METs 1.95      Resistance Training   Training Prescription Yes    Weight 2    Reps 10-15    Time 10 Minutes      Recumbant Bike   Level 2    RPM 58    Watts 15    Minutes 15    METs 2.3      NuStep   Level 1    SPM 72    Minutes 15    METs 1.8             Functional Capacity:  6 Minute Walk     Row Name 12/10/23 1544  6 Minute Walk   Phase Initial     Distance 1004 feet     Walk Time 6 minutes     # of Rest Breaks 0     MPH 1.9     METS 1.96     RPE 13     Perceived Dyspnea  1     VO2 Peak 6.88     Symptoms Yes (comment)     Comments SOB, resolved with rest. no pain     Resting HR 68 bpm     Resting BP 148/66     Resting Oxygen Saturation  97 %     Exercise Oxygen Saturation  during 6 min walk 96 %     Max Ex. HR 107 bpm     Max Ex. BP 168/60     2 Minute Post BP 146/64              Psychological, QOL, Others - Outcomes: PHQ 2/9:    12/10/2023    2:12 PM 06/11/2022   10:55 AM 11/01/2021    9:11 AM 10/19/2021    9:35 AM 03/02/2018    1:42 PM  Depression screen PHQ 2/9  Decreased Interest 0 0 0 0 0  Down, Depressed, Hopeless 0 0 0 0 0  PHQ - 2 Score 0 0 0 0 0  Altered sleeping 0      Tired, decreased energy 1      Change in appetite 1      Feeling bad or failure about yourself  0      Trouble concentrating 0      Moving slowly or fidgety/restless 0      Suicidal thoughts 0      PHQ-9 Score 2      Difficult doing work/chores Not difficult at all        Quality of Life:  Quality of Life - 12/10/23 1547       Quality of Life   Select Quality of Life      Quality of Life Scores   Health/Function Pre 20.57  %    Socioeconomic Pre 30 %    Psych/Spiritual Pre 29.14 %    Family Pre 22.5 %    GLOBAL Pre 24.4 %             Personal Goals: Goals established at orientation with interventions provided to work toward goal.  Personal Goals and Risk Factors at Admission - 12/10/23 1414       Core Components/Risk Factors/Patient Goals on Admission    Weight Management Yes;Obesity;Weight Loss    Intervention Weight Management: Develop a combined nutrition and exercise program designed to reach desired caloric intake, while maintaining appropriate intake of nutrient and fiber, sodium and fats, and appropriate energy expenditure required for the weight goal.;Weight Management: Provide education and appropriate resources to help participant work on and attain dietary goals.;Weight Management/Obesity: Establish reasonable short term and long term weight goals.;Obesity: Provide education and appropriate resources to help participant work on and attain dietary goals.    Expected Outcomes Short Term: Continue to assess and modify interventions until short term weight is achieved;Long Term: Adherence to nutrition and physical activity/exercise program aimed toward attainment of established weight goal;Weight Loss: Understanding of general recommendations for a balanced deficit meal plan, which promotes 1-2 lb weight loss per week and includes a negative energy balance of [id number] kcal/d;Understanding recommendations for meals to include 15-35% energy as protein, 25-35% energy from fat, 35-60% energy from carbohydrates,  less than 200mg  of dietary cholesterol, 20-35 gm of total fiber daily;Understanding of distribution of calorie intake throughout the day with the consumption of 4-5 meals/snacks    Hypertension Yes    Intervention Provide education on lifestyle modifcations including regular physical activity/exercise, weight management, moderate sodium restriction and increased consumption of fresh fruit, vegetables,  and low fat dairy, alcohol moderation, and smoking cessation.;Monitor prescription use compliance.    Expected Outcomes Short Term: Continued assessment and intervention until BP is < 140/65mm HG in hypertensive participants. < 130/68mm HG in hypertensive participants with diabetes, heart failure or chronic kidney disease.;Long Term: Maintenance of blood pressure at goal levels.    Lipids Yes    Intervention Provide education and support for participant on nutrition & aerobic/resistive exercise along with prescribed medications to achieve LDL 70mg , HDL >40mg .    Expected Outcomes Short Term: Participant states understanding of desired cholesterol values and is compliant with medications prescribed. Participant is following exercise prescription and nutrition guidelines.;Long Term: Cholesterol controlled with medications as prescribed, with individualized exercise RX and with personalized nutrition plan. Value goals: LDL < 70mg , HDL > 40 mg.              Personal Goals Discharge:  Goals and Risk Factor Review     Row Name 12/16/23 1438 12/31/23 0857 01/29/24 1516         Core Components/Risk Factors/Patient Goals Review   Personal Goals Review Weight Management/Obesity;Hypertension;Lipids Weight Management/Obesity;Hypertension;Lipids Weight Management/Obesity;Hypertension;Lipids     Review Megan Pham did well with exercise for her fitness level. Vital signs were stable. Some moderate exertional BP elevations were noted. Will continue to montior BP Megan Pham did well with exercise for her fitness level. Vital signs have been  stable Megan Pham did well with exercise for her fitness level. Vital were stable. Megan Pham decided to continue exercise on her own and the [facility] and stopped participation in the program     Expected Outcomes Megan Pham will continue to participate in cardiac rehab for exercise, nutrition and lifestyle modifications Megan Pham will continue to participate in cardiac rehab for exercise, nutrition  and lifestyle modifications Megan Pham will continue to exercise, follow  nutrition and lifestyle modifications on her own              Exercise Goals and Review:  Exercise Goals     Row Name 12/10/23 1413             Exercise Goals   Increase Physical Activity Yes       Intervention Provide advice, education, support and counseling about physical activity/exercise needs.;Develop an individualized exercise prescription for aerobic and resistive training based on initial evaluation findings, risk stratification, comorbidities and participant's personal goals.       Expected Outcomes Short Term: Attend rehab on a regular basis to increase amount of physical activity.;Long Term: Exercising regularly at least 3-5 days a week.;Long Term: Add in home exercise to make exercise part of routine and to increase amount of physical activity.       Increase Strength and Stamina Yes       Intervention Provide advice, education, support and counseling about physical activity/exercise needs.;Develop an individualized exercise prescription for aerobic and resistive training based on initial evaluation findings, risk stratification, comorbidities and participant's personal goals.       Expected Outcomes Short Term: Increase workloads from initial exercise prescription for resistance, speed, and METs.;Short Term: Perform resistance training exercises routinely during rehab and add in resistance training at home;Long Term: Improve cardiorespiratory fitness, muscular  endurance and strength as measured by increased METs and functional capacity ( )       Able to understand and use rate of perceived exertion (RPE) scale Yes       Intervention Provide education and explanation on how to use RPE scale       Expected Outcomes Short Term: Able to use RPE daily in rehab to express subjective intensity level;Long Term:  Able to use RPE to guide intensity level when exercising independently       Knowledge and  understanding of Target Heart Rate Range (THRR) Yes       Intervention Provide education and explanation of THRR including how the numbers were predicted and where they are located for reference       Expected Outcomes Short Term: Able to state/look up THRR;Short Term: Able to use daily as guideline for intensity in rehab;Long Term: Able to use THRR to govern intensity when exercising independently       Understanding of Exercise Prescription Yes       Intervention Provide education, explanation, and written materials on patient's individual exercise prescription       Expected Outcomes Short Term: Able to explain program exercise prescription;Long Term: Able to explain home exercise prescription to exercise independently                Exercise Goals Re-Evaluation:  Exercise Goals Re-Evaluation     Row Name 12/15/23 1634 12/26/23 1634           Exercise Goal Re-Evaluation   Exercise Goals Review Increase Physical Activity;Understanding of Exercise Prescription;Increase Strength and Stamina;Knowledge and understanding of Target Heart Rate Range (THRR);Able to understand and use rate of perceived exertion (RPE) scale Increase Physical Activity;Understanding of Exercise Prescription;Increase Strength and Stamina;Knowledge and understanding of Target Heart Rate Range (THRR);Able to understand and use rate of perceived exertion (RPE) scale      Comments Megan Pham first day in the [facility] program. Megan Pham tolerated exercise well with an average MET level of 1.75. Megan Pham is learning her THRR, RPE and ExRx. She's off to a good start Reviewed MET's and goals. Megan Pham tolerated exercise well with an average MET level of 1.95. Megan Pham is feeling good abuot her goals and feels an increase in strength and stamina. Her SOB is still a challenge, but she has an appt with Pulm to go over this. She has a friend group that meets at the [facility] at the same time as her exercise here, so she has a good routine for graduation. Will review  more about home exercise following Pulm appt      Expected Outcomes Will continue to monitor Megan Pham and progress workloads as tolerated without sign or symptom Will continue to monitor Megan Pham and progress workloads as tolerated without sign or symptom               Nutrition & Weight - Outcomes:  Pre Biometrics - 12/10/23 1410       Pre Biometrics   Waist Circumference 42.5 inches    Hip Circumference 40 inches    Waist to Hip Ratio 1.06 %    Triceps Skinfold 34 mm    % Body Fat 46.5 %    Grip Strength 20 kg    Flexibility 12.5 in    Single Leg Stand 3 seconds              Nutrition:  Nutrition Therapy & Goals - 01/16/24 1422       Nutrition Therapy  Diet Heart Healthy Diet    Drug/Food Interactions Statins/Certain Fruits      Personal Nutrition Goals   Nutrition Goal Patient to identify strategies for reducing cardiovascular risk by attending the [facility] education and nutrition series weekly.    Personal Goal #2 Patient to improve diet quality by using the plate method as a guide for meal planning to include lean protein/plant protein, fruits, vegetables, whole grains, nonfat dairy as part of a well-balanced diet.    Comments Patient has decided not to complete/finish intensive cardiac rehab; see phone message 01/19/2024.  Megan Pham has medical history of CABGx4, HTN, CAD, dyslipidemia. A1c is in a prediabetic range. Lipids are well controlled at goal. Have discussed strategies for weight loss including calorie density, the plate method, mindful eating, etc. She has maintained her weight since starting with our program. Patient will benefit from adherence to nutrition, exercise, and lifestyle modifications.      Intervention Plan   Intervention Prescribe, educate and counsel regarding individualized specific dietary modifications aiming towards targeted core components such as weight, hypertension, lipid management, diabetes, heart failure and other comorbidities.;Nutrition  handout(s) given to patient.    Expected Outcomes Short Term Goal: Understand basic principles of dietary content, such as calories, fat, sodium, cholesterol and nutrients.;Long Term Goal: Adherence to prescribed nutrition plan.             Nutrition Discharge:  Nutrition Assessments - 01/02/24 1134       MEDFICTS Scores   Pre Score 64             Education Questionnaire Score:  Knowledge Questionnaire Score - 12/10/23 1419       Knowledge Questionnaire Score   Pre Score 24/24             Megan Pham attended 32 exercise and education sessions between 12/10/23-01/16/24. Megan Pham did well with exercise but ultimately decided to end participation in the program to exercise independently at the [facility].Monte Antonio RN BSN

## 2024-01-30 ENCOUNTER — Encounter (HOSPITAL_COMMUNITY)

## 2024-02-04 ENCOUNTER — Encounter (HOSPITAL_COMMUNITY)

## 2024-02-06 ENCOUNTER — Encounter (HOSPITAL_COMMUNITY)

## 2024-02-09 ENCOUNTER — Encounter (HOSPITAL_COMMUNITY)

## 2024-02-11 ENCOUNTER — Encounter (HOSPITAL_COMMUNITY)

## 2024-02-13 ENCOUNTER — Encounter (HOSPITAL_COMMUNITY)

## 2024-02-16 ENCOUNTER — Encounter (HOSPITAL_COMMUNITY)

## 2024-02-18 ENCOUNTER — Ambulatory Visit: Attending: Cardiology | Admitting: Cardiology

## 2024-02-18 ENCOUNTER — Encounter: Payer: Self-pay | Admitting: Cardiology

## 2024-02-18 ENCOUNTER — Encounter (HOSPITAL_COMMUNITY)

## 2024-02-18 VITALS — BP 148/83 | HR 67 | Resp 16 | Ht 60.0 in | Wt 163.2 lb

## 2024-02-18 DIAGNOSIS — Z951 Presence of aortocoronary bypass graft: Secondary | ICD-10-CM

## 2024-02-18 DIAGNOSIS — E7800 Pure hypercholesterolemia, unspecified: Secondary | ICD-10-CM

## 2024-02-18 DIAGNOSIS — I10 Essential (primary) hypertension: Secondary | ICD-10-CM

## 2024-02-18 DIAGNOSIS — I9789 Other postprocedural complications and disorders of the circulatory system, not elsewhere classified: Secondary | ICD-10-CM | POA: Diagnosis not present

## 2024-02-18 DIAGNOSIS — I6522 Occlusion and stenosis of left carotid artery: Secondary | ICD-10-CM

## 2024-02-18 DIAGNOSIS — I2510 Atherosclerotic heart disease of native coronary artery without angina pectoris: Secondary | ICD-10-CM

## 2024-02-18 DIAGNOSIS — I4891 Unspecified atrial fibrillation: Secondary | ICD-10-CM

## 2024-02-18 MED ORDER — LOSARTAN POTASSIUM 25 MG PO TABS: 50.0000 mg | ORAL_TABLET | Freq: Every day | ORAL | Status: DC

## 2024-02-18 NOTE — Progress Notes (Signed)
 Cardiology Office Note:    Date:  02/18/2024  NAME:  Megan Pham    MRN: [id number] DOB:  1946/05/31   PCP:  Claud Crumb, MD  Former Cardiology Providers: NA Primary Cardiologist:  Olinda Bertrand, DO, [facility] (established care 07/03/2023) Electrophysiologist:  None   Chief Complaint  Patient presents with   Coronary artery disease involving native coronary artery of   Follow-up    History of Present Illness:    Megan Pham is a 78 y.o. Caucasian female whose past medical history and cardiovascular risk factors includes: Severe coronary artery calcification, coronary artery disease per CCTA and s/p 4v CABG (L-LAD, S-PDA, S-OM1, S-OM2) , postop atrial fibrillation (treated with amiodarone  for 90 days), hypertension, dyslipidemia, carotid artery disease, former smoker.   Patient was referred to the practice for evaluation of carotid disease and shortness of breath.  Shortness of breath felt to be driven by possible cardiac etiology and therefore was recommended to undergo echocardiogram as well as coronary CTA.  Echocardiogram noted preserved LVEF, and coronary CTA noted multivessel CAD.    She underwent left heart catheterization was noted to have obstructive multivessel CAD and referred to cardiothoracic surgery and underwent four-vessel bypass with LIMA to the LAD, SVG to PDA, SVG to OM1, SVG to OM2 with Dr. Deloise Ferries.  Postoperative course was complicated by left pneumothorax with subcutaneous emphysema requiring chest tube placement, pleural effusion status postthoracentesis and postop atrial fibrillation.  Patient was placed on amiodarone  for 90 days postop and has remained in sinus rhythm.  She presents today for 78-month follow-up visit. She denies anginal chest pain or heart failure symptoms. She was going to the cardiac rehab but felt that she could do more and therefore chose to discontinue going cardiac rehab.  She is going to the [facility] doing water aerobics and rowing  exercises. She was informed to discontinue amiodarone  in 90 days, but continues to take it. At the last office visit she was started on losartan  25 mg p.o. daily given her hypertension.  She is able to tolerate the medication well.  Home SBP range between 140-170 mmHg. Patient is accompanied by her friend June who provides collateral history and patient provides verbal consent having her present during today's encounter as well.  Current Medications: Current Meds  Medication Sig   aspirin  EC 81 MG tablet Take 1 tablet (81 mg total) by mouth daily. Swallow whole.   diphenhydramine -acetaminophen  (TYLENOL  PM) 25-500 MG TABS tablet Take 2 tablets by mouth at bedtime.   metoprolol  succinate (TOPROL -XL) 25 MG 24 hr tablet Take 1 tablet (25 mg total) by mouth daily.   rosuvastatin  (CRESTOR ) 40 MG tablet Take 1 tablet (40 mg total) by mouth daily.   traMADol  (ULTRAM ) 50 MG tablet Take 1 tablet (50 mg total) by mouth every 6 (six) hours as needed for moderate pain (pain score 4-6).   [DISCONTINUED] amiodarone  (PACERONE ) 100 MG tablet Take 1 tablet (100 mg total) by mouth daily.   [DISCONTINUED] losartan  (COZAAR ) 25 MG tablet Take 1 tablet (25 mg total) by mouth daily. HOLD is systolic blood pressure (top number) is less than 120.     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  Dyslipidemia    Dyspnea    Family history of breast cancer 05/27/2022   Family history of gastric cancer 05/27/2022   HTN (hypertension)    Hyperlipidemia    Osteoarthritis    Osteopenia    Poison ivy    Pseudoptosis, unspecified laterality    Ulcer 1979   ulcerative colitis    Past Surgical History: Past Surgical History:  Procedure Laterality Date   ABDOMINAL  HYSTERECTOMY  1973   Partial hysterectomy   APPENDECTOMY  1973   CHOLECYSTECTOMY  2000   COLONOSCOPY  2004   by Dr.James Weissman-had left sided diverticulosis   CORONARY ARTERY BYPASS GRAFT N/A 10/06/2023   Procedure: CORONARY ARTERY BYPASS GRAFTING (CABG) TIMES FOUR USING LEFT INTERNAL MAMMARY ARTERY AND BILATERALLY ENDOSCOPICALLY HARVESTED GREATER SAPHENOUS VEINS;  Surgeon: Hilarie Lovely, MD;  Location: [facility] OR;  Service: Open Heart Surgery;  Laterality: N/A;   HAMMER TOE SURGERY  2004   HERNIA REPAIR     IR THORACENTESIS ASP PLEURAL SPACE W/IMG GUIDE  10/15/2023   LEFT HEART CATH AND CORONARY ANGIOGRAPHY N/A 08/26/2023   Procedure: LEFT HEART CATH AND CORONARY ANGIOGRAPHY;  Surgeon: Arnoldo Lapping, MD;  Location: [facility] INVASIVE CV LAB;  Service: Cardiovascular;  Laterality: N/A;   TEE WITHOUT CARDIOVERSION N/A 10/06/2023   Procedure: TRANSESOPHAGEAL ECHOCARDIOGRAM (TEE);  Surgeon: Hilarie Lovely, MD;  Location: [facility] OR;  Service: Open Heart Surgery;  Laterality: N/A;   TONSILLECTOMY  1970    Social History: Social History   Tobacco Use   Smoking status: Former    Current packs/day: 0.00    Types: Cigarettes    Quit date: 09/09/2006    Years since quitting: 17.4   Smokeless tobacco: Never  Vaping Use   Vaping status: Never Used  Substance Use Topics   Alcohol use: No   Drug use: No    Family History: Family History  Problem Relation Age of Onset   Arthritis Mother    Cancer Mother        lung   Arthritis Father    Cancer Father        throat and lung; d. 40   Breast cancer Sister 40   Other Sister        CTNNA1 mutation   Gastric cancer Sister        dx 82s; ? colon cancer   Arthritis Maternal Aunt    Breast cancer Maternal Aunt        dx after 50   Arthritis Maternal Uncle    Cancer Maternal Uncle        ? behind ear   Arthritis Paternal Aunt    Arthritis Paternal Uncle    Arthritis Maternal Grandmother    Arthritis Maternal Grandfather    Cancer  Maternal Grandfather        mouth   Arthritis Paternal Grandmother    Breast cancer Paternal Grandmother        d. 30s   Arthritis Paternal Grandfather    Cancer Paternal Grandfather        unknown type   Arthritis Son    Healthy Grandchild        x 2   Breast cancer Cousin        d. 39   Colon cancer Neg Hx     ROS:   Review of Systems  Cardiovascular:  Negative for chest pain, claudication, irregular heartbeat, leg swelling, near-syncope, orthopnea, palpitations, paroxysmal nocturnal dyspnea and syncope.  Respiratory:  Negative for shortness  of breath.   Hematologic/Lymphatic: Negative for bleeding problem.    EKGs/Labs/Other Studies Reviewed:        Echo: November 21st 2024 LVEF: 60-65% Diastolic Function: Grade 1 No significant valvular heart disease. Ascending aorta 39 mm Estimated RAP 3 mmHg   CCTA 07/30/2023 1. Significant diffuse CAD with CTO noted, CADRADS = 5. CT FFR will be performed and reported separately. Diffuse moderate disease in LAD and LCx, with OM3 modeled as CTO. RCA with 50-69% stenosis in proximal and mid vessel, and 70-99% stenosis in distal vessel. Distal RCA appears CTO.  2. Coronary calcium  score of 1364. This was 95th percentile for age-, sex-, and race- matched controls.  3. Total plaque volume 974 mm3 which is 86th percentile for age- and sex- matched controls (calcified plaque 220 mm3; noncalcified plaque 754 mm3). Total plaque volume is extensive.  4. Normal coronary origin with right dominance.  CT-FFR: Nov 2024 1. CT FFR analysis did not show any significant focal stenosis, but distal RCA and OM3 modeled as CTO. Distal LAD caliber is too small to be evaluated.  Carotid Duplex  06/23/2023 at [facility] see Care Everywhere.  1.  Prominent soft plaque within the mid left common carotid artery with associated stenosis at this level and velocity elevation. This is concerning for a clinically significant stenosis.  2.  Proximal left ICA  velocity suggesting 50-69% stenosis  3.  No evidence of a hemodynamically significant right-sided carotid stenosis.  4.  Both vertebral arteries are patent with antegrade flow.  5.  Consider carotid CTA scan for further evaluation of these findings.   LHC  08/26/2023 1.  Patent left main with no significant stenosis 2.  Total occlusion of the RCA with left-to-right collaterals 3.  Severe stenosis of the proximal and mid LAD with 80% lesions in both portions of the vessel 4.  Severe stenosis of the mid circumflex extending into a large OM 2 branch with total occlusion of the OM1 supplied by left to left collaterals   Recommendations: Patient with diffuse calcific multivessel coronary artery disease including total occlusion of the dominant RCA.  Favor surgical referral for CABG.  Plan discussed with the patient who is agreeable to proceed.  Vascular CABG US  09/18/2023 Right Carotid: Velocities in the right ICA are consistent with a 1-39% stenosis.   Left Carotid: Velocities in the left ICA are consistent with a 40-59% stenosis. Hemodynamically significant plaque >50% visualized in the CCA. The ECA appears >50% stenosed.  Vertebrals:  Bilateral vertebral arteries demonstrate antegrade flow.  Subclavians: Normal flow hemodynamics were seen in bilateral subclavian arteries.   Right ABI: Resting right ankle-brachial index is within normal range. The right toe-brachial index is normal with abnormal waveform.  Left ABI: Resting left ankle-brachial index is within normal range. The left toe-brachial index is mildly reduced with abnormal waveform.  Right Upper Extremity: Doppler waveforms decrease 50% with right radial  compression. Doppler waveforms decrease >50% with right ulnar compression.  Left Upper Extremity: Doppler waveforms remain within normal limits with  left radial compression. Doppler waveforms remain within normal limits with left ulnar compression.   Labs:    Latest Ref Rng &  Units 10/13/2023    3:35 AM 10/12/2023    3:18 AM 10/11/2023    3:26 AM  CBC  WBC 4.0 - 10.5 K/uL 9.8  10.6  8.6   Hemoglobin 12.0 - 15.0 g/dL 9.1  9.1  8.8   Hematocrit 36.0 - 46.0 % 27.3  27.2  26.7  Platelets 150 - 400 K/uL 308  299  235        Latest Ref Rng & Units 11/19/2023    3:25 PM 10/24/2023   10:52 AM 10/24/2023   10:47 AM  BMP  Glucose 70 - 99 mg/dL 478  295  621   BUN 8 - 27 mg/dL 14  14  14    Creatinine 0.57 - 1.00 mg/dL 3.08  6.57  8.46   BUN/Creat Ratio 12 - 28 18  18  19    Sodium 134 - 144 mmol/L 139  140  141   Potassium 3.5 - 5.2 mmol/L 4.3  5.2  5.2   Chloride 96 - 106 mmol/L 102  103  103   CO2 20 - 29 mmol/L 21  22  22    Calcium  8.7 - 10.3 mg/dL 9.4  9.5  9.6       Latest Ref Rng & Units 11/19/2023    3:25 PM 10/24/2023   10:52 AM 10/24/2023   10:47 AM  CMP  Glucose 70 - 99 mg/dL 962  952  841   BUN 8 - 27 mg/dL 14  14  14    Creatinine 0.57 - 1.00 mg/dL 3.24  4.01  0.27   Sodium 134 - 144 mmol/L 139  140  141   Potassium 3.5 - 5.2 mmol/L 4.3  5.2  5.2   Chloride 96 - 106 mmol/L 102  103  103   CO2 20 - 29 mmol/L 21  22  22    Calcium  8.7 - 10.3 mg/dL 9.4  9.5  9.6   Total Protein 6.0 - 8.5 g/dL   6.9   Total Bilirubin 0.0 - 1.2 mg/dL   0.3   Alkaline Phos 44 - 121 IU/L   138   AST 0 - 40 IU/L   27   ALT 0 - 32 IU/L   28     Lab Results  Component Value Date   CHOL 121 10/24/2023   HDL 53 10/24/2023   LDLCALC 47 10/24/2023   LDLDIRECT 187.4 10/14/2013   TRIG 120 10/24/2023   CHOLHDL 2.3 10/24/2023   No results for input(s): LIPOA in the last 8760 hours. No components found for: NTPROBNP No results for input(s): PROBNP in the last 8760 hours. No results for input(s): TSH in the last 8760 hours.    Physical Exam:    Today's Vitals   02/18/24 0953  BP: (!) 148/83  Pulse: 67  Resp: 16  SpO2: 95%  Weight: 163 lb 3.2 oz (74 kg)  Height: 5' (1.524 m)   Body mass index is 31.87 kg/m. Wt Readings from Last 3 Encounters:  02/18/24  163 lb 3.2 oz (74 kg)  12/10/23 163 lb 5.8 oz (74.1 kg)  11/19/23 163 lb (73.9 kg)    Physical Exam  Constitutional: No distress.  hemodynamically stable  Neck: No JVD present.  Cardiovascular: Normal rate, regular rhythm, S1 normal, S2 normal, intact distal pulses and normal pulses. Exam reveals no gallop, no S3 and no S4.  No murmur heard. Pulses:      Carotid pulses are  on the left side with bruit. Pulmonary/Chest: Effort normal and breath sounds normal. No stridor. She has no wheezes. She has no rales.  Sternotomy site is well-healed, close incision marks in the upper abdomen well-healed, no evidence of ecchymosis.  Abdominal: Soft. Bowel sounds are normal. She exhibits no distension. There is no abdominal tenderness.  Musculoskeletal:        General:  No edema.     Cervical back: Neck supple.  Neurological: She is alert and oriented to person, place, and time. She has intact cranial nerves (2-12).  Skin: Skin is warm and moist.   Impression & Recommendation(s):  Impression:   ICD-10-CM   1. Coronary artery disease involving native coronary artery of native heart without angina pectoris  I25.10     2. Hx of CABG  Z95.1     3. Essential hypertension  I10 losartan  (COZAAR ) 25 MG tablet    Basic metabolic panel with GFR    Basic metabolic panel with GFR    4. Postoperative atrial fibrillation (HCC)  I97.89    I48.91     5. Stenosis of left carotid artery  I65.22     6. Pure hypercholesterolemia  E78.00       Recommendation(s):  Coronary artery disease involving native coronary artery of native heart without angina pectoris Hx of CABG Total CAC 1364, 95th percentile. Multivessel CAD concerns for chronic total occlusion on coronary CTA. Underwent left heart catheterization which redemonstrated multivessel CAD. Underwent four-vessel bypass in January 2025. Postoperative course complicated by pneumothorax with subcutaneous emphysema requiring chest tube placement, pleural  effusion status post thoracentesis, and postop A-fib. She was not to continue with cardiac rehab. Overall functional capacity remains stable. No anginal discomfort or heart failure symptoms Continue Toprol -XL 25 mg p.o. every morning. Increase losartan  to 50 mg p.o. daily. Continue aspirin  81 mg p.o. daily Discontinue amiodarone .  Was placed on it due to postop A-fib for the first 90 days.  She has completed the 90-day course. Reemphasized the importance of secondary prevention with focus on improving her modifiable cardiovascular risk factors such as glycemic control, lipid management, blood pressure control, weight loss.  Essential hypertension Office blood pressure not at goal. Increase losartan  to 50 mg p.o. daily. She currently has enough 25 mg tablets which she will take 2 of them until she runs out.   She will have repeat BMP in 1 week to reevaluate kidney function and electrolytes  Encouraged him to check ambulatory blood pressure readings to see if further medication titration is warranted she can follow-up with cardiology or PCP for BP management   Postoperative atrial fibrillation (HCC) Rate control: Metoprolol . Rhythm control: Recommend discontinuation of amiodarone  as she is completed 90-day course Thromboembolic prophylaxis: Aspirin  Will monitor clinically.  Stenosis of left carotid artery Will need to follow-up carotid duplex in January 2026 sooner if change in clinical status-still need to be ordered Continue aspirin  and statin therapy  Pure hypercholesterolemia Her LDL as of February 2023 was 154 mg/dL. Currently on Crestor  40 mg p.o. daily Follow-up lipids in February 2025 illustrate an LDL level of 47 mg/dL, currently at goal. Continue medical therapy. Does not endorse myalgias.  Orders Placed:  Orders Placed This Encounter  Procedures   Basic metabolic panel with GFR    Standing Status:   Future    Number of Occurrences:   1    Expected Date:   02/25/2024     Expiration Date:   02/17/2025   Final Medication List:    Meds ordered this encounter  Medications   losartan  (COZAAR ) 25 MG tablet    Sig: Take 2 tablets (50 mg total) by mouth daily. HOLD is systolic blood pressure (top number) is less than 120.     Current Outpatient Medications:    aspirin  EC 81 MG tablet, Take 1 tablet (81 mg total) by mouth daily. Swallow whole., Disp: ,  Rfl:    diphenhydramine -acetaminophen  (TYLENOL  PM) 25-500 MG TABS tablet, Take 2 tablets by mouth at bedtime., Disp: , Rfl:    metoprolol  succinate (TOPROL -XL) 25 MG 24 hr tablet, Take 1 tablet (25 mg total) by mouth daily., Disp: 90 tablet, Rfl: 3   rosuvastatin  (CRESTOR ) 40 MG tablet, Take 1 tablet (40 mg total) by mouth daily., Disp: 90 tablet, Rfl: 3   traMADol  (ULTRAM ) 50 MG tablet, Take 1 tablet (50 mg total) by mouth every 6 (six) hours as needed for moderate pain (pain score 4-6)., Disp: 28 tablet, Rfl: 0   losartan  (COZAAR ) 25 MG tablet, Take 2 tablets (50 mg total) by mouth daily. HOLD is systolic blood pressure (top number) is less than 120., Disp: , Rfl:   Consent:   NA  Disposition:   63-month follow-up with APP. 1 year follow-up with myself  Her questions and concerns were addressed to her satisfaction. She voices understanding of the recommendations provided during this encounter.   Signed, Awilda Bogus, [facility]  A Division of [facility] [address] [place], [place] [address]  [place], [place] [address]  02/18/2024 10:49 AM

## 2024-02-18 NOTE — Patient Instructions (Addendum)
 Medication Instructions:  STOP Amiodarone    INCREASE Losartan  to 50 mg (2 of the 25 mg tablets) once daily in the morning  *If you need a refill on your cardiac medications before your next appointment, please call your pharmacy*  Lab Work: To be completed in 1 week: BMP  If you have labs (blood work) drawn today and your tests are completely normal, you will receive your results only by: MyChart Message (if you have MyChart) OR A paper copy in the mail If you have any lab test that is abnormal or we need to change your treatment, we will call you to review the results.  Testing/Procedures: None ordered today.  Follow-Up: At [facility], you and your health needs are our priority.  As part of our continuing mission to provide you with exceptional heart care, our providers are all part of one team.  This team includes your primary Cardiologist (physician) and Advanced Practice Providers or APPs (Physician Assistants and Nurse Practitioners) who all work together to provide you with the care you need, when you need it.  Your next appointment:   6 month(s)  Provider:   One of our Advanced Practice Providers (APPs): Melita Springer, PA-C  Friddie Jetty, NP Evaline Hill, NP  Theotis Flake, PA-C Lawana Pray, NP  Willis Harter, PA-C Lovette Rud, PA-C  Gilbert, PA-C Ernest Dick, NP  Marlana Silvan, NP Marcie Sever, PA-C  Laquita Plant, PA-C    Dayna Dunn, PA-C  Scott Weaver, PA-C Palmer Bobo, NP Katlyn West, NP Callie Goodrich, PA-C  Evan Williams, PA-C Sheng Haley, PA-C  Xika Zhao, NP Kathleen Johnson, PA-C   Then, Sunit Tolia, DO will plan to see you again in 1 year(s).    We recommend signing up for the patient portal called MyChart.  Sign up information is provided on this After Visit Summary.  MyChart is used to connect with patients for Virtual Visits (Telemedicine).  Patients are able to view lab/test results, encounter notes, upcoming appointments, etc.   Non-urgent messages can be sent to your provider as well.   To learn more about what you can do with MyChart, go to [email or web address].   Other Instructions Call your primary care providers office or our office if your systolic blood pressure (top number) is greater than 130.

## 2024-02-20 ENCOUNTER — Encounter (HOSPITAL_COMMUNITY)

## 2024-02-23 ENCOUNTER — Encounter (HOSPITAL_COMMUNITY)

## 2024-02-24 ENCOUNTER — Ambulatory Visit: Payer: Self-pay | Admitting: Cardiology

## 2024-02-24 LAB — BASIC METABOLIC PANEL WITH GFR
BUN/Creatinine Ratio: 19 (ref 12–28)
BUN: 15 mg/dL (ref 8–27)
CO2: 19 mmol/L — ABNORMAL LOW (ref 20–29)
Calcium: 9.3 mg/dL (ref 8.7–10.3)
Chloride: 103 mmol/L (ref 96–106)
Creatinine, Ser: 0.79 mg/dL (ref 0.57–1.00)
Glucose: 100 mg/dL — ABNORMAL HIGH (ref 70–99)
Potassium: 5 mmol/L (ref 3.5–5.2)
Sodium: 137 mmol/L (ref 134–144)
eGFR: 77 mL/min/{1.73_m2} (ref >59)

## 2024-02-25 ENCOUNTER — Encounter (HOSPITAL_COMMUNITY)

## 2024-02-27 ENCOUNTER — Encounter (HOSPITAL_COMMUNITY)

## 2024-03-01 ENCOUNTER — Encounter (HOSPITAL_COMMUNITY)

## 2024-03-03 ENCOUNTER — Other Ambulatory Visit: Payer: Self-pay | Admitting: Cardiology

## 2024-03-03 ENCOUNTER — Encounter (HOSPITAL_COMMUNITY)

## 2024-03-03 DIAGNOSIS — I10 Essential (primary) hypertension: Secondary | ICD-10-CM

## 2024-03-05 MED ORDER — LOSARTAN POTASSIUM 25 MG PO TABS: 50.0000 mg | ORAL_TABLET | Freq: Every day | ORAL | 3 refills | Status: DC

## 2024-03-09 ENCOUNTER — Other Ambulatory Visit: Payer: Self-pay

## 2024-03-09 ENCOUNTER — Telehealth: Payer: Self-pay | Admitting: Cardiology

## 2024-03-09 DIAGNOSIS — I10 Essential (primary) hypertension: Secondary | ICD-10-CM

## 2024-03-09 MED ORDER — LOSARTAN POTASSIUM 50 MG PO TABS: 50.0000 mg | ORAL_TABLET | Freq: Every day | ORAL | 3 refills | Status: AC

## 2024-03-09 NOTE — Telephone Encounter (Signed)
 Pt c/o medication issue:  1. Name of Medication:   losartan  (COZAAR ) 25 MG tablet   2. How are you currently taking this medication (dosage and times per day)?   As prescribed  3. Are you having a reaction (difficulty breathing--STAT)?   4. What is your medication issue?   Patient is calling to report to Dr. Michele that she is almost finished with this medication and will need new prescription increasing her dosage to 50 mg sent to her pharmacy - [facility] [phone] - [place], [place] - [address]

## 2024-03-09 NOTE — Telephone Encounter (Signed)
 Spoke with pt over the phone to let her know we have sent in Losartan  50 mg to her preferred pharmacy. Pt verbalized understanding of plan and had no further questions.

## 2024-03-23 LAB — HM MAMMOGRAPHY

## 2024-03-23 LAB — HM DEXA SCAN

## 2024-03-29 ENCOUNTER — Encounter: Payer: Self-pay | Admitting: Family Medicine

## 2024-04-01 ENCOUNTER — Other Ambulatory Visit: Payer: Self-pay | Admitting: Cardiology

## 2024-04-01 DIAGNOSIS — I10 Essential (primary) hypertension: Secondary | ICD-10-CM

## 2024-07-21 ENCOUNTER — Other Ambulatory Visit: Payer: Self-pay | Admitting: Cardiology

## 2024-07-21 DIAGNOSIS — I6522 Occlusion and stenosis of left carotid artery: Secondary | ICD-10-CM

## 2024-07-21 DIAGNOSIS — E785 Hyperlipidemia, unspecified: Secondary | ICD-10-CM

## 2024-08-08 ENCOUNTER — Other Ambulatory Visit: Payer: Self-pay | Admitting: Cardiology

## 2024-08-08 DIAGNOSIS — I10 Essential (primary) hypertension: Secondary | ICD-10-CM

## 2024-08-08 DIAGNOSIS — I25118 Atherosclerotic heart disease of native coronary artery with other forms of angina pectoris: Secondary | ICD-10-CM
